# Patient Record
Sex: Male | Born: 1950 | Race: Black or African American | Hispanic: No | Marital: Single | Smoking: Current every day smoker
Health system: Southern US, Community
[De-identification: ages and names within clinical notes are randomized; demographics above are authoritative.]

## PROBLEM LIST (undated history)

## (undated) DIAGNOSIS — K852 Alcohol induced acute pancreatitis without necrosis or infection: Secondary | ICD-10-CM

## (undated) DIAGNOSIS — D649 Anemia, unspecified: Secondary | ICD-10-CM

## (undated) DIAGNOSIS — C801 Malignant (primary) neoplasm, unspecified: Secondary | ICD-10-CM

## (undated) DIAGNOSIS — S42302A Unspecified fracture of shaft of humerus, left arm, initial encounter for closed fracture: Secondary | ICD-10-CM

## (undated) DIAGNOSIS — M1990 Unspecified osteoarthritis, unspecified site: Secondary | ICD-10-CM

## (undated) DIAGNOSIS — K8689 Other specified diseases of pancreas: Secondary | ICD-10-CM

## (undated) DIAGNOSIS — M722 Plantar fascial fibromatosis: Secondary | ICD-10-CM

## (undated) DIAGNOSIS — I10 Essential (primary) hypertension: Secondary | ICD-10-CM

## (undated) HISTORY — DX: Alcohol induced acute pancreatitis without necrosis or infection: K85.20

## (undated) HISTORY — PX: PROSTATE BIOPSY: SHX241

## (undated) HISTORY — DX: Other specified diseases of pancreas: K86.89

## (undated) HISTORY — DX: Anemia, unspecified: D64.9

## (undated) HISTORY — DX: Plantar fascial fibromatosis: M72.2

---

## 1999-12-06 ENCOUNTER — Emergency Department (HOSPITAL_COMMUNITY): Admission: EM | Admit: 1999-12-06 | Discharge: 1999-12-06 | Payer: Self-pay | Admitting: Emergency Medicine

## 1999-12-06 ENCOUNTER — Encounter: Payer: Self-pay | Admitting: Emergency Medicine

## 2002-02-01 ENCOUNTER — Emergency Department (HOSPITAL_COMMUNITY): Admission: EM | Admit: 2002-02-01 | Discharge: 2002-02-01 | Payer: Self-pay | Admitting: Emergency Medicine

## 2002-02-01 ENCOUNTER — Encounter: Payer: Self-pay | Admitting: Emergency Medicine

## 2005-05-20 ENCOUNTER — Emergency Department (HOSPITAL_COMMUNITY): Admission: EM | Admit: 2005-05-20 | Discharge: 2005-05-20 | Payer: Self-pay | Admitting: Emergency Medicine

## 2006-07-08 ENCOUNTER — Emergency Department (HOSPITAL_COMMUNITY): Admission: EM | Admit: 2006-07-08 | Discharge: 2006-07-09 | Payer: Self-pay | Admitting: Emergency Medicine

## 2008-03-24 ENCOUNTER — Emergency Department (HOSPITAL_COMMUNITY): Admission: EM | Admit: 2008-03-24 | Discharge: 2008-03-24 | Payer: Self-pay | Admitting: Emergency Medicine

## 2009-10-10 ENCOUNTER — Encounter: Admission: RE | Admit: 2009-10-10 | Discharge: 2009-10-10 | Payer: Self-pay | Admitting: Family Medicine

## 2013-03-11 ENCOUNTER — Encounter (HOSPITAL_COMMUNITY): Payer: Self-pay | Admitting: Emergency Medicine

## 2013-03-11 ENCOUNTER — Emergency Department (HOSPITAL_COMMUNITY): Payer: Self-pay

## 2013-03-11 ENCOUNTER — Emergency Department (HOSPITAL_COMMUNITY)
Admission: EM | Admit: 2013-03-11 | Discharge: 2013-03-11 | Disposition: A | Discharge status: Home / Self Care | Payer: Self-pay | Attending: Emergency Medicine | Admitting: Emergency Medicine

## 2013-03-11 DIAGNOSIS — Y9301 Activity, walking, marching and hiking: Secondary | ICD-10-CM | POA: Insufficient documentation

## 2013-03-11 DIAGNOSIS — S52502A Unspecified fracture of the lower end of left radius, initial encounter for closed fracture: Secondary | ICD-10-CM

## 2013-03-11 DIAGNOSIS — Y929 Unspecified place or not applicable: Secondary | ICD-10-CM | POA: Insufficient documentation

## 2013-03-11 DIAGNOSIS — S52599A Other fractures of lower end of unspecified radius, initial encounter for closed fracture: Secondary | ICD-10-CM | POA: Insufficient documentation

## 2013-03-11 DIAGNOSIS — M129 Arthropathy, unspecified: Secondary | ICD-10-CM | POA: Insufficient documentation

## 2013-03-11 DIAGNOSIS — R296 Repeated falls: Secondary | ICD-10-CM | POA: Insufficient documentation

## 2013-03-11 DIAGNOSIS — F17200 Nicotine dependence, unspecified, uncomplicated: Secondary | ICD-10-CM | POA: Insufficient documentation

## 2013-03-11 DIAGNOSIS — R21 Rash and other nonspecific skin eruption: Secondary | ICD-10-CM | POA: Insufficient documentation

## 2013-03-11 HISTORY — DX: Unspecified osteoarthritis, unspecified site: M19.90

## 2013-03-11 MED ORDER — BUPIVACAINE HCL (PF) 0.5 % IJ SOLN
INTRAMUSCULAR | Status: AC
  Filled 2013-03-11: qty 30

## 2013-03-11 MED ORDER — OXYCODONE-ACETAMINOPHEN 5-325 MG PO TABS: 1.0000 | ORAL_TABLET | Freq: Four times a day (QID) | ORAL | Status: DC | PRN

## 2013-03-11 MED ORDER — MORPHINE SULFATE 4 MG/ML IJ SOLN
4.0000 mg | Freq: Once | INTRAMUSCULAR | Status: AC
  Administered 2013-03-11: 4 mg via INTRAVENOUS
  Filled 2013-03-11: qty 1

## 2013-03-11 MED ORDER — BUPIVACAINE HCL (PF) 0.5 % IJ SOLN
30.0000 mL | Freq: Once | INTRAMUSCULAR | Status: AC
  Administered 2013-03-11: 30 mL

## 2013-03-11 NOTE — ED Notes (Signed)
WUJ:[id number]<JY> Expected date:<BR> Expected time:<BR> Means of arrival:<BR> Comments:<BR> Hold for Rada

## 2013-03-11 NOTE — ED Notes (Signed)
Pt c/o l wrist injury.  Reports falling and landing on hands.  Obvious wrist deformity and swelling noted to L wrist.  Pt is able to move fingers.

## 2013-03-11 NOTE — ED Notes (Signed)
Patient is alert and oriented x3.  He was given DC instructions and follow up visit instructions.  Patient gave verbal understanding.  He was DC ambulatory under his own power to home.  V/S stable.  He was not showing any signs of distress on DC 

## 2013-03-11 NOTE — ED Provider Notes (Signed)
History    CSN: [id number] Arrival date & time 03/11/13  1948  First MD Initiated Contact with Patient 03/11/13 1955     Chief Complaint  Patient presents with  . Wrist Injury   (Consider location/radiation/quality/duration/timing/severity/associated sxs/prior Treatment) Patient is a 62 y.o. male presenting with wrist injury. The history is provided by the patient and the EMS personnel.  Wrist Injury Associated symptoms: no back pain and no fatigue    patient states that he was eating chicken and walking backwards and fell and landed on his arm. He has a deformity. He states he did not hit his head. He has had a few beers today. No other injury. No chest or neck pain. He is not on any anticoagulation. No numbness or weakness. Past Medical History  Diagnosis Date  . Arthritis    History reviewed. No pertinent past surgical history. No family history on file. History  Substance Use Topics  . Smoking status: Current Every Day Smoker  . Smokeless tobacco: Not on file  . Alcohol Use: Yes    Review of Systems  Constitutional: Negative for appetite change and fatigue.  Cardiovascular: Negative for chest pain.  Gastrointestinal: Negative for abdominal pain.  Musculoskeletal: Negative for back pain and joint swelling.  Skin: Positive for rash. Negative for wound.  Neurological: Negative for weakness and numbness.    Allergies  Review of patient's allergies indicates no known allergies.  Home Medications   Current Outpatient Rx  Name  Route  Sig  Dispense  Refill  . oxyCODONE-acetaminophen (PERCOCET/ROXICET) 5-325 MG per tablet   Oral   Take 1-2 tablets by mouth every 6 (six) hours as needed for pain.   12 tablet   0    BP 135/76  Pulse 63  Resp 18  SpO2 97% Physical Exam  Constitutional: He is oriented to person, place, and time. He appears well-developed and well-nourished.  HENT:  Head: Normocephalic.  Eyes: Pupils are equal, round, and reactive to light.   Cardiovascular: Normal rate and regular rhythm.   Pulmonary/Chest: Effort normal and breath sounds normal.  Abdominal: There is no tenderness.  Musculoskeletal: He exhibits tenderness.  Dorsal angulation of distal left radius. Sensation intact in hand over radial median and ulnar distribution. Good capillary refill. Skin is intact no tenderness over elbow. No tenderness over shoulder.  Neurological: He is alert and oriented to person, place, and time.  Skin: Skin is warm.    ED Course  ORTHOPEDIC INJURY TREATMENT Date/Time: 03/11/2013 11:09 PM Performed by: Benjiman Core R. Authorized by: Billee Cashing Consent: Verbal consent obtained. written consent not obtained. Risks and benefits: risks, benefits and alternatives were discussed Consent given by: patient Patient understanding: patient states understanding of the procedure being performed Patient consent: the patient's understanding of the procedure matches consent given Procedure consent: procedure consent matches procedure scheduled Relevant documents: relevant documents present and verified Test results: test results available and properly labeled Site marked: the operative site was marked Imaging studies: imaging studies available Required items: required blood products, implants, devices, and special equipment available Patient identity confirmed: verbally with patient and arm band Time out: Immediately prior to procedure a "time out" was called to verify the correct patient, procedure, equipment, support staff and site/side marked as required. Injury location: wrist Location details: left wrist Injury type: fracture Pre-procedure neurovascular assessment: neurovascularly intact Pre-procedure distal perfusion: normal Pre-procedure neurological function: normal Pre-procedure range of motion: reduced Local anesthesia used: yes Anesthesia: hematoma block Local anesthetic: bupivacaine 0.5%  without  epinephrine Anesthetic total: 3 ml Patient sedated: no Manipulation performed: yes Reduction successful: yes X-ray confirmed reduction: yes Immobilization: splint Splint type: sugar tong Post-procedure neurovascular assessment: post-procedure neurovascularly intact Post-procedure distal perfusion: normal Post-procedure neurological function: normal Post-procedure range of motion: improved Patient tolerance: Patient tolerated the procedure well with no immediate complications.   (including critical care time) Labs Reviewed - No data to display Dg Wrist Complete Left  03/11/2013   *RADIOLOGY REPORT*  Clinical Data: And left wrist.  LEFT WRIST - COMPLETE 3+ VIEW  Comparison: Radiography from earlier the same day.  Findings: Interval application of a splint.  Fracture impaction is much improved, as is  radial inclination.  There is mild dorsal tilting, much improved.  No new injury appreciated.  IMPRESSION: Interarticular distal radius fracture alignment is markedly improved after reduction and splinting.   Original Report Authenticated By: Tiburcio Pea   Dg Wrist Complete Left  03/11/2013   *RADIOLOGY REPORT*  Clinical Data: Left wrist.  Deformity.  LEFT WRIST - COMPLETE 3+ VIEW  Comparison: None.  Findings: Comminuted intra-articular distal radius fracture with dorsal angulation.  No other visible fractures.  Probable mild osseous demineralization.  IMPRESSION: Comminuted intra-articular distal radius fracture with dorsal angulation.   Original Report Authenticated By: Hulan Saas, M.D.   1. Distal radius fracture, left, closed, initial encounter     MDM  Patient with left intra-articular distal radius fracture. It is moderately displaced.  It was reduced and splinted in the ED. Will followup either with Dr. Merlyn Lot, who was on call, or Dr. Melvyn Novas who dealt with the previous wrist fracture for him.    Juliet Rude. Rubin Payor, MD 03/11/13 2311

## 2013-04-13 ENCOUNTER — Emergency Department (HOSPITAL_COMMUNITY): Payer: Self-pay

## 2013-04-13 ENCOUNTER — Encounter (HOSPITAL_COMMUNITY): Payer: Self-pay | Admitting: Nurse Practitioner

## 2013-04-13 ENCOUNTER — Emergency Department (HOSPITAL_COMMUNITY)
Admission: EM | Admit: 2013-04-13 | Discharge: 2013-04-13 | Disposition: A | Discharge status: Home / Self Care | Payer: Self-pay | Attending: Emergency Medicine | Admitting: Emergency Medicine

## 2013-04-13 DIAGNOSIS — F17200 Nicotine dependence, unspecified, uncomplicated: Secondary | ICD-10-CM | POA: Insufficient documentation

## 2013-04-13 DIAGNOSIS — S5290XD Unspecified fracture of unspecified forearm, subsequent encounter for closed fracture with routine healing: Secondary | ICD-10-CM | POA: Insufficient documentation

## 2013-04-13 DIAGNOSIS — S52502D Unspecified fracture of the lower end of left radius, subsequent encounter for closed fracture with routine healing: Secondary | ICD-10-CM

## 2013-04-13 DIAGNOSIS — Z8739 Personal history of other diseases of the musculoskeletal system and connective tissue: Secondary | ICD-10-CM | POA: Insufficient documentation

## 2013-04-13 HISTORY — DX: Unspecified fracture of shaft of humerus, left arm, initial encounter for closed fracture: S42.302A

## 2013-04-13 NOTE — ED Notes (Signed)
I gave the patient a cup of coffee and 2 sugars.

## 2013-04-13 NOTE — ED Provider Notes (Signed)
CSN: [id number]     Arrival date & time 04/13/13  1051 History     First MD Initiated Contact with Patient 04/13/13 1153     Chief Complaint  Patient presents with  . Cast Problem   (Consider location/radiation/quality/duration/timing/severity/associated sxs/prior Treatment) HPI Comments: Patient returns today for follow up for his left distal radius fracture 7/17.  States he was supposed to follow up with Dr Melvyn Novas but could not because he was asked for $250 up front to be seen.  States he has no pain, no weakness or numbness, and wants to see if it is healed.   The history is provided by the patient.    Past Medical History  Diagnosis Date  . Arthritis    History reviewed. No pertinent past surgical history. History reviewed. No pertinent family history. History  Substance Use Topics  . Smoking status: Current Every Day Smoker    Types: Cigarettes  . Smokeless tobacco: Not on file  . Alcohol Use: Yes    Review of Systems  Skin: Negative for color change.  Neurological: Negative for weakness and numbness.    Allergies  Review of patient's allergies indicates no known allergies.  Home Medications   Current Outpatient Rx  Name  Route  Sig  Dispense  Refill  . oxyCODONE-acetaminophen (PERCOCET/ROXICET) 5-325 MG per tablet   Oral   Take 1-2 tablets by mouth every 6 (six) hours as needed for pain.   12 tablet   0    BP 143/85  Pulse 76  Temp(Src) 98.3 F (36.8 C) (Oral)  Resp 15  Ht 6\' 3"  (1.905 m)  Wt 170 lb (77.111 kg)  BMI 21.25 kg/m2  SpO2 99% Physical Exam  Nursing note and vitals reviewed. Constitutional: He appears well-developed and well-nourished. No distress.  HENT:  Head: Normocephalic and atraumatic.  Neck: Neck supple.  Pulmonary/Chest: Effort normal.  Musculoskeletal:  Left fingers with full AROM without swelling.  Sensation intact.  Capillary refill < 2 seconds.   Neurological: He is alert.  Skin: He is not diaphoretic.  Skin of left  forearm and wrist under splint is intact.  No erythema, edema, break in skin, warmth.      ED Course   Procedures (including critical care time)  Labs Reviewed - No data to display Dg Wrist Complete Left  04/13/2013   *RADIOLOGY REPORT*  Clinical Data: Follow-up fracture.  Cast problem.  LEFT WRIST - COMPLETE 3+ VIEW  Comparison: 03/11/2013.  Findings: Cast material overlies an impacted distal radius fracture.  Fracture fragments are in a similar anatomic alignment with some blurring of the fracture margins and bridging callus formation.  Degenerative changes are seen in the first carpometacarpal and scaphoid trapezium trapezoid joints.  IMPRESSION: Impacted distal radius fracture without change in alignment, and some interval evidence of healing.   Original Report Authenticated By: Leanna Battles, M.D.   1. Distal radius fracture, left, closed, with routine healing, subsequent encounter     MDM  Pt with fracture seen in ED last month, has not followed up with hand because he cannot afford it.  No complaints.  Per xray, bones are healing.  Splint removed and replaced.  Pt first attempted to follow up with Dr Melvyn Novas because he had operated on his right wrist previously.  Dr Merlyn Lot was actually on call the night patient was seen.  D/C home with hand follow up Merlyn Lot).  Discussed all results with patient.  Pt given return precautions.  Pt verbalizes understanding and agrees  with plan.      Trixie Dredge, PA-C 04/13/13 1456

## 2013-04-13 NOTE — Progress Notes (Signed)
Orthopedic Tech Progress Note Patient Details:  Logan Chambers 1951-06-26 [id number]  Ortho Devices Type of Ortho Device: Sugartong splint;Ace wrap Ortho Device/Splint Interventions: Application   [name], [name] 04/13/2013, 1:47 PM

## 2013-04-13 NOTE — ED Notes (Signed)
Pt had cast placed for LFA fracture last month, has been unable to f/u with ortho because he has no money to get appt. Pt wants cast checked today to see if its ready to remove. Denies any pain.

## 2013-04-13 NOTE — ED Notes (Signed)
PT said he was heading to cafeteria; encouraged to stay until name called. Pt sitting in waiting room now.

## 2013-04-13 NOTE — ED Provider Notes (Signed)
Medical screening examination/treatment/procedure(s) were performed by non-physician practitioner and as supervising physician I was immediately available for consultation/collaboration.  Flint Melter, MD 04/13/13 [phone]

## 2013-05-06 ENCOUNTER — Ambulatory Visit: Payer: Self-pay | Attending: Orthopedic Surgery | Admitting: Occupational Therapy

## 2013-05-06 DIAGNOSIS — M2560 Stiffness of unspecified joint, not elsewhere classified: Secondary | ICD-10-CM | POA: Insufficient documentation

## 2013-05-06 DIAGNOSIS — M25549 Pain in joints of unspecified hand: Secondary | ICD-10-CM | POA: Insufficient documentation

## 2013-05-06 DIAGNOSIS — M6281 Muscle weakness (generalized): Secondary | ICD-10-CM | POA: Insufficient documentation

## 2013-05-06 DIAGNOSIS — IMO0001 Reserved for inherently not codable concepts without codable children: Secondary | ICD-10-CM | POA: Insufficient documentation

## 2013-05-13 ENCOUNTER — Ambulatory Visit: Payer: Self-pay | Admitting: Occupational Therapy

## 2013-05-20 ENCOUNTER — Ambulatory Visit: Payer: Self-pay | Admitting: Occupational Therapy

## 2013-05-26 ENCOUNTER — Ambulatory Visit: Payer: Self-pay | Attending: Orthopedic Surgery | Admitting: Occupational Therapy

## 2013-05-26 DIAGNOSIS — IMO0001 Reserved for inherently not codable concepts without codable children: Secondary | ICD-10-CM | POA: Insufficient documentation

## 2013-05-26 DIAGNOSIS — M6281 Muscle weakness (generalized): Secondary | ICD-10-CM | POA: Insufficient documentation

## 2013-05-26 DIAGNOSIS — M25549 Pain in joints of unspecified hand: Secondary | ICD-10-CM | POA: Insufficient documentation

## 2013-05-26 DIAGNOSIS — M2560 Stiffness of unspecified joint, not elsewhere classified: Secondary | ICD-10-CM | POA: Insufficient documentation

## 2013-06-03 ENCOUNTER — Ambulatory Visit: Payer: Self-pay | Admitting: Occupational Therapy

## 2013-06-10 ENCOUNTER — Ambulatory Visit: Payer: Self-pay | Admitting: Occupational Therapy

## 2013-06-17 ENCOUNTER — Ambulatory Visit: Payer: Self-pay | Admitting: Occupational Therapy

## 2013-06-22 ENCOUNTER — Ambulatory Visit: Payer: Self-pay | Admitting: Occupational Therapy

## 2013-06-28 ENCOUNTER — Ambulatory Visit: Payer: Self-pay | Attending: Orthopedic Surgery | Admitting: Occupational Therapy

## 2013-06-28 DIAGNOSIS — M2560 Stiffness of unspecified joint, not elsewhere classified: Secondary | ICD-10-CM | POA: Insufficient documentation

## 2013-06-28 DIAGNOSIS — IMO0001 Reserved for inherently not codable concepts without codable children: Secondary | ICD-10-CM | POA: Insufficient documentation

## 2013-06-28 DIAGNOSIS — M25549 Pain in joints of unspecified hand: Secondary | ICD-10-CM | POA: Insufficient documentation

## 2013-06-28 DIAGNOSIS — M6281 Muscle weakness (generalized): Secondary | ICD-10-CM | POA: Insufficient documentation

## 2016-10-02 DIAGNOSIS — F17200 Nicotine dependence, unspecified, uncomplicated: Secondary | ICD-10-CM | POA: Diagnosis not present

## 2016-10-02 DIAGNOSIS — Z681 Body mass index (BMI) 19 or less, adult: Secondary | ICD-10-CM | POA: Diagnosis not present

## 2016-10-04 ENCOUNTER — Other Ambulatory Visit: Payer: Self-pay | Admitting: Internal Medicine

## 2016-10-04 DIAGNOSIS — F17200 Nicotine dependence, unspecified, uncomplicated: Secondary | ICD-10-CM

## 2016-10-08 ENCOUNTER — Ambulatory Visit
Admission: RE | Admit: 2016-10-08 | Discharge: 2016-10-08 | Disposition: A | Discharge status: Home / Self Care | Payer: Medicare HMO | Source: Ambulatory Visit | Attending: Internal Medicine | Admitting: Internal Medicine

## 2016-10-08 DIAGNOSIS — F17200 Nicotine dependence, unspecified, uncomplicated: Secondary | ICD-10-CM

## 2016-10-08 DIAGNOSIS — F17210 Nicotine dependence, cigarettes, uncomplicated: Secondary | ICD-10-CM | POA: Diagnosis not present

## 2016-10-22 DIAGNOSIS — Z125 Encounter for screening for malignant neoplasm of prostate: Secondary | ICD-10-CM | POA: Diagnosis not present

## 2016-10-22 DIAGNOSIS — Z0000 Encounter for general adult medical examination without abnormal findings: Secondary | ICD-10-CM | POA: Diagnosis not present

## 2016-10-29 DIAGNOSIS — Z0000 Encounter for general adult medical examination without abnormal findings: Secondary | ICD-10-CM | POA: Diagnosis not present

## 2016-10-29 DIAGNOSIS — R972 Elevated prostate specific antigen [PSA]: Secondary | ICD-10-CM | POA: Diagnosis not present

## 2016-10-29 DIAGNOSIS — Z681 Body mass index (BMI) 19 or less, adult: Secondary | ICD-10-CM | POA: Diagnosis not present

## 2016-10-29 DIAGNOSIS — F17200 Nicotine dependence, unspecified, uncomplicated: Secondary | ICD-10-CM | POA: Diagnosis not present

## 2016-10-29 DIAGNOSIS — R9431 Abnormal electrocardiogram [ECG] [EKG]: Secondary | ICD-10-CM | POA: Diagnosis not present

## 2016-10-29 DIAGNOSIS — I10 Essential (primary) hypertension: Secondary | ICD-10-CM | POA: Diagnosis not present

## 2016-11-01 DIAGNOSIS — K5730 Diverticulosis of large intestine without perforation or abscess without bleeding: Secondary | ICD-10-CM | POA: Diagnosis not present

## 2016-11-01 DIAGNOSIS — D126 Benign neoplasm of colon, unspecified: Secondary | ICD-10-CM | POA: Diagnosis not present

## 2016-11-01 DIAGNOSIS — Z1211 Encounter for screening for malignant neoplasm of colon: Secondary | ICD-10-CM | POA: Diagnosis not present

## 2016-11-05 DIAGNOSIS — Z1211 Encounter for screening for malignant neoplasm of colon: Secondary | ICD-10-CM | POA: Diagnosis not present

## 2016-11-05 DIAGNOSIS — Z720 Tobacco use: Secondary | ICD-10-CM | POA: Diagnosis not present

## 2016-11-05 DIAGNOSIS — D126 Benign neoplasm of colon, unspecified: Secondary | ICD-10-CM | POA: Diagnosis not present

## 2016-11-05 DIAGNOSIS — Z136 Encounter for screening for cardiovascular disorders: Secondary | ICD-10-CM | POA: Diagnosis not present

## 2017-03-24 ENCOUNTER — Encounter (HOSPITAL_BASED_OUTPATIENT_CLINIC_OR_DEPARTMENT_OTHER): Payer: Self-pay | Admitting: Emergency Medicine

## 2017-03-24 ENCOUNTER — Emergency Department (HOSPITAL_BASED_OUTPATIENT_CLINIC_OR_DEPARTMENT_OTHER)
Admission: EM | Admit: 2017-03-24 | Discharge: 2017-03-24 | Disposition: A | Discharge status: Home / Self Care | Payer: Medicare Other | Attending: Emergency Medicine | Admitting: Emergency Medicine

## 2017-03-24 ENCOUNTER — Emergency Department (HOSPITAL_BASED_OUTPATIENT_CLINIC_OR_DEPARTMENT_OTHER): Payer: Medicare Other

## 2017-03-24 DIAGNOSIS — Y929 Unspecified place or not applicable: Secondary | ICD-10-CM | POA: Insufficient documentation

## 2017-03-24 DIAGNOSIS — F17210 Nicotine dependence, cigarettes, uncomplicated: Secondary | ICD-10-CM | POA: Insufficient documentation

## 2017-03-24 DIAGNOSIS — S42292A Other displaced fracture of upper end of left humerus, initial encounter for closed fracture: Secondary | ICD-10-CM

## 2017-03-24 DIAGNOSIS — W0110XA Fall on same level from slipping, tripping and stumbling with subsequent striking against unspecified object, initial encounter: Secondary | ICD-10-CM | POA: Insufficient documentation

## 2017-03-24 DIAGNOSIS — Y9301 Activity, walking, marching and hiking: Secondary | ICD-10-CM | POA: Insufficient documentation

## 2017-03-24 DIAGNOSIS — S00531A Contusion of lip, initial encounter: Secondary | ICD-10-CM | POA: Diagnosis not present

## 2017-03-24 DIAGNOSIS — S42295A Other nondisplaced fracture of upper end of left humerus, initial encounter for closed fracture: Secondary | ICD-10-CM | POA: Insufficient documentation

## 2017-03-24 DIAGNOSIS — W19XXXA Unspecified fall, initial encounter: Secondary | ICD-10-CM

## 2017-03-24 DIAGNOSIS — Y999 Unspecified external cause status: Secondary | ICD-10-CM | POA: Diagnosis not present

## 2017-03-24 DIAGNOSIS — S4992XA Unspecified injury of left shoulder and upper arm, initial encounter: Secondary | ICD-10-CM | POA: Diagnosis present

## 2017-03-24 MED ORDER — ACETAMINOPHEN 500 MG PO TABS: 1000.0000 mg | ORAL_TABLET | Freq: Three times a day (TID) | ORAL | 0 refills | Status: AC

## 2017-03-24 MED ORDER — ACETAMINOPHEN 500 MG PO TABS
1000.0000 mg | ORAL_TABLET | Freq: Once | ORAL | Status: AC
  Administered 2017-03-24: 1000 mg via ORAL
  Filled 2017-03-24: qty 2

## 2017-03-24 MED ORDER — METHOCARBAMOL 500 MG PO TABS: 1000.0000 mg | ORAL_TABLET | Freq: Every evening | ORAL | 0 refills | Status: DC | PRN

## 2017-03-24 MED ORDER — HYDROCODONE-ACETAMINOPHEN 5-325 MG PO TABS
1.0000 | ORAL_TABLET | Freq: Three times a day (TID) | ORAL | 0 refills | Status: AC | PRN
Start: 2017-03-24 — End: 2017-03-29

## 2017-03-24 NOTE — ED Notes (Signed)
Patient transported to X-ray 

## 2017-03-24 NOTE — ED Triage Notes (Signed)
patient states that he fell about 30 minutes ago. Left shoulder pain

## 2017-03-24 NOTE — ED Provider Notes (Signed)
[facility] Provider Note   CSN: [id number] Arrival date & time: 03/24/17  0918     History   Chief Complaint Chief Complaint  Patient presents with  . Shoulder Pain    HPI Logan Chambers is a 66 y.o. male.  HPI  66 year old male with a history of arthritis presents to the emergency department with constant left shoulder pain following a mechanical fall approximately 30 minutes prior to arrival. He reports that he was stripping floors and slipped, falling forward onto his left elbow and shoulder. Reports hitting his lip on the way down but denies any head trauma or loss of consciousness. Currently denies any other physical complaints. Pain is exacerbated with palpation and range of motion of the left shoulder. Pain is alleviated with immobility. No other alleviating or given factors.  Patient denies any anticoagulation or current headache.  Past Medical History:  Diagnosis Date  . Arm fracture, left   . Arthritis     There are no active problems to display for this patient.   History reviewed. No pertinent surgical history.     Home Medications    Prior to Admission medications   Medication Sig Start Date End Date Taking? Authorizing Provider  acetaminophen (TYLENOL) 500 MG tablet Take 2 tablets (1,000 mg total) by mouth every 8 (eight) hours. Do not take more than 4000 mg of acetaminophen (Tylenol) in a 24-hour period. Please note that other medicines that you may be prescribed may have Tylenol as well. 03/24/17 03/29/17  Fatima Blank, MD  HYDROcodone-acetaminophen (NORCO/VICODIN) 5-325 MG tablet Take 1 tablet by mouth every 8 (eight) hours as needed for severe pain (That is not improved by your scheduled acetaminophen regimen). Please do not exceed 4000 mg of acetaminophen (Tylenol) a 24-hour period. Please note that he may be prescribed additional medicine that contains acetaminophen. 03/24/17 03/29/17  Fatima Blank, MD  methocarbamol  (ROBAXIN) 500 MG tablet Take 2 tablets (1,000 mg total) by mouth at bedtime as needed for muscle spasms. 03/24/17   Fatima Blank, MD    Family History No family history on file.  Social History Social History  Substance Use Topics  . Smoking status: Current Every Day Smoker    Types: Cigarettes  . Smokeless tobacco: Never Used  . Alcohol use Yes     Allergies   Patient has no known allergies.   Review of Systems Review of Systems All other systems are reviewed and are negative for acute change except as noted in the HPI   Physical Exam Updated Vital Signs BP (!) 143/75 (BP Location: Right Arm)   Pulse (!) 51   Temp (!) 97 F (36.1 C) (Oral)   Resp 18   Ht 6' (1.829 m)   Wt 74.8 kg (165 lb)   SpO2 100%   BMI 22.38 kg/m   Physical Exam  Constitutional: He is oriented to person, place, and time. He appears well-developed and well-nourished. No distress.  HENT:  Head: Normocephalic. Head is without raccoon's eyes, without Battle's sign and without laceration.  Right Ear: External ear normal.  Left Ear: External ear normal.  Mouth/Throat: Oropharynx is clear and moist. No trismus in the jaw. Abnormal dentition.    Eyes: Pupils are equal, round, and reactive to light. Conjunctivae and EOM are normal. Right eye exhibits no discharge. Left eye exhibits no discharge. No scleral icterus.  Neck: Normal range of motion. Neck supple.  Cardiovascular: Regular rhythm and normal heart sounds.  Exam reveals no  gallop and no friction rub.   No murmur heard. Pulses:      Radial pulses are 2+ on the right side, and 2+ on the left side.       Dorsalis pedis pulses are 2+ on the right side, and 2+ on the left side.  Pulmonary/Chest: Effort normal and breath sounds normal. No stridor. No respiratory distress.  Abdominal: Soft. He exhibits no distension. There is no tenderness.  Musculoskeletal:       Left shoulder: He exhibits decreased range of motion, tenderness, bony  tenderness, swelling and pain. He exhibits normal pulse and normal strength.       Left elbow: He exhibits no swelling and no effusion. No tenderness found.       Left wrist: He exhibits no tenderness.       Cervical back: He exhibits no tenderness and no bony tenderness.       Thoracic back: He exhibits no bony tenderness.       Lumbar back: He exhibits no bony tenderness.       Left forearm: He exhibits no tenderness.  Clavicle stable. Chest stable to AP/Lat compression. Pelvis stable to Lat compression. No obvious extremity deformity. No chest or abdominal wall contusion.  Neurological: He is alert and oriented to person, place, and time. GCS eye subscore is 4. GCS verbal subscore is 5. GCS motor subscore is 6.  Moving all extremities   Skin: Skin is warm. He is not diaphoretic.     ED Treatments / Results  Labs (all labs ordered are listed, but only abnormal results are displayed) Labs Reviewed - No data to display  EKG  EKG Interpretation None       Radiology Dg Shoulder Left  Result Date: 03/24/2017 CLINICAL DATA:  Left shoulder pain and deformity secondary to a fall this morning. EXAM: LEFT SHOULDER - 2+ VIEW COMPARISON:  None. FINDINGS: There is a comminuted slightly impacted fracture of the subcapital region of the proximal left humerus. The fracture also involves the greater tuberosity. No dislocation. Slight arthritic changes of the AC joint. IMPRESSION: Comminuted impacted fracture of the proximal left humerus. Electronically Signed   By: Lorriane Shire M.D.   On: 03/24/2017 09:47    Procedures Procedures (including critical care time)  Medications Ordered in ED Medications  acetaminophen (TYLENOL) tablet 1,000 mg (1,000 mg Oral Given 03/24/17 1014)     Initial Impression / Assessment and Plan / ED Course  I have reviewed the triage vital signs and the nursing notes.  Pertinent labs & imaging results that were available during my care of the patient were  reviewed by me and considered in my medical decision making (see chart for details).     Comminuted left humeral head fracture. No other injuries noted on exam. Minor closed head injury. Normal neuro exam. no anticoagulation. no indication for imaging at this time.   Doubt intracranial bleed or skull fracture.   Will monitor in the ED for any changes.  After 2 hrs of monitoring, there were no changes that would warrant imaging or admission for further observation. The patient is appropriate for discharge home.  Pt given information on head trauma including strict instructions to return for nausea/vomiting, confusion, altered level of consciousness or new weakness. Pt and family understand and are agreeable to the plan.    Final Clinical Impressions(s) / ED Diagnoses   Final diagnoses:  Humeral head fracture, left, closed, initial encounter  Fall, initial encounter  Contusion of lip, initial  encounter    Disposition: Discharge  Condition: Good  I have discussed the results, Dx and Tx plan with the patient who expressed understanding and agree(s) with the plan. Discharge instructions discussed at great length. The patient was given strict return precautions who verbalized understanding of the instructions. No further questions at time of discharge.    New Prescriptions   ACETAMINOPHEN (TYLENOL) 500 MG TABLET    Take 2 tablets (1,000 mg total) by mouth every 8 (eight) hours. Do not take more than 4000 mg of acetaminophen (Tylenol) in a 24-hour period. Please note that other medicines that you may be prescribed may have Tylenol as well.   HYDROCODONE-ACETAMINOPHEN (NORCO/VICODIN) 5-325 MG TABLET    Take 1 tablet by mouth every 8 (eight) hours as needed for severe pain (That is not improved by your scheduled acetaminophen regimen). Please do not exceed 4000 mg of acetaminophen (Tylenol) a 24-hour period. Please note that he may be prescribed additional medicine that contains  acetaminophen.   METHOCARBAMOL (ROBAXIN) 500 MG TABLET    Take 2 tablets (1,000 mg total) by mouth at bedtime as needed for muscle spasms.    Follow Up: Velna Hatchet, [place] [address] [phone]  Schedule an appointment as soon as possible for a visit  As needed  Renette Butters, MD [address] [address] [phone]  Schedule an appointment as soon as possible for a visit  For close follow up to assess for left arm fracture      Zeenat Jeanbaptiste, Grayce Sessions, MD 03/24/17 1020

## 2017-05-08 ENCOUNTER — Ambulatory Visit: Payer: Medicare Other | Attending: Orthopedic Surgery | Admitting: Physical Therapy

## 2017-05-08 ENCOUNTER — Encounter: Payer: Self-pay | Admitting: Physical Therapy

## 2017-05-08 DIAGNOSIS — M25612 Stiffness of left shoulder, not elsewhere classified: Secondary | ICD-10-CM | POA: Diagnosis present

## 2017-05-08 DIAGNOSIS — G8929 Other chronic pain: Secondary | ICD-10-CM | POA: Insufficient documentation

## 2017-05-08 DIAGNOSIS — M6281 Muscle weakness (generalized): Secondary | ICD-10-CM | POA: Diagnosis present

## 2017-05-08 DIAGNOSIS — M25512 Pain in left shoulder: Secondary | ICD-10-CM | POA: Diagnosis not present

## 2017-05-08 NOTE — Therapy (Signed)
[place], [place], [address] Phone: [phone]   Fax:  [phone]  Physical Therapy Evaluation  Patient Details  Name: Logan Chambers MRN: [id number] Date of Birth: 09/09/1950 Referring Provider: Dr Edmonia Lynch   Encounter Date: 05/08/2017      PT End of Session - 05/08/17 1544    Visit Number 1   Number of Visits 16   Date for PT Re-Evaluation 07/03/17   Authorization Type Medicare   PT Start Time 0845   PT Stop Time 0928   PT Time Calculation (min) 43 min   Activity Tolerance Patient tolerated treatment well   Behavior During Therapy [facility] for tasks assessed/performed      Past Medical History:  Diagnosis Date  . Arm fracture, left   . Arthritis     History reviewed. No pertinent surgical history.  There were no vitals filed for this visit.       Subjective Assessment - 05/08/17 0852    Subjective Patient fell on 03/24/2017 and fractured his left shoulder. Since that point he reports it has been improving. He has been out of his sling for three week. It is stiff but improving. He will continue to work but he will do a lighter job when he returns to work.    Limitations Lifting   Diagnostic tests X-ray: 05/02/2017: well healing shoulder    Patient Stated Goals Get back to using his shoulder    Currently in Pain? Yes   Pain Score 2   no pain today    Pain Location Shoulder   Pain Orientation Left   Pain Descriptors / Indicators Aching   Pain Type Chronic pain   Pain Radiating Towards none    Pain Onset More than a month ago   Pain Frequency Constant   Aggravating Factors  use of the shoulder    Pain Relieving Factors rest and ice    Effect of Pain on Daily Activities difficulty using his left arm             [facility] PT Assessment - 05/08/17 0001      Assessment   Medical Diagnosis Left shoulder fracture    Referring Provider Dr Edmonia Lynch    Onset Date/Surgical Date  03/24/17   Hand Dominance Right   Next MD Visit 4 weeks    Prior Therapy None      Precautions   Precautions None     Restrictions   Weight Bearing Restrictions No     Balance Screen   Has the patient fallen in the past 6 months Yes   How many times? 1  1st and only fall    Has the patient had a decrease in activity level because of a fear of falling?  No   Is the patient reluctant to leave their home because of a fear of falling?  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    Leisure Nothing      Cognition   Overall Cognitive Status Within Functional Limits for tasks assessed   Attention Focused   Focused Attention Appears intact   Memory Appears intact   Awareness Appears intact   Problem Solving Appears intact     Observation/Other Assessments   Focus on Therapeutic Outcomes (FOTO)  45% limitation  Sensation   Light Touch Appears Intact     Coordination   Gross Motor Movements are Fluid and Coordinated Yes   Fine Motor Movements are Fluid and Coordinated Yes     Posture/Postural Control   Posture Comments forward head      ROM / Strength   AROM / PROM / Strength AROM;PROM;Strength     AROM   Overall AROM Comments full active motion of the right shoulder    AROM Assessment Site Shoulder   Right/Left Shoulder Left   Right Shoulder Flexion 16 Degrees   Right Shoulder ABduction 90 Degrees   Right Shoulder Internal Rotation --  to L4    Right Shoulder External Rotation --  to left shoulder; can get to head if he moves his head      PROM   PROM Assessment Site Shoulder   Right/Left Shoulder Right;Left   Left Shoulder Flexion 136 Degrees   Left Shoulder ABduction 120 Degrees     Strength   Strength Assessment Site Shoulder   Right/Left Shoulder Left     Flexibility   Soft Tissue Assessment /Muscle Length yes             Objective measurements completed on examination: See above findings.          [place] Adult PT Treatment/Exercise - 05/08/17 0001      Shoulder Exercises: Supine   Other Supine Exercises wand flexion x10      Shoulder Exercises: Sidelying   Other Sidelying Exercises sidelying ER left 2x10      Shoulder Exercises: Standing   External Rotation Limitations red x10    Internal Rotation Limitations red x10    Extension Limitations red x10    Row Limitations x10 red      Manual Therapy   Manual therapy comments PROM into flexion IR and ER                PT Education - 05/08/17 1544    Education provided Yes   Education Details symptom mangament; ROM porgression; HEP    Person(s) Educated Patient   Methods Explanation;Demonstration;Tactile cues;Verbal cues   Comprehension Verbalized understanding;Returned demonstration;Verbal cues required;Tactile cues required          PT Short Term Goals - 05/08/17 1554      PT SHORT TERM GOAL #1   Title Patient will increase passive shoulder range of motion to full without pain    Time 4   Period Weeks   Status New     PT SHORT TERM GOAL #2   Title Patient will demsotrate 4+/5 left shoulder strength    Time 4   Period Weeks   Status New     PT SHORT TERM GOAL #3   Title Patient will be independent with inital HEP    Time 4   Period Weeks   Status New           PT Long Term Goals - 05/08/17 1600      PT LONG TERM GOAL #1   Title Patient will reach behind his head without pain in order to perfrom ADL's    Time 8   Period Weeks   Status New     PT LONG TERM GOAL #2   Title Patient will put 2 lb weight into cabinet without pain in order to perfrom IADL's    Time 8   Period Weeks   Status New     PT LONG TERM GOAL #3  Title Patient will demsotrate 5/5 left shoulder grosss strength in order to perform ADL's    Time 8   Period Weeks   Status New                Plan - 05/08/17  1545    Clinical Impression Statement Patient is a 66 year old male S/P Left shoulder subcapital humeral fx. He was treated conservatively. At this time he is fdoing very well. his pain is well controlled. He does have motion deficits and strength deficits but he is motivated to improve strength and motion. Patient will be given HEP for strength and range progression. He will cone back 1x a week for 3-4 weeks to update HEP and work on manual therapy for range. He was seen for a low complexity evaluation.    Clinical Presentation Stable   Clinical Decision Making Low   Rehab Potential Good   PT Frequency 2x / week   PT Duration 8 weeks   PT Treatment/Interventions ADLs/Self Care Home Management;Cryotherapy;Electrical Stimulation;Iontophoresis 4mg /ml Dexamethasone;Stair training;Gait training;Moist Heat;Therapeutic activities;Therapeutic exercise;Patient/family education;Passive range of motion;Dry needling;Splinting;Taping   PT Next Visit Plan passive range of motion and grade I and II joint mobilizations as required; add supine flexion; supin d2 flexio; cabinet lifts with or without weights; Add prone row;    PT Home Exercise Plan shoulder extension; scap retraction; standing ER/ IR; shoulder wnad flexion    Consulted and Agree with Plan of Care Patient      Patient will benefit from skilled therapeutic intervention in order to improve the following deficits and impairments:  Decreased activity tolerance, Pain, Decreased strength, Decreased range of motion, Hypermobility, Impaired UE functional use  Visit Diagnosis: Chronic left shoulder pain - Plan: PT plan of care cert/re-cert  Stiffness of left shoulder, not elsewhere classified - Plan: PT plan of care cert/re-cert  Muscle weakness (generalized) - Plan: PT plan of care cert/re-cert      G-Codes - 63/14/97 1605    Functional Assessment Tool Used (Outpatient Only) FOTO, clinical decision making   Functional Limitation Carrying, moving and  handling objects   Carrying, Moving and Handling Objects Current Status (W2637) At least 20 percent but less than 40 percent impaired, limited or restricted   Carrying, Moving and Handling Objects Goal Status (C5885) At least 1 percent but less than 20 percent impaired, limited or restricted       Problem List There are no active problems to display for this patient.   Carney Living PT DPT  05/08/2017, 4:10 PM  [facility] [address] [place], [place], [address] Phone: [phone]   Fax:  [phone]  Name: Sahmir Weatherbee MRN: [id number] Date of Birth: 06/08/1951

## 2017-05-14 ENCOUNTER — Ambulatory Visit: Payer: Medicare Other | Admitting: Physical Therapy

## 2017-05-14 DIAGNOSIS — M25512 Pain in left shoulder: Principal | ICD-10-CM

## 2017-05-14 DIAGNOSIS — G8929 Other chronic pain: Secondary | ICD-10-CM

## 2017-05-14 DIAGNOSIS — M25612 Stiffness of left shoulder, not elsewhere classified: Secondary | ICD-10-CM

## 2017-05-14 DIAGNOSIS — M6281 Muscle weakness (generalized): Secondary | ICD-10-CM

## 2017-05-15 NOTE — Therapy (Signed)
[place], [place], [address] Phone: [phone]   Fax:  [phone]  Physical Therapy Treatment  Patient Details  Name: Logan Chambers MRN: [id number] Date of Birth: Sep 20, 1950 Referring Provider: Dr Edmonia Lynch   Encounter Date: 05/14/2017      PT End of Session - 05/14/17 0804    Visit Number 2   Number of Visits 16   Date for PT Re-Evaluation 07/03/17   Authorization Type Medicare   PT Start Time 0800   PT Stop Time 0842   PT Time Calculation (min) 42 min   Activity Tolerance Patient tolerated treatment well   Behavior During Therapy [facility] for tasks assessed/performed      Past Medical History:  Diagnosis Date  . Arm fracture, left   . Arthritis     No past surgical history on file.  There were no vitals filed for this visit.      Subjective Assessment - 05/14/17 0803    Subjective Pt reported no pain today. He had a minor ache in her shoilder last weekedn when it was raining. He has been doing his exercises without much pain.    Limitations Lifting   Diagnostic tests X-ray: 05/02/2017: well healing shoulder    Patient Stated Goals Get back to using his shoulder    Currently in Pain? No/denies                         [facility] Adult PT Treatment/Exercise - 05/15/17 0001      Shoulder Exercises: Supine   Other Supine Exercises wand flexion x10    Other Supine Exercises supine flexion 2x10 1lb supine d2 flexion 2x10;      Shoulder Exercises: Standing   External Rotation Limitations yellow 2 x10    Internal Rotation Limitations red 2x10    Extension Limitations red 2x10    Row Limitations 2x10 red    Other Standing Exercises standing wand flexion 2x10      Manual Therapy   Manual therapy comments PROM into flexion IR and ER                PT Education - 05/14/17 0802    Education provided Yes   Education Details reviewed/ updated HEP   Person(s) Educated  Patient   Methods Explanation;Demonstration;Tactile cues;Verbal cues   Comprehension Verbalized understanding;Returned demonstration;Verbal cues required          PT Short Term Goals - 05/15/17 0720      PT SHORT TERM GOAL #1   Title Patient will increase passive shoulder range of motion to full without pain    Time 4   Period Weeks   Status On-going     PT SHORT TERM GOAL #2   Title Patient will demsotrate 4+/5 left shoulder strength    Time 4   Period Weeks   Status On-going     PT SHORT TERM GOAL #3   Title Patient will be independent with inital HEP    Time 4   Period Weeks   Status On-going           PT Long Term Goals - 05/08/17 1600      PT LONG TERM GOAL #1   Title Patient will reach behind his head without pain in order to perfrom ADL's    Time 8   Period Weeks   Status New     PT LONG TERM GOAL #2   Title  Patient will put 2 lb weight into cabinet without pain in order to perfrom IADL's    Time 8   Period Weeks   Status New     PT LONG TERM GOAL #3   Title Patient will demsotrate 5/5 left shoulder grosss strength in order to perform ADL's    Time 8   Period Weeks   Status New               Plan - 05/15/17 8264    Clinical Impression Statement Patient is doing very well. He hasd some fatigue and minor posterior soreness in his shoulder with exercises but otherwise he tolerated treatment well. Therapy added supine sttrengthening exercises in. He was not given new exercises from home 2nd to requiring mod to max cuing for technique.    Clinical Presentation Stable   Clinical Decision Making Low   Rehab Potential Good   PT Frequency 2x / week   PT Duration 8 weeks   PT Treatment/Interventions ADLs/Self Care Home Management;Cryotherapy;Electrical Stimulation;Iontophoresis 4mg /ml Dexamethasone;Stair training;Gait training;Moist Heat;Therapeutic activities;Therapeutic exercise;Patient/family education;Passive range of motion;Dry  needling;Splinting;Taping   PT Next Visit Plan passive range of motion and grade I and II joint mobilizations as required; add supine flexion; supin d2 flexio; cabinet lifts with or without weights; Add prone row;    PT Home Exercise Plan shoulder extension; scap retraction; standing ER/ IR; shoulder wnad flexion    Consulted and Agree with Plan of Care Patient      Patient will benefit from skilled therapeutic intervention in order to improve the following deficits and impairments:  Decreased activity tolerance, Pain, Decreased strength, Decreased range of motion, Hypermobility, Impaired UE functional use  Visit Diagnosis: Chronic left shoulder pain  Stiffness of left shoulder, not elsewhere classified  Muscle weakness (generalized)     Problem List There are no active problems to display for this patient.   Carney Living PT DPT  05/15/2017, 7:22 AM  [facility] [address] [place], [place], [address] Phone: [phone]   Fax:  [phone]  Name: Charlene Cowdrey MRN: [id number] Date of Birth: [birth year]

## 2017-05-21 ENCOUNTER — Ambulatory Visit: Payer: Medicare Other | Admitting: Physical Therapy

## 2017-05-21 ENCOUNTER — Encounter: Payer: Self-pay | Admitting: Physical Therapy

## 2017-05-21 DIAGNOSIS — M25512 Pain in left shoulder: Secondary | ICD-10-CM | POA: Diagnosis not present

## 2017-05-21 DIAGNOSIS — G8929 Other chronic pain: Secondary | ICD-10-CM

## 2017-05-21 DIAGNOSIS — M6281 Muscle weakness (generalized): Secondary | ICD-10-CM

## 2017-05-21 DIAGNOSIS — M25612 Stiffness of left shoulder, not elsewhere classified: Secondary | ICD-10-CM

## 2017-05-21 NOTE — Therapy (Signed)
[place], [place], [address] Phone: [phone]   Fax:  [phone]  Physical Therapy Treatment  Patient Details  Name: Logan Chambers MRN: [id number] Date of Birth: 18-Jun-1951 Referring Provider: Dr Edmonia Lynch   Encounter Date: 05/21/2017      PT End of Session - 05/21/17 1045    Visit Number 3   Number of Visits 16   Date for PT Re-Evaluation 07/03/17   PT Start Time 0847   PT Stop Time 0930   PT Time Calculation (min) 43 min   Activity Tolerance Patient tolerated treatment well   Behavior During Therapy [facility] for tasks assessed/performed      Past Medical History:  Diagnosis Date  . Arm fracture, left   . Arthritis     History reviewed. No pertinent surgical history.  There were no vitals filed for this visit.      Subjective Assessment - 05/21/17 0854    Subjective No pain right now.  or yesterday.  Able to sleep on the shoulder.  He has been using a soup can to exercise.     Currently in Pain? No/denies   Pain Location Shoulder   Pain Orientation Left   Pain Type Chronic pain   Pain Radiating Towards a little in his left neck   Pain Frequency Intermittent   Aggravating Factors  cold,  rainey weather   Pain Relieving Factors keeping warm   Effect of Pain on Daily Activities eases into activity.            [facility] PT Assessment - 05/21/17 0001      AROM   Right/Left Shoulder Left   Left Shoulder Flexion 116 Degrees   Left Shoulder ABduction 88 Degrees                     [facility] Adult PT Treatment/Exercise - 05/21/17 0001      Self-Care   Self-Care Posture   Posture practiced with lumbar roll and pillow,  informed how posture is related to shoulder     Shoulder Exercises: Supine   Protraction 10 reps  2 sewts 1.2 LBS serratus punch   External Rotation 10 reps  2 sets   Theraband Level (Shoulder External Rotation) Level 2 (Red)  cues initially   Other  Supine Exercises wand flexion x10     Also with fist on forehead, moving elbow add and abd 10 X with manual inferior glides increased abd/ scaption   also chest press 10 X with cues.  140 AA flexion   Other Supine Exercises triceps 2,3 LBS 10 x each,  PTA supporting elbow     Shoulder Exercises: Pulleys   Flexion 3 minutes   ABduction 3 minutes  scaption     Modalities   Modalities --  moist heat concurrent with pulleys,  6 minutes     Manual Therapy   Manual Therapy --  soft tissue work snterior lateral neck, clavicle,  pecs    Manual therapy comments PROM into flexion IR and ER  sustained joint capsule inferior , posterior glides. ROM imp                PT Education - 05/21/17 1045    Education provided Yes   Education Details posture   Person(s) Educated Patient   Methods Explanation;Demonstration   Comprehension Verbalized understanding;Returned demonstration          PT Short Term Goals - 05/21/17 1055  PT SHORT TERM GOAL #1   Title Patient will increase passive shoulder range of motion to full without pain    Baseline AA140 flexion,  abduction (with some scaption 110,  ER WNL.  mild end range pain   Time 4   Period Weeks   Status On-going     PT SHORT TERM GOAL #2   Time 4   Period Weeks   Status Unable to assess     PT SHORT TERM GOAL #3   Title Patient will be independent with inital HEP    Baseline cues needed   Time 4   Period Weeks   Status On-going           PT Long Term Goals - 05/08/17 1600      PT LONG TERM GOAL #1   Title Patient will reach behind his head without pain in order to perfrom ADL's    Time 8   Period Weeks   Status New     PT LONG TERM GOAL #2   Title Patient will put 2 lb weight into cabinet without pain in order to perfrom IADL's    Time 8   Period Weeks   Status New     PT LONG TERM GOAL #3   Title Patient will demsotrate 5/5 left shoulder grosss strength in order to perform ADL's    Time 8   Period  Weeks   Status New               Plan - 05/21/17 1051    Clinical Impression Statement 116 flexion, 88 abduction AROM left shoulder prior to exercise.  It improved in ROM post session  AA 140.  Patient is compliant with HEP.  Posture greatly improved with cues , support.  No new exercises added today due to ran out of time.     PT Next Visit Plan passive range of motion and grade I and II joint mobilizations as required; add supine flexion; supin d2 flexio; cabinet lifts with or without weights; Add prone row;    PT Home Exercise Plan shoulder extension; scap retraction; standing ER/ IR; shoulder wnad flexion    Consulted and Agree with Plan of Care Patient      Patient will benefit from skilled therapeutic intervention in order to improve the following deficits and impairments:     Visit Diagnosis: Chronic left shoulder pain  Stiffness of left shoulder, not elsewhere classified  Muscle weakness (generalized)     Problem List There are no active problems to display for this patient.   HARRIS,KAREN PTA 05/21/2017, 10:58 AM  [facility] [address] [place], [place], [address] Phone: [phone]   Fax:  [phone]  Name: Logan Chambers MRN: [id number] Date of Birth: 1951/02/20

## 2017-05-27 ENCOUNTER — Ambulatory Visit: Payer: Medicare Other | Admitting: Physical Therapy

## 2017-06-03 ENCOUNTER — Ambulatory Visit: Payer: Medicare Other | Attending: Internal Medicine | Admitting: Physical Therapy

## 2017-06-03 DIAGNOSIS — G8929 Other chronic pain: Secondary | ICD-10-CM | POA: Insufficient documentation

## 2017-06-03 DIAGNOSIS — M25612 Stiffness of left shoulder, not elsewhere classified: Secondary | ICD-10-CM | POA: Insufficient documentation

## 2017-06-03 DIAGNOSIS — M25512 Pain in left shoulder: Secondary | ICD-10-CM | POA: Insufficient documentation

## 2017-06-03 DIAGNOSIS — M6281 Muscle weakness (generalized): Secondary | ICD-10-CM | POA: Insufficient documentation

## 2017-06-03 NOTE — Therapy (Signed)
[place], [place], [address] Phone: [phone]   Fax:  [phone]  Physical Therapy Treatment/ Discharge   Patient Details  Name: Logan Chambers MRN: [id number] Date of Birth: 1951-01-13 Referring Provider: Dr Edmonia Lynch   Encounter Date: 06/03/2017      PT End of Session - 06/03/17 0848    Visit Number 4   Number of Visits 16   Date for PT Re-Evaluation 07/03/17   Authorization Type Medicare   PT Start Time 0800   PT Stop Time 0842   PT Time Calculation (min) 42 min   Activity Tolerance Patient tolerated treatment well   Behavior During Therapy [facility] for tasks assessed/performed      Past Medical History:  Diagnosis Date  . Arm fracture, left   . Arthritis     No past surgical history on file.  There were no vitals filed for this visit.      Subjective Assessment - 06/03/17 0846    Subjective Patient reports no pain at this time. He has been using his arm without difficulty. He is doing his exercises at home. He has been released by his doctor.    Limitations Lifting   Diagnostic tests X-ray: 05/02/2017: well healing shoulder    Patient Stated Goals Get back to using his shoulder    Currently in Pain? No/denies                         [facility] Adult PT Treatment/Exercise - 06/03/17 0001      Shoulder Exercises: Supine   Protraction Limitations 2x10 2lb      Shoulder Exercises: Sidelying   External Rotation Limitations 2x10 3lb      Shoulder Exercises: Standing   External Rotation Limitations red 2x10    Internal Rotation Limitations red 2x10    Extension Limitations green  2x10    Row Limitations 2x10 green    Other Standing Exercises UE ranger 3x10      Manual Therapy   Manual therapy comments PROM into flexion IR and ER  sustained joint capsule inferior , posterior glides. ROM imp                PT Education - 06/03/17 0847    Education provided  Yes   Education Details reviewed final HEP    Person(s) Educated Patient   Methods Explanation;Demonstration   Comprehension Verbalized understanding;Returned demonstration          PT Short Term Goals - 06/03/17 1536      PT SHORT TERM GOAL #1   Title Patient will increase passive shoulder range of motion to full without pain    Baseline full passive motion    Time 4   Period Weeks   Status Achieved     PT SHORT TERM GOAL #2   Title Patient will demsotrate 4+/5 left shoulder strength    Baseline 5/5 gross    Time 4   Period Weeks   Status Achieved     PT SHORT TERM GOAL #3   Title Patient will be independent with inital HEP    Baseline no cuing required    Time 4   Period Weeks   Status Achieved           PT Long Term Goals - 06/03/17 1536      PT LONG TERM GOAL #1   Title Patient will reach behind his head without pain in order  to perfrom ADL's    Baseline no pain    Time 8   Period Weeks   Status Achieved     PT LONG TERM GOAL #2   Title Patient will put 2 lb weight into cabinet without pain in order to perfrom IADL's    Time 8   Period Weeks   Status Achieved     PT LONG TERM GOAL #3   Title Patient will demsotrate 5/5 left shoulder grosss strength in order to perform ADL's    Time 8   Period Weeks   Status Achieved               Plan - 07/02/2017 0848    Clinical Impression Statement Patient has progressed very well with therapy. He has reached all goals. He has no need for further skilled therapy D/C to HEP.    Clinical Presentation Stable   Clinical Decision Making Low   Rehab Potential Good   PT Frequency 2x / week   PT Duration 8 weeks   PT Treatment/Interventions ADLs/Self Care Home Management;Cryotherapy;Electrical Stimulation;Iontophoresis 60m/ml Dexamethasone;Stair training;Gait training;Moist Heat;Therapeutic activities;Therapeutic exercise;Patient/family education;Passive range of motion;Dry needling;Splinting;Taping   PT Next  Visit Plan D/C tpo       Patient will benefit from skilled therapeutic intervention in order to improve the following deficits and impairments:  Decreased activity tolerance, Pain, Decreased strength, Decreased range of motion, Hypermobility, Impaired UE functional use  Visit Diagnosis: Chronic left shoulder pain  Stiffness of left shoulder, not elsewhere classified  Muscle weakness (generalized)       G-Codes - 1Nov 07, 20180853    Functional Assessment Tool Used (Outpatient Only) FOTO, clinical decision making   Functional Limitation Carrying, moving and handling objects   Carrying, Moving and Handling Objects Goal Status (([id number] At least 1 percent but less than 20 percent impaired, limited or restricted   Carrying, Moving and Handling Objects Discharge Status [phone] At least 1 percent but less than 20 percent impaired, limited or restricted     PHYSICAL THERAPY DISCHARGE SUMMARY  Visits from Start of Care: 4  Current functional level related to goals / functional outcomes: No pain with functional activity    Remaining deficits: None    Education / Equipment: HEP  Plan: Patient agrees to discharge.  Patient goals were not met. Patient is being discharged due to meeting the stated rehab goals.  ?????      Problem List There are no active problems to display for this patient.   DCarney Living12018/11/07 3:37 PM  [facility] [place] [address]Phone: [phone]  Fax:  [phone] Name: Logan BonfieldMRN: [id number] of Birth: [birth year]

## 2017-11-04 ENCOUNTER — Other Ambulatory Visit: Payer: Self-pay | Admitting: Internal Medicine

## 2017-11-04 DIAGNOSIS — Z720 Tobacco use: Secondary | ICD-10-CM

## 2017-11-19 ENCOUNTER — Ambulatory Visit: Payer: Medicare HMO

## 2017-11-19 ENCOUNTER — Ambulatory Visit
Admission: RE | Admit: 2017-11-19 | Discharge: 2017-11-19 | Disposition: A | Discharge status: Home / Self Care | Payer: Medicare Other | Source: Ambulatory Visit | Attending: Internal Medicine | Admitting: Internal Medicine

## 2017-11-19 DIAGNOSIS — Z720 Tobacco use: Secondary | ICD-10-CM

## 2018-02-09 ENCOUNTER — Other Ambulatory Visit: Payer: Self-pay

## 2018-02-09 ENCOUNTER — Emergency Department (HOSPITAL_COMMUNITY)
Admission: EM | Admit: 2018-02-09 | Discharge: 2018-02-09 | Disposition: A | Discharge status: Home / Self Care | Payer: Medicare Other | Attending: Emergency Medicine | Admitting: Emergency Medicine

## 2018-02-09 ENCOUNTER — Encounter (HOSPITAL_COMMUNITY): Payer: Self-pay

## 2018-02-09 DIAGNOSIS — I10 Essential (primary) hypertension: Secondary | ICD-10-CM | POA: Diagnosis not present

## 2018-02-09 DIAGNOSIS — R42 Dizziness and giddiness: Secondary | ICD-10-CM | POA: Insufficient documentation

## 2018-02-09 DIAGNOSIS — F17210 Nicotine dependence, cigarettes, uncomplicated: Secondary | ICD-10-CM | POA: Insufficient documentation

## 2018-02-09 DIAGNOSIS — Z79899 Other long term (current) drug therapy: Secondary | ICD-10-CM | POA: Insufficient documentation

## 2018-02-09 HISTORY — DX: Essential (primary) hypertension: I10

## 2018-02-09 LAB — CBC WITH DIFFERENTIAL/PLATELET
ABS IMMATURE GRANULOCYTES: 0 10*3/uL (ref 0.0–0.1)
BASOS ABS: 0 10*3/uL (ref 0.0–0.1)
BASOS PCT: 1 %
EOS PCT: 8 %
Eosinophils Absolute: 0.2 10*3/uL (ref 0.0–0.7)
HCT: 35.8 % — ABNORMAL LOW (ref 39.0–52.0)
HEMOGLOBIN: 12.5 g/dL — AB (ref 13.0–17.0)
Immature Granulocytes: 0 %
Lymphocytes Relative: 48 %
Lymphs Abs: 1.5 10*3/uL (ref 0.7–4.0)
MCH: 32.1 pg (ref 26.0–34.0)
MCHC: 34.9 g/dL (ref 30.0–36.0)
MCV: 92 fL (ref 78.0–100.0)
MONO ABS: 0.3 10*3/uL (ref 0.1–1.0)
MONOS PCT: 11 %
NEUTROS ABS: 1 10*3/uL — AB (ref 1.7–7.7)
Neutrophils Relative %: 32 %
Platelets: 207 10*3/uL (ref 150–400)
RBC: 3.89 MIL/uL — ABNORMAL LOW (ref 4.22–5.81)
RDW: 13.6 % (ref 11.5–15.5)
WBC: 3.2 10*3/uL — ABNORMAL LOW (ref 4.0–10.5)

## 2018-02-09 LAB — COMPREHENSIVE METABOLIC PANEL
ALK PHOS: 58 U/L (ref 38–126)
ALT: 12 U/L — AB (ref 17–63)
ANION GAP: 12 (ref 5–15)
AST: 26 U/L (ref 15–41)
Albumin: 3.9 g/dL (ref 3.5–5.0)
BUN: <5 mg/dL — ABNORMAL LOW (ref 6–20)
CALCIUM: 8.9 mg/dL (ref 8.9–10.3)
CO2: 23 mmol/L (ref 22–32)
CREATININE: 0.98 mg/dL (ref 0.61–1.24)
Chloride: 92 mmol/L — ABNORMAL LOW (ref 101–111)
Glucose, Bld: 110 mg/dL — ABNORMAL HIGH (ref 65–99)
Potassium: 3.8 mmol/L (ref 3.5–5.1)
Sodium: 127 mmol/L — ABNORMAL LOW (ref 135–145)
Total Bilirubin: 0.4 mg/dL (ref 0.3–1.2)
Total Protein: 7.2 g/dL (ref 6.5–8.1)

## 2018-02-09 LAB — TROPONIN I

## 2018-02-09 MED ORDER — SODIUM CHLORIDE 0.9 % IV BOLUS
1000.0000 mL | Freq: Once | INTRAVENOUS | Status: AC
  Administered 2018-02-09: 1000 mL via INTRAVENOUS

## 2018-02-09 NOTE — ED Provider Notes (Signed)
Patient placed in Quick Look pathway, seen and evaluated   Chief Complaint: dizziness  HPI:   Pt began feeling dizzy like he is going to pass out   ROS: weakness  Physical Exam:   Gen: No distress  Neuro: Awake and Alert  Skin: Warm    Focused Exam: Lungs clear, Heart rrr   Initiation of care has begun. The patient has been counseled on the process, plan, and necessity for staying for the completion/evaluation, and the remainder of the medical screening examination   Sidney Ace 02/09/18 1703    Valarie Merino, MD 02/09/18 2351

## 2018-02-09 NOTE — ED Notes (Signed)
Pt given Ginger Ale, per Tawanna Solo - RN.

## 2018-02-09 NOTE — ED Triage Notes (Signed)
Pt states "I drank one 40 oz watched TV and slept, that is all I have done today".

## 2018-02-09 NOTE — Discharge Instructions (Addendum)
Please follow up with your doctor this week for repeat lab check. You were found to have low sodium and low hemoglobin.

## 2018-02-09 NOTE — ED Provider Notes (Signed)
[facility] Provider Note   CSN: [id number] Arrival date & time: 02/09/18  1624   History   Chief Complaint Chief Complaint  Patient presents with  . Dizziness    HPI Logan Chambers is a 67 y.o. male.  Sudden onset lightheadedness when standing up [place] bus[place]. Was sitting in the sun prior to this. Symptoms now resolved.  The history is provided by the patient.  Dizziness  Quality:  Lightheadedness Severity:  Mild Onset quality:  Sudden Progression:  Resolved Chronicity:  New Context comment:  Standing up Relieved by: self resolved. Associated symptoms: no chest pain, no headaches, no nausea, no palpitations, no shortness of breath, no syncope, no vision changes, no vomiting and no weakness   Associated symptoms comment:  Diaphoresis Risk factors: no hx of stroke, no multiple medications and no new medications     Past Medical History:  Diagnosis Date  . Arm fracture, left   . Arthritis   . Hypertension     There are no active problems to display for this patient.   History reviewed. No pertinent surgical history.     Home Medications    Prior to Admission medications   Medication Sig Start Date End Date Taking? Authorizing Provider  lisinopril (PRINIVIL,ZESTRIL) 20 MG tablet Take 20 mg by mouth daily. 01/19/18  Yes [provider]  methocarbamol (ROBAXIN) 500 MG tablet Take 2 tablets (1,000 mg total) by mouth at bedtime as needed for muscle spasms. Patient not taking: Reported on 02/09/2018 03/24/17   Fatima Blank, MD    Family History No family history on file.  Social History Social History   Tobacco Use  . Smoking status: Current Every Day Smoker    Types: Cigarettes  . Smokeless tobacco: Never Used  Substance Use Topics  . Alcohol use: Yes    Comment: 2 40 oz per day   . Drug use: No     Allergies   Patient has no known allergies.   Review of Systems Review of Systems    Constitutional: Positive for diaphoresis. Negative for chills and fever.  HENT: Negative for ear pain and sore throat.   Eyes: Negative for pain and visual disturbance.  Respiratory: Negative for cough and shortness of breath.   Cardiovascular: Negative for chest pain, palpitations and syncope.  Gastrointestinal: Negative for abdominal pain, nausea and vomiting.  Genitourinary: Negative for dysuria and hematuria.  Musculoskeletal: Negative for arthralgias and back pain.  Skin: Negative for color change and rash.  Neurological: Positive for dizziness and light-headedness. Negative for seizures, syncope, weakness and headaches.  All other systems reviewed and are negative.    Physical Exam Updated Vital Signs BP 133/75 (BP Location: Right Arm)   Pulse 64   Temp 98 F (36.7 C) (Oral)   Resp 16   Ht 6\' 2"  (1.88 m)   Wt 77.1 kg (170 lb)   SpO2 100%   BMI 21.83 kg/m   Physical Exam  Constitutional: He is oriented to person, place, and time. He appears well-developed.  HENT:  Head: Normocephalic and atraumatic.  Mouth/Throat: Oropharynx is clear and moist.  Eyes: Conjunctivae and EOM are normal.  Neck: Normal range of motion. Neck supple.  Cardiovascular: Normal rate, regular rhythm and intact distal pulses.  No murmur heard. Pulmonary/Chest: Effort normal and breath sounds normal. No stridor. No respiratory distress.  Abdominal: Soft. He exhibits no distension. There is no tenderness. There is no guarding.  Musculoskeletal: Normal range of motion. He  exhibits no edema.  Neurological: He is alert and oriented to person, place, and time. No cranial nerve deficit or sensory deficit.  Skin: Skin is warm and dry.  Psychiatric: He has a normal mood and affect.  Nursing note and vitals reviewed.    ED Treatments / Results  Labs (all labs ordered are listed, but only abnormal results are displayed) Labs Reviewed  CBC WITH DIFFERENTIAL/PLATELET - Abnormal; Notable for the  following components:      Result Value   WBC 3.2 (*)    RBC 3.89 (*)    Hemoglobin 12.5 (*)    HCT 35.8 (*)    Neutro Abs 1.0 (*)    All other components within normal limits  COMPREHENSIVE METABOLIC PANEL - Abnormal; Notable for the following components:   Sodium 127 (*)    Chloride 92 (*)    Glucose, Bld 110 (*)    BUN <5 (*)    ALT 12 (*)    All other components within normal limits  TROPONIN I    EKG EKG Interpretation  Date/Time:  Monday February 09 2018 16:56:24 EDT Ventricular Rate:  72 PR Interval:  202 QRS Duration: 94 QT Interval:  390 QTC Calculation: 427 R Axis:   64 Text Interpretation:  Normal sinus rhythm Minimal voltage criteria for LVH, may be normal variant No previous tracing Confirmed by Lajean Saver [phone]) on 02/09/2018 5:54:56 PM   Radiology No results found.  Procedures Procedures (including critical care time)  Medications Ordered in ED Medications  sodium chloride 0.9 % bolus 1,000 mL (0 mLs Intravenous Stopped 02/09/18 1750)     Initial Impression / Assessment and Plan / ED Course  I have reviewed the triage vital signs and the nursing notes.  Pertinent labs & imaging results that were available during my care of the patient were reviewed by me and considered in my medical decision making (see chart for details).     Logan Chambers is a 67 y.o. male with PMHx of HTN who presents after sudden onset lightheadedness after standing, now resolved. Reviewed and confirmed nursing documentation for past medical history, family history, social history. VS afebrile, initial BP in triage 88/52, though on repeat check with no intervention, found to be 103/63. Normal cardiopulmonary and neurologic exam. Ddx includes orthostatic hypotension, dehydration. Denies cardiopulmonary complaints. Denies infectious complaints.   EKG with no acute abnormalities. CBC with leukopenia with WBC 3.2, normocytic anemia with hgb 12.5, CMP with hyponatremia 127,  hypochloridemia 92, otherwise wnl. Trop neg. NS bolus given.   Patient monitored in department with no recurrence of symptoms, remained normotensive, tolerating PO intake and ambulation with no difficulties. No prior labs for comparison, but favor chronic hyponatremia.   Old records reviewed. Labs reviewed by me and used in the medical decision making. EKG reviewed by me and used in the medical decision making. D/c home in stable condition, return precautions discussed. Patient agreeable with plan for d/c home. He will f/u with PCP regarding lab abnormalities, will get labs rechecked this week.   Final Clinical Impressions(s) / ED Diagnoses   Final diagnoses:  Lightheadedness    ED Discharge Orders    None       Norm Salt, MD 02/09/18 [facility]    Lajean Saver, MD 02/12/18 [phone]

## 2018-02-09 NOTE — ED Triage Notes (Signed)
Pt states that he was sitting waiting on the bus and broke out into a cold sweat. Denies CP, denies SOB.

## 2018-10-09 ENCOUNTER — Ambulatory Visit (INDEPENDENT_AMBULATORY_CARE_PROVIDER_SITE_OTHER): Payer: Medicare Other | Admitting: Podiatry

## 2018-10-09 ENCOUNTER — Ambulatory Visit (INDEPENDENT_AMBULATORY_CARE_PROVIDER_SITE_OTHER): Payer: Medicare Other

## 2018-10-09 DIAGNOSIS — M722 Plantar fascial fibromatosis: Secondary | ICD-10-CM

## 2018-10-09 MED ORDER — MELOXICAM 15 MG PO TABS: 15.0000 mg | ORAL_TABLET | Freq: Every day | ORAL | 0 refills | Status: DC

## 2018-10-09 NOTE — Patient Instructions (Signed)
Plantar Fasciitis (Heel Spur Syndrome) with Rehab The plantar fascia is a fibrous, ligament-like, soft-tissue structure that spans the bottom of the foot. Plantar fasciitis is a condition that causes pain in the foot due to inflammation of the tissue. SYMPTOMS   Pain and tenderness on the underneath side of the foot.  Pain that worsens with standing or walking. CAUSES  Plantar fasciitis is caused by irritation and injury to the plantar fascia on the underneath side of the foot. Common mechanisms of injury include:  Direct trauma to bottom of the foot.  Damage to a small nerve that runs under the foot where the main fascia attaches to the heel bone.  Stress placed on the plantar fascia due to bone spurs. RISK INCREASES WITH:   Activities that place stress on the plantar fascia (running, jumping, pivoting, or cutting).  Poor strength and flexibility.  Improperly fitted shoes.  Tight calf muscles.  Flat feet.  Failure to warm-up properly before activity.  Obesity. PREVENTION  Warm up and stretch properly before activity.  Allow for adequate recovery between workouts.  Maintain physical fitness:  Strength, flexibility, and endurance.  Cardiovascular fitness.  Maintain a health body weight.  Avoid stress on the plantar fascia.  Wear properly fitted shoes, including arch supports for individuals who have flat feet.  PROGNOSIS  If treated properly, then the symptoms of plantar fasciitis usually resolve without surgery. However, occasionally surgery is necessary.  RELATED COMPLICATIONS   Recurrent symptoms that may result in a chronic condition.  Problems of the lower back that are caused by compensating for the injury, such as limping.  Pain or weakness of the foot during push-off following surgery.  Chronic inflammation, scarring, and partial or complete fascia tear, occurring more often from repeated injections.  TREATMENT  Treatment initially involves the  use of ice and medication to help reduce pain and inflammation. The use of strengthening and stretching exercises may help reduce pain with activity, especially stretches of the Achilles tendon. These exercises may be performed at home or with a therapist. Your caregiver may recommend that you use heel cups of arch supports to help reduce stress on the plantar fascia. Occasionally, corticosteroid injections are given to reduce inflammation. If symptoms persist for greater than 6 months despite non-surgical (conservative), then surgery may be recommended.   MEDICATION   If pain medication is necessary, then nonsteroidal anti-inflammatory medications, such as aspirin and ibuprofen, or other minor pain relievers, such as acetaminophen, are often recommended.  Do not take pain medication within 7 days before surgery.  Prescription pain relievers may be given if deemed necessary by your caregiver. Use only as directed and only as much as you need.  Corticosteroid injections may be given by your caregiver. These injections should be reserved for the most serious cases, because they may only be given a certain number of times.  HEAT AND COLD  Cold treatment (icing) relieves pain and reduces inflammation. Cold treatment should be applied for 10 to 15 minutes every 2 to 3 hours for inflammation and pain and immediately after any activity that aggravates your symptoms. Use ice packs or massage the area with a piece of ice (ice massage).  Heat treatment may be used prior to performing the stretching and strengthening activities prescribed by your caregiver, physical therapist, or athletic trainer. Use a heat pack or soak the injury in warm water.  SEEK IMMEDIATE MEDICAL CARE IF:  Treatment seems to offer no benefit, or the condition worsens.  Any medications   produce adverse side effects.  EXERCISES- RANGE OF MOTION (ROM) AND STRETCHING EXERCISES - Plantar Fasciitis (Heel Spur Syndrome) These exercises  may help you when beginning to rehabilitate your injury. Your symptoms may resolve with or without further involvement from your physician, physical therapist or athletic trainer. While completing these exercises, remember:   Restoring tissue flexibility helps normal motion to return to the joints. This allows healthier, less painful movement and activity.  An effective stretch should be held for at least 30 seconds.  A stretch should never be painful. You should only feel a gentle lengthening or release in the stretched tissue.  RANGE OF MOTION - Toe Extension, Flexion  Sit with your right / left leg crossed over your opposite knee.  Grasp your toes and gently pull them back toward the top of your foot. You should feel a stretch on the bottom of your toes and/or foot.  Hold this stretch for 10 seconds.  Now, gently pull your toes toward the bottom of your foot. You should feel a stretch on the top of your toes and or foot.  Hold this stretch for 10 seconds. Repeat  times. Complete this stretch 3 times per day.   RANGE OF MOTION - Ankle Dorsiflexion, Active Assisted  Remove shoes and sit on a chair that is preferably not on a carpeted surface.  Place right / left foot under knee. Extend your opposite leg for support.  Keeping your heel down, slide your right / left foot back toward the chair until you feel a stretch at your ankle or calf. If you do not feel a stretch, slide your bottom forward to the edge of the chair, while still keeping your heel down.  Hold this stretch for 10 seconds. Repeat 3 times. Complete this stretch 2 times per day.   STRETCH  Gastroc, Standing  Place hands on wall.  Extend right / left leg, keeping the front knee somewhat bent.  Slightly point your toes inward on your back foot.  Keeping your right / left heel on the floor and your knee straight, shift your weight toward the wall, not allowing your back to arch.  You should feel a gentle stretch  in the right / left calf. Hold this position for 10 seconds. Repeat 3 times. Complete this stretch 2 times per day.  STRETCH  Soleus, Standing  Place hands on wall.  Extend right / left leg, keeping the other knee somewhat bent.  Slightly point your toes inward on your back foot.  Keep your right / left heel on the floor, bend your back knee, and slightly shift your weight over the back leg so that you feel a gentle stretch deep in your back calf.  Hold this position for 10 seconds. Repeat 3 times. Complete this stretch 2 times per day.  STRETCH  Gastrocsoleus, Standing  Note: This exercise can place a lot of stress on your foot and ankle. Please complete this exercise only if specifically instructed by your caregiver.   Place the ball of your right / left foot on a step, keeping your other foot firmly on the same step.  Hold on to the wall or a rail for balance.  Slowly lift your other foot, allowing your body weight to press your heel down over the edge of the step.  You should feel a stretch in your right / left calf.  Hold this position for 10 seconds.  Repeat this exercise with a slight bend in your right /   left knee. Repeat 3 times. Complete this stretch 2 times per day.   STRENGTHENING EXERCISES - Plantar Fasciitis (Heel Spur Syndrome)  These exercises may help you when beginning to rehabilitate your injury. They may resolve your symptoms with or without further involvement from your physician, physical therapist or athletic trainer. While completing these exercises, remember:   Muscles can gain both the endurance and the strength needed for everyday activities through controlled exercises.  Complete these exercises as instructed by your physician, physical therapist or athletic trainer. Progress the resistance and repetitions only as guided.  STRENGTH - Towel Curls  Sit in a chair positioned on a non-carpeted surface.  Place your foot on a towel, keeping your heel  on the floor.  Pull the towel toward your heel by only curling your toes. Keep your heel on the floor. Repeat 3 times. Complete this exercise 2 times per day.  STRENGTH - Ankle Inversion  Secure one end of a rubber exercise band/tubing to a fixed object (table, pole). Loop the other end around your foot just before your toes.  Place your fists between your knees. This will focus your strengthening at your ankle.  Slowly, pull your big toe up and in, making sure the band/tubing is positioned to resist the entire motion.  Hold this position for 10 seconds.  Have your muscles resist the band/tubing as it slowly pulls your foot back to the starting position. Repeat 3 times. Complete this exercises 2 times per day.  Document Released: 08/12/2005 Document Revised: 11/04/2011 Document Reviewed: 11/24/2008 [facility] Patient Information 2014 [place], [place].   ICE INSTRUCTIONS  Apply ice or cold pack to the affected area at least 3 times a day for 10-15 minutes each time.  You should also use ice after prolonged activity or vigorous exercise.  Do not apply ice longer than 20 minutes at one time.  Always keep a cloth between your skin and the ice pack to prevent burns.  Being consistent and following these instructions will help control your symptoms.  We suggest you purchase a gel ice pack because they are reusable and do bit leak.  Some of them are designed to wrap around the area.  Use the method that works best for you.  Here are some other suggestions for icing.   Use a frozen bag of peas or corn-inexpensive and molds well to your body, usually stays frozen for 10 to 20 minutes.  Wet a towel with cold water and squeeze out the excess until it's damp.  Place in a bag in the freezer for 20 minutes. Then remove and use.

## 2018-10-14 NOTE — Progress Notes (Signed)
Subjective:   Patient ID: Logan Chambers, male   DOB: 68 y.o.   MRN: [id number]   HPI 68 year old male presents the office today for concerns of left heel pain worse than the right.  He states he gets pain to the palms of his heel which is been ongoing for last 1 month is been intermittent in duration.  He denies any recent injury or trauma to his feet denies any swelling or redness. He has not had any recent treatment. No other concerns.    Review of Systems  All other systems reviewed and are negative.  Past Medical History:  Diagnosis Date  . Arm fracture, left   . Arthritis   . Hypertension     No past surgical history on file.   Current Outpatient Medications:  .  amLODipine (NORVASC) 5 MG tablet, TAKE ONE TABLET BY MOUTH DIALY, Disp: , Rfl:  .  ipratropium (ATROVENT) 0.06 % nasal spray, 2 SPRAYS INTO EACH NOSTRIL TWICE DAILY AS NEEDED FOR DRAINAGE, Disp: , Rfl:  .  lisinopril (PRINIVIL,ZESTRIL) 20 MG tablet, Take 20 mg by mouth daily., Disp: , Rfl: 3 .  meloxicam (MOBIC) 15 MG tablet, Take 1 tablet (15 mg total) by mouth daily., Disp: 14 tablet, Rfl: 0 .  methocarbamol (ROBAXIN) 500 MG tablet, Take 2 tablets (1,000 mg total) by mouth at bedtime as needed for muscle spasms. (Patient not taking: Reported on 02/09/2018), Disp: 20 tablet, Rfl: 0  No Known Allergies       Objective:  Physical Exam  General: AAO x3, NAD  Dermatological: Skin is warm, dry and supple bilateral. Nails x 10 are well manicured; remaining integument appears unremarkable at this time. There are no open sores, no preulcerative lesions, no rash or signs of infection present.  Vascular: Dorsalis Pedis artery and Posterior Tibial artery pedal pulses are 2/4 bilateral with immedate capillary fill time. There is no pain with calf compression, swelling, warmth, erythema.   Neruologic: Grossly intact via light touch bilateral.  Protective threshold with Semmes Wienstein monofilament intact to all pedal sites  bilateral.  Negative Tinel sign.  Musculoskeletal: Tenderness to palpation along the plantar medial tubercle of the calcaneus at the insertion of plantar fascia on the left >> right foot. There is no pain along the course of the plantar fascia within the arch of the foot. Plantar fascia appears to be intact. There is no pain with lateral compression of the calcaneus or pain with vibratory sensation. There is no pain along the course or insertion of the achilles tendon. No other areas of tenderness to bilateral lower extremities.Muscular strength 5/5 in all groups tested bilateral.  Gait: Unassisted, Nonantalgic.       Assessment:   Bilateral heel pain, plantar fasciitis left side worse than right    Plan:  -Treatment options discussed including all alternatives, risks, and complications -Etiology of symptoms were discussed -X-rays were obtained and reviewed with the patient.  Events of acute fracture or stress fracture. -Prescribed mobic. Discussed side effects of the medication and directed to stop if any are to occur and call the office.  -Plantar fascial braces dispensed x2. -Discussed resting, icing daily.  Discussed shoe modifications and orthotics.  Return in about 4 weeks (around 11/06/2018).  Trula Slade DPM

## 2018-11-03 ENCOUNTER — Emergency Department (HOSPITAL_COMMUNITY)
Admission: EM | Admit: 2018-11-03 | Discharge: 2018-11-03 | Disposition: A | Discharge status: Home / Self Care | Payer: Medicare Other | Attending: Emergency Medicine | Admitting: Emergency Medicine

## 2018-11-03 ENCOUNTER — Encounter (HOSPITAL_COMMUNITY): Payer: Self-pay | Admitting: Emergency Medicine

## 2018-11-03 ENCOUNTER — Other Ambulatory Visit: Payer: Self-pay

## 2018-11-03 DIAGNOSIS — I10 Essential (primary) hypertension: Secondary | ICD-10-CM | POA: Insufficient documentation

## 2018-11-03 DIAGNOSIS — Z79899 Other long term (current) drug therapy: Secondary | ICD-10-CM | POA: Insufficient documentation

## 2018-11-03 DIAGNOSIS — K852 Alcohol induced acute pancreatitis without necrosis or infection: Secondary | ICD-10-CM | POA: Diagnosis not present

## 2018-11-03 DIAGNOSIS — R1013 Epigastric pain: Secondary | ICD-10-CM | POA: Diagnosis present

## 2018-11-03 DIAGNOSIS — F17210 Nicotine dependence, cigarettes, uncomplicated: Secondary | ICD-10-CM | POA: Insufficient documentation

## 2018-11-03 LAB — CBC
HCT: 40.9 % (ref 39.0–52.0)
Hemoglobin: 14.2 g/dL (ref 13.0–17.0)
MCH: 32.2 pg (ref 26.0–34.0)
MCHC: 34.7 g/dL (ref 30.0–36.0)
MCV: 92.7 fL (ref 80.0–100.0)
Platelets: 209 10*3/uL (ref 150–400)
RBC: 4.41 MIL/uL (ref 4.22–5.81)
RDW: 12.5 % (ref 11.5–15.5)
WBC: 7.1 10*3/uL (ref 4.0–10.5)
nRBC: 0 % (ref 0.0–0.2)

## 2018-11-03 LAB — COMPREHENSIVE METABOLIC PANEL
ALT: 13 U/L (ref 0–44)
AST: 32 U/L (ref 15–41)
Albumin: 4.1 g/dL (ref 3.5–5.0)
Alkaline Phosphatase: 72 U/L (ref 38–126)
Anion gap: 13 (ref 5–15)
BUN: 6 mg/dL — ABNORMAL LOW (ref 8–23)
CO2: 21 mmol/L — ABNORMAL LOW (ref 22–32)
Calcium: 9.7 mg/dL (ref 8.9–10.3)
Chloride: 94 mmol/L — ABNORMAL LOW (ref 98–111)
Creatinine, Ser: 0.81 mg/dL (ref 0.61–1.24)
GFR calc Af Amer: >60 mL/min (ref >60)
GFR calc non Af Amer: >60 mL/min (ref >60)
Glucose, Bld: 113 mg/dL — ABNORMAL HIGH (ref 70–99)
Potassium: 4.2 mmol/L (ref 3.5–5.1)
Sodium: 128 mmol/L — ABNORMAL LOW (ref 135–145)
Total Bilirubin: 1.7 mg/dL — ABNORMAL HIGH (ref 0.3–1.2)
Total Protein: 8.1 g/dL (ref 6.5–8.1)

## 2018-11-03 LAB — LIPASE, BLOOD: Lipase: 719 U/L — ABNORMAL HIGH (ref 11–51)

## 2018-11-03 MED ORDER — LIDOCAINE VISCOUS HCL 2 % MT SOLN
15.0000 mL | Freq: Once | OROMUCOSAL | Status: AC
  Administered 2018-11-03: 15 mL via ORAL
  Filled 2018-11-03: qty 15

## 2018-11-03 MED ORDER — ONDANSETRON HCL 4 MG PO TABS: 4.0000 mg | ORAL_TABLET | Freq: Four times a day (QID) | ORAL | 0 refills | Status: DC

## 2018-11-03 MED ORDER — OXYCODONE-ACETAMINOPHEN 5-325 MG PO TABS
1.0000 | ORAL_TABLET | Freq: Once | ORAL | Status: AC
  Administered 2018-11-03: 1 via ORAL
  Filled 2018-11-03: qty 1

## 2018-11-03 MED ORDER — ALUM & MAG HYDROXIDE-SIMETH 200-200-20 MG/5ML PO SUSP
30.0000 mL | Freq: Once | ORAL | Status: AC
  Administered 2018-11-03: 30 mL via ORAL
  Filled 2018-11-03: qty 30

## 2018-11-03 MED ORDER — SODIUM CHLORIDE 0.9% FLUSH: 3.0000 mL | Freq: Once | INTRAVENOUS | Status: DC

## 2018-11-03 MED ORDER — OXYCODONE HCL 5 MG PO TABS: 5.0000 mg | ORAL_TABLET | ORAL | 0 refills | Status: DC | PRN

## 2018-11-03 MED ORDER — FAMOTIDINE 20 MG PO TABS
20.0000 mg | ORAL_TABLET | Freq: Once | ORAL | Status: AC
  Administered 2018-11-03: 20 mg via ORAL
  Filled 2018-11-03: qty 1

## 2018-11-03 NOTE — ED Notes (Signed)
Pt verbalized understanding of discharge instructions and denies any further questions at this time.   

## 2018-11-03 NOTE — Discharge Instructions (Signed)
Stop drinking. It's making your pancreas cry.

## 2018-11-03 NOTE — ED Triage Notes (Signed)
Pt reports "my stomach is burning". He denies chest pain or shortness or breath, no diarrhea or hx of GERD.

## 2018-11-03 NOTE — ED Notes (Signed)
Pt aware he needs to provide urine. Urinal given.

## 2018-11-06 ENCOUNTER — Ambulatory Visit (INDEPENDENT_AMBULATORY_CARE_PROVIDER_SITE_OTHER): Payer: Medicare Other | Admitting: Podiatry

## 2018-11-06 ENCOUNTER — Other Ambulatory Visit: Payer: Self-pay

## 2018-11-06 DIAGNOSIS — M722 Plantar fascial fibromatosis: Secondary | ICD-10-CM

## 2018-11-06 NOTE — Patient Instructions (Signed)

## 2018-11-13 NOTE — ED Provider Notes (Signed)
[facility] EMERGENCY DEPARTMENT Provider Note   CSN: [id number] Arrival date & time: 11/03/18  1143    History   Chief Complaint Chief Complaint  Patient presents with  . Abdominal Pain    HPI Logan Chambers is a 68 y.o. male.  HPI   68 year old male with abdominal pain.  Describes the pain is burning in his epigastric region.  Intermittent. Worse after eating. Some vomiting.  Has no respiratory complaints.  No fevers or chills.  No diarrhea.  Has not tried taking anything for symptoms. Regular alcohol use.   Past Medical History:  Diagnosis Date  . Arm fracture, left   . Arthritis   . Hypertension     Patient Active Problem List   Diagnosis Date Noted  . Plantar fasciitis 10/09/2018    History reviewed. No pertinent surgical history.      Home Medications    Prior to Admission medications   Medication Sig Start Date End Date Taking? Authorizing Provider  amLODipine (NORVASC) 5 MG tablet Take 5 mg by mouth daily.  05/18/18  Yes [provider]  ipratropium (ATROVENT) 0.06 % nasal spray Place 2 sprays into both nostrils 2 (two) times daily.  06/23/18  Yes [provider]  lisinopril (PRINIVIL,ZESTRIL) 20 MG tablet Take 20 mg by mouth daily. 01/19/18  Yes [provider]  meloxicam (MOBIC) 15 MG tablet Take 1 tablet (15 mg total) by mouth daily. 10/09/18   Trula Slade, DPM  methocarbamol (ROBAXIN) 500 MG tablet Take 2 tablets (1,000 mg total) by mouth at bedtime as needed for muscle spasms. 03/24/17   Fatima Blank, MD  ondansetron (ZOFRAN) 4 MG tablet Take 1 tablet (4 mg total) by mouth every 6 (six) hours. 11/03/18   Virgel Manifold, MD  oxyCODONE (ROXICODONE) 5 MG immediate release tablet Take 1 tablet (5 mg total) by mouth every 4 (four) hours as needed for severe pain. 11/03/18   Virgel Manifold, MD    Family History No family history on file.  Social History Social History   Tobacco Use  . Smoking  status: Current Every Day Smoker    Types: Cigarettes  . Smokeless tobacco: Never Used  Substance Use Topics  . Alcohol use: Yes    Comment: 2 40 oz per day   . Drug use: No     Allergies   Patient has no known allergies.   Review of Systems Review of Systems  All systems reviewed and negative, other than as noted in HPI.  Physical Exam Updated Vital Signs BP (!) 176/89   Pulse 88   Temp (!) 97.5 F (36.4 C) (Oral)   Resp 17   SpO2 100%   Physical Exam Vitals signs and nursing note reviewed.  Constitutional:      General: He is not in acute distress.    Appearance: He is well-developed.  HENT:     Head: Normocephalic and atraumatic.  Eyes:     General:        Right eye: No discharge.        Left eye: No discharge.     Conjunctiva/sclera: Conjunctivae normal.  Neck:     Musculoskeletal: Neck supple.  Cardiovascular:     Rate and Rhythm: Normal rate and regular rhythm.     Heart sounds: Normal heart sounds. No murmur. No friction rub. No gallop.   Pulmonary:     Effort: Pulmonary effort is normal. No respiratory distress.     Breath sounds: Normal  breath sounds.  Abdominal:     General: There is no distension.     Palpations: Abdomen is soft.     Tenderness: There is abdominal tenderness in the epigastric area. There is no guarding or rebound.  Musculoskeletal:        General: No tenderness.  Skin:    General: Skin is warm and dry.  Neurological:     Mental Status: He is alert.  Psychiatric:        Behavior: Behavior normal.        Thought Content: Thought content normal.      ED Treatments / Results  Labs (all labs ordered are listed, but only abnormal results are displayed) Labs Reviewed  LIPASE, BLOOD - Abnormal; Notable for the following components:      Result Value   Lipase 719 (*)    All other components within normal limits  COMPREHENSIVE METABOLIC PANEL - Abnormal; Notable for the following components:   Sodium 128 (*)    Chloride 94  (*)    CO2 21 (*)    Glucose, Bld 113 (*)    BUN 6 (*)    Total Bilirubin 1.7 (*)    All other components within normal limits  CBC    EKG None  Radiology No results found.  Procedures Procedures (including critical care time)  Medications Ordered in ED Medications  alum & mag hydroxide-simeth (MAALOX/MYLANTA) 200-200-20 MG/5ML suspension 30 mL (30 mLs Oral Given 11/03/18 1337)    And  lidocaine (XYLOCAINE) 2 % viscous mouth solution 15 mL (15 mLs Oral Given 11/03/18 1338)  famotidine (PEPCID) tablet 20 mg (20 mg Oral Given 11/03/18 1336)  oxyCODONE-acetaminophen (PERCOCET/ROXICET) 5-325 MG per tablet 1 tablet (1 tablet Oral Given 11/03/18 1336)     Initial Impression / Assessment and Plan / ED Course  I have reviewed the triage vital signs and the nursing notes.  Pertinent labs & imaging results that were available during my care of the patient were reviewed by me and considered in my medical decision making (see chart for details).    68yM with etoh induced pancreatitis. Pain/nausea controlled. Diet discussed and also given written instructions. Abstain from etoh. Prn pain and nausea medication. Return precautions discussed.   Final Clinical Impressions(s) / ED Diagnoses   Final diagnoses:  Alcohol-induced acute pancreatitis, unspecified complication status    ED Discharge Orders         Ordered    oxyCODONE (ROXICODONE) 5 MG immediate release tablet  Every 4 hours PRN     11/03/18 1503    ondansetron (ZOFRAN) 4 MG tablet  Every 6 hours     11/03/18 1503           Virgel Manifold, MD 11/13/18 1718

## 2018-11-15 NOTE — Progress Notes (Signed)
Subjective: 68 year old male presents the office for follow-up evaluation left heel pain, plantar fasciitis.  He states he is doing "much better".  Still has some occasional discomfort but overall is improving.  He states the brace is been very helpful as well as the medication.  Has been doing stretching exercises intermittently.  No recent injury or falls or any changes otherwise. Denies any systemic complaints such as fevers, chills, nausea, vomiting. No acute changes since last appointment, and no other complaints at this time.   Objective: AAO x3, NAD DP/PT pulses palpable bilaterally, CRT less than 3 seconds This time there is no tenderness palpation the course or insertion of plantar fashion.  The plantar fascia appears to be intact.  Achilles tendon intact.  There is no other areas of tenderness like identified today. No open lesions or pre-ulcerative lesions.  No pain with calf compression, swelling, warmth, erythema  Assessment: Resolving left heel pain, plantar fasciitis  Plan: -All treatment options discussed with the patient including all alternatives, risks, complications.  -Discussed measures to help continue to help with improvement as well as reoccurrence.  Continue stretching, icing.  Anti-inflammatories as needed.  Supportive shoes and also barefoot.  Discussed orthotics. -Patient encouraged to call the office with any questions, concerns, change in symptoms.   Trula Slade DPM

## 2019-02-09 ENCOUNTER — Encounter: Payer: Self-pay | Admitting: Podiatry

## 2019-02-09 ENCOUNTER — Ambulatory Visit (INDEPENDENT_AMBULATORY_CARE_PROVIDER_SITE_OTHER): Payer: Medicare Other | Admitting: Podiatry

## 2019-02-09 ENCOUNTER — Other Ambulatory Visit: Payer: Self-pay

## 2019-02-09 VITALS — Temp 96.9°F

## 2019-02-09 DIAGNOSIS — M79671 Pain in right foot: Secondary | ICD-10-CM | POA: Diagnosis not present

## 2019-02-09 DIAGNOSIS — M722 Plantar fascial fibromatosis: Secondary | ICD-10-CM | POA: Diagnosis not present

## 2019-02-09 MED ORDER — MELOXICAM 15 MG PO TABS: 15.0000 mg | ORAL_TABLET | Freq: Every day | ORAL | 0 refills | Status: DC

## 2019-02-09 NOTE — Patient Instructions (Signed)

## 2019-02-15 NOTE — Progress Notes (Signed)
Subjective: 68 year old male presents the office today for concerns of recurrent left heel pain.  He states he still gets pain in the morning or [employer] Day for some time and stands back up and gets better with activity.  He denies any recent injury or trauma.  No numbness or tingling.  The pain does not wake him up at night. Denies any systemic complaints such as fevers, chills, nausea, vomiting. No acute changes since last appointment, and no other complaints at this time.   Objective: AAO x3, NAD DP/PT pulses palpable bilaterally, CRT less than 3 seconds Tenderness palpation on plantar medial tubercle of the calcaneus at the insertion of plantar fascia.  Plantar fascial appears to be intact.  No pain with lateral compression of calcaneus.  No pain in the course or insertion of the Achilles tendon.  No open lesions or pre-ulcerative lesions.  No pain with calf compression, swelling, warmth, erythema  Assessment: Left heel pain, plantar fasciitis reoccurrence  Plan: -All treatment options discussed with the patient including all alternatives, risks, complications.  -He declined steroid injection.  I dispensed heel cups as well as a night splint. Prescribed mobic. Discussed side effects of the medication and directed to stop if any are to occur and call the office.  -Patient encouraged to call the office with any questions, concerns, change in symptoms.   Trula Slade DPM

## 2019-03-09 ENCOUNTER — Ambulatory Visit (INDEPENDENT_AMBULATORY_CARE_PROVIDER_SITE_OTHER): Payer: Medicare Other | Admitting: Podiatry

## 2019-03-09 ENCOUNTER — Other Ambulatory Visit: Payer: Self-pay

## 2019-03-09 ENCOUNTER — Encounter: Payer: Self-pay | Admitting: Podiatry

## 2019-03-09 VITALS — Temp 97.8°F

## 2019-03-09 DIAGNOSIS — M722 Plantar fascial fibromatosis: Secondary | ICD-10-CM | POA: Diagnosis not present

## 2019-03-09 DIAGNOSIS — B351 Tinea unguium: Secondary | ICD-10-CM | POA: Diagnosis not present

## 2019-03-09 MED ORDER — MELOXICAM 15 MG PO TABS: 15.0000 mg | ORAL_TABLET | Freq: Every day | ORAL | 0 refills | Status: AC

## 2019-03-09 MED ORDER — CICLOPIROX 8 % EX SOLN: Freq: Every day | CUTANEOUS | 2 refills | Status: DC

## 2019-03-09 NOTE — Patient Instructions (Signed)

## 2019-03-17 NOTE — Progress Notes (Signed)
Subjective: 68 year old male presents the office today for follow-up evaluation of left heel pain and plantar fasciitis.  Overall he states he is doing much better.  He states that the heel cup as well as in his foot is been helpful as well as capsulitis right previously.  His pain is more intermittent.  The pain does not wake him up at night.  His main concern today is his toenails.  He states the nails become thickened and started to become a darkened discoloration.  He has no pain in the nails and denies any redness or drainage or any swelling. Denies any systemic complaints such as fevers, chills, nausea, vomiting. No acute changes since last appointment, and no other complaints at this time.   Objective: AAO x3, NAD DP/PT pulses palpable bilaterally, CRT less than 3 seconds There is mild tenderness palpation of the plantar medial tubercle of the calcaneus at the insertion of plantar fascia in the left side.  Plantar fascial peers to be intact.  No pain with lateral compression of calcaneus.  No pain with Achilles tendon.  Negative Tinel sign.  No other areas of pinpoint tenderness.   Nails are to be hypertrophic, dystrophic with yellow-brown discoloration.  There is some darkened discoloration is over the fifth digit toenails.  No extension of any hyperpigmentation into the surrounding skin.  No open lesions or pre-ulcerative lesions.  No pain with calf compression, swelling, warmth, erythema  Assessment: Plantar fasciitis, onychomycosis  Plan: -All treatment options discussed with the patient including all alternatives, risks, complications.  -Regards to plantar fasciitis he is making improvement.  Continue with stretching, icing daily.  Continue the night splint, heel cup.  Refill meloxicam to take as needed.  Discussed steroid injection. -Prescribed penlac for onychomycosis.  Discussed side effects and duration of use. -Patient encouraged to call the office with any questions, concerns,  change in symptoms.   Trula Slade DPM

## 2019-04-20 ENCOUNTER — Ambulatory Visit (INDEPENDENT_AMBULATORY_CARE_PROVIDER_SITE_OTHER): Payer: Medicare Other | Admitting: Sports Medicine

## 2019-04-20 ENCOUNTER — Encounter: Payer: Self-pay | Admitting: Sports Medicine

## 2019-04-20 ENCOUNTER — Other Ambulatory Visit: Payer: Self-pay

## 2019-04-20 VITALS — Temp 97.6°F

## 2019-04-20 DIAGNOSIS — B351 Tinea unguium: Secondary | ICD-10-CM | POA: Diagnosis not present

## 2019-04-20 DIAGNOSIS — M722 Plantar fascial fibromatosis: Secondary | ICD-10-CM

## 2019-04-20 DIAGNOSIS — M79671 Pain in right foot: Secondary | ICD-10-CM | POA: Diagnosis not present

## 2019-04-20 NOTE — Progress Notes (Signed)
Subjective: Logan Chambers is a 68 y.o. male patient presents to office with complaint of pain off and on R>L heel at plantar fascia. Reports that meds and stretching has helped. Patient also reports that he has been using night splint as well without any issues. Patient using  Penlac with some improvement in nails. Denies any other pedal complaints.   Patient Active Problem List   Diagnosis Date Noted  . Plantar fasciitis 10/09/2018    Current Outpatient Medications on File Prior to Visit  Medication Sig Dispense Refill  . amLODipine (NORVASC) 5 MG tablet Take 5 mg by mouth daily.     . ciclopirox (PENLAC) 8 % solution Apply topically at bedtime. Apply over nail and surrounding skin. Apply daily over previous coat. After seven (7) days, may remove with alcohol and continue cycle. 6.6 mL 2  . ipratropium (ATROVENT) 0.06 % nasal spray Place 2 sprays into both nostrils 2 (two) times daily.     Marland Kitchen lisinopril (PRINIVIL,ZESTRIL) 20 MG tablet Take 20 mg by mouth daily.  3  . meloxicam (MOBIC) 15 MG tablet Take 1 tablet (15 mg total) by mouth daily. 14 tablet 0  . methocarbamol (ROBAXIN) 500 MG tablet Take 2 tablets (1,000 mg total) by mouth at bedtime as needed for muscle spasms. 20 tablet 0  . ondansetron (ZOFRAN) 4 MG tablet Take 1 tablet (4 mg total) by mouth every 6 (six) hours. 12 tablet 0  . oxyCODONE (ROXICODONE) 5 MG immediate release tablet Take 1 tablet (5 mg total) by mouth every 4 (four) hours as needed for severe pain. 12 tablet 0   No current facility-administered medications on file prior to visit.     No Known Allergies  Objective: Physical Exam General: The patient is alert and oriented x3 in no acute distress.  Dermatology: Skin is warm, dry and supple bilateral lower extremities. Nails 1-10 are mycotic. There is no erythema, edema, no eccymosis, no open lesions present. Integument is otherwise unremarkable.  Vascular: Dorsalis Pedis pulse and Posterior Tibial pulse are 2/4  bilateral. Capillary fill time is immediate to all digits.  Neurological: Grossly intact to light touch with an achilles reflex of +2/5 and a  negative Tinel's sign bilateral.  Musculoskeletal: No reproducible tenderness to palpation at the medial calcaneal tubercale and through the insertion of the plantar fascia on the left or right foot on todays exam. No pain with compression of calcaneus bilateral. No pain with tuning fork to calcaneus bilateral. No pain with calf compression bilateral. There is decreased Ankle joint range of motion bilateral. All other joints range of motion within normal limits bilateral. Strength 5/5 in all groups bilateral. +Pes planus, bunion and hammertoes bil.  Gait: Unassisted, Antalgic avoid weight on left/right heel  Xray, Right/Left foot:  Normal osseous mineralization. Joint spaces preserved. No fracture/dislocation/boney destruction. Calcaneal spur present with mild thickening of plantar fascia. No other soft tissue abnormalities or radiopaque foreign bodies.   Assessment and Plan: Problem List Items Addressed This Visit      Musculoskeletal and Integument   Plantar fasciitis - Primary    Other Visit Diagnoses    Onychomycosis       Right foot pain          -Complete examination performed.  -Discussed with patient in detail the condition of plantar fasciitis, how this occurs and general treatment options. Explained both conservative and surgical treatments.  -Continue night splint -Continue with stretching and icing -Dispensed heel lifts and advised patient to get OTC  inserts -Continue with Mobic until completed -Continue with Penlac for nails -Patient to return to office in 4 weeks with Dr. Jacqualyn Posey for follow up or sooner if problems or questions arise.  Landis Martins, DPM

## 2019-04-20 NOTE — Patient Instructions (Signed)
Air plus insoles from [employer] from [employer] or [employer]

## 2019-05-17 ENCOUNTER — Ambulatory Visit (INDEPENDENT_AMBULATORY_CARE_PROVIDER_SITE_OTHER): Payer: Medicare Other | Admitting: Podiatry

## 2019-05-17 ENCOUNTER — Encounter: Payer: Self-pay | Admitting: Podiatry

## 2019-05-17 ENCOUNTER — Other Ambulatory Visit: Payer: Self-pay

## 2019-05-17 DIAGNOSIS — M722 Plantar fascial fibromatosis: Secondary | ICD-10-CM

## 2019-05-17 DIAGNOSIS — M79675 Pain in left toe(s): Secondary | ICD-10-CM | POA: Diagnosis not present

## 2019-05-17 DIAGNOSIS — B351 Tinea unguium: Secondary | ICD-10-CM

## 2019-05-17 DIAGNOSIS — M79674 Pain in right toe(s): Secondary | ICD-10-CM

## 2019-05-17 MED ORDER — CICLOPIROX 8 % EX SOLN: Freq: Every day | CUTANEOUS | 2 refills | Status: DC

## 2019-05-17 NOTE — Patient Instructions (Signed)
Ciclopirox nail solution What is this medicine? CICLOPIROX (sye kloe PEER ox) NAIL SOLUTION is an antifungal medicine. It used to treat fungal infections of the nails. This medicine may be used for other purposes; ask your health care provider or pharmacist if you have questions. COMMON BRAND NAME(S): [id number], Penlac What should I tell my health care provider before I take this medicine? They need to know if you have any of these conditions:  diabetes mellitus  history of seizures  HIV infection  immune system problems or organ transplant  large areas of burned or damaged skin  peripheral vascular disease or poor circulation  taking corticosteroid medication (including steroid inhalers, cream, or lotion)  an unusual or allergic reaction to ciclopirox, isopropyl alcohol, other medicines, foods, dyes, or preservatives  pregnant or trying to get pregnant  breast-feeding How should I use this medicine? This medicine is for external use only. Follow the directions that come with this medicine exactly. Wash and dry your hands before use. Avoid contact with the eyes, mouth or nose. If you do get this medicine in your eyes, rinse out with plenty of cool tap water. Contact your doctor or health care professional if eye irritation occurs. Use at regular intervals. Do not use your medicine more often than directed. Finish the full course prescribed by your doctor or health care professional even if you think you are better. Do not stop using except on your doctor's advice. Talk to your pediatrician regarding the use of this medicine in children. While this medicine may be prescribed for children as young as 12 years for selected conditions, precautions do apply. Overdosage: If you think you have taken too much of this medicine contact a poison control center or emergency room at once. NOTE: This medicine is only for you. Do not share this medicine with others. What if I miss a dose? If you  miss a dose, use it as soon as you can. If it is almost time for your next dose, use only that dose. Do not use double or extra doses. What may interact with this medicine? Interactions are not expected. Do not use any other skin products without telling your doctor or health care professional. This list may not describe all possible interactions. Give your health care provider a list of all the medicines, herbs, non-prescription drugs, or dietary supplements you use. Also tell them if you smoke, drink alcohol, or use illegal drugs. Some items may interact with your medicine. What should I watch for while using this medicine? Tell your doctor or health care professional if your symptoms get worse. Four to six months of treatment may be needed for the nail(s) to improve. Some people may not achieve a complete cure or clearing of the nails by this time. Tell your doctor or health care professional if you develop sores or blisters that do not heal properly. If your nail infection returns after stopping using this product, contact your doctor or health care professional. What side effects may I notice from receiving this medicine? Side effects that you should report to your doctor or health care professional as soon as possible:  allergic reactions like skin rash, itching or hives, swelling of the face, lips, or tongue  severe irritation, redness, burning, blistering, peeling, swelling, oozing Side effects that usually do not require medical attention (report to your doctor or health care professional if they continue or are bothersome):  mild reddening of the skin  nail discoloration  temporary burning or  mild stinging at the site of application This list may not describe all possible side effects. Call your doctor for medical advice about side effects. You may report side effects to FDA at 1-800-FDA-1088. Where should I keep my medicine? Keep out of the reach of children. Store at room  temperature between 15 and 30 degrees C (59 and 86 degrees F). Do not freeze. Protect from light by storing the bottle in the carton after every use. This medicine is flammable. Keep away from heat and flame. Throw away any unused medicine after the expiration date. NOTE: This sheet is a summary. It may not cover all possible information. If you have questions about this medicine, talk to your doctor, pharmacist, or health care provider.  2020 Elsevier/Gold Standard (2007-11-16 16:49:20) Plantar Fasciitis (Heel Spur Syndrome) with Rehab The plantar fascia is a fibrous, ligament-like, soft-tissue structure that spans the bottom of the foot. Plantar fasciitis is a condition that causes pain in the foot due to inflammation of the tissue. SYMPTOMS   Pain and tenderness on the underneath side of the foot.  Pain that worsens with standing or walking. CAUSES  Plantar fasciitis is caused by irritation and injury to the plantar fascia on the underneath side of the foot. Common mechanisms of injury include:  Direct trauma to bottom of the foot.  Damage to a small nerve that runs under the foot where the main fascia attaches to the heel bone.  Stress placed on the plantar fascia due to bone spurs. RISK INCREASES WITH:   Activities that place stress on the plantar fascia (running, jumping, pivoting, or cutting).  Poor strength and flexibility.  Improperly fitted shoes.  Tight calf muscles.  Flat feet.  Failure to warm-up properly before activity.  Obesity. PREVENTION  Warm up and stretch properly before activity.  Allow for adequate recovery between workouts.  Maintain physical fitness:  Strength, flexibility, and endurance.  Cardiovascular fitness.  Maintain a health body weight.  Avoid stress on the plantar fascia.  Wear properly fitted shoes, including arch supports for individuals who have flat feet.  PROGNOSIS  If treated properly, then the symptoms of plantar fasciitis  usually resolve without surgery. However, occasionally surgery is necessary.  RELATED COMPLICATIONS   Recurrent symptoms that may result in a chronic condition.  Problems of the lower back that are caused by compensating for the injury, such as limping.  Pain or weakness of the foot during push-off following surgery.  Chronic inflammation, scarring, and partial or complete fascia tear, occurring more often from repeated injections.  TREATMENT  Treatment initially involves the use of ice and medication to help reduce pain and inflammation. The use of strengthening and stretching exercises may help reduce pain with activity, especially stretches of the Achilles tendon. These exercises may be performed at home or with a therapist. Your caregiver may recommend that you use heel cups of arch supports to help reduce stress on the plantar fascia. Occasionally, corticosteroid injections are given to reduce inflammation. If symptoms persist for greater than 6 months despite non-surgical (conservative), then surgery may be recommended.   MEDICATION   If pain medication is necessary, then nonsteroidal anti-inflammatory medications, such as aspirin and ibuprofen, or other minor pain relievers, such as acetaminophen, are often recommended.  Do not take pain medication within 7 days before surgery.  Prescription pain relievers may be given if deemed necessary by your caregiver. Use only as directed and only as much as you need.  Corticosteroid injections may be given  by your caregiver. These injections should be reserved for the most serious cases, because they may only be given a certain number of times.  HEAT AND COLD  Cold treatment (icing) relieves pain and reduces inflammation. Cold treatment should be applied for 10 to 15 minutes every 2 to 3 hours for inflammation and pain and immediately after any activity that aggravates your symptoms. Use ice packs or massage the area with a piece of ice (ice  massage).  Heat treatment may be used prior to performing the stretching and strengthening activities prescribed by your caregiver, physical therapist, or athletic trainer. Use a heat pack or soak the injury in warm water.  SEEK IMMEDIATE MEDICAL CARE IF:  Treatment seems to offer no benefit, or the condition worsens.  Any medications produce adverse side effects.  EXERCISES- RANGE OF MOTION (ROM) AND STRETCHING EXERCISES - Plantar Fasciitis (Heel Spur Syndrome) These exercises may help you when beginning to rehabilitate your injury. Your symptoms may resolve with or without further involvement from your physician, physical therapist or athletic trainer. While completing these exercises, remember:   Restoring tissue flexibility helps normal motion to return to the joints. This allows healthier, less painful movement and activity.  An effective stretch should be held for at least 30 seconds.  A stretch should never be painful. You should only feel a gentle lengthening or release in the stretched tissue.  RANGE OF MOTION - Toe Extension, Flexion  Sit with your right / left leg crossed over your opposite knee.  Grasp your toes and gently pull them back toward the top of your foot. You should feel a stretch on the bottom of your toes and/or foot.  Hold this stretch for 10 seconds.  Now, gently pull your toes toward the bottom of your foot. You should feel a stretch on the top of your toes and or foot.  Hold this stretch for 10 seconds. Repeat  times. Complete this stretch 3 times per day.   RANGE OF MOTION - Ankle Dorsiflexion, Active Assisted  Remove shoes and sit on a chair that is preferably not on a carpeted surface.  Place right / left foot under knee. Extend your opposite leg for support.  Keeping your heel down, slide your right / left foot back toward the chair until you feel a stretch at your ankle or calf. If you do not feel a stretch, slide your bottom forward to the  edge of the chair, while still keeping your heel down.  Hold this stretch for 10 seconds. Repeat 3 times. Complete this stretch 2 times per day.   STRETCH  Gastroc, Standing  Place hands on wall.  Extend right / left leg, keeping the front knee somewhat bent.  Slightly point your toes inward on your back foot.  Keeping your right / left heel on the floor and your knee straight, shift your weight toward the wall, not allowing your back to arch.  You should feel a gentle stretch in the right / left calf. Hold this position for 10 seconds. Repeat 3 times. Complete this stretch 2 times per day.  STRETCH  Soleus, Standing  Place hands on wall.  Extend right / left leg, keeping the other knee somewhat bent.  Slightly point your toes inward on your back foot.  Keep your right / left heel on the floor, bend your back knee, and slightly shift your weight over the back leg so that you feel a gentle stretch deep in your back calf.  Hold this position for 10 seconds. Repeat 3 times. Complete this stretch 2 times per day.  STRETCH  Gastrocsoleus, Standing  Note: This exercise can place a lot of stress on your foot and ankle. Please complete this exercise only if specifically instructed by your caregiver.   Place the ball of your right / left foot on a step, keeping your other foot firmly on the same step.  Hold on to the wall or a rail for balance.  Slowly lift your other foot, allowing your body weight to press your heel down over the edge of the step.  You should feel a stretch in your right / left calf.  Hold this position for 10 seconds.  Repeat this exercise with a slight bend in your right / left knee. Repeat 3 times. Complete this stretch 2 times per day.   STRENGTHENING EXERCISES - Plantar Fasciitis (Heel Spur Syndrome)  These exercises may help you when beginning to rehabilitate your injury. They may resolve your symptoms with or without further involvement from your  physician, physical therapist or athletic trainer. While completing these exercises, remember:   Muscles can gain both the endurance and the strength needed for everyday activities through controlled exercises.  Complete these exercises as instructed by your physician, physical therapist or athletic trainer. Progress the resistance and repetitions only as guided.  STRENGTH - Towel Curls  Sit in a chair positioned on a non-carpeted surface.  Place your foot on a towel, keeping your heel on the floor.  Pull the towel toward your heel by only curling your toes. Keep your heel on the floor. Repeat 3 times. Complete this exercise 2 times per day.  STRENGTH - Ankle Inversion  Secure one end of a rubber exercise band/tubing to a fixed object (table, pole). Loop the other end around your foot just before your toes.  Place your fists between your knees. This will focus your strengthening at your ankle.  Slowly, pull your big toe up and in, making sure the band/tubing is positioned to resist the entire motion.  Hold this position for 10 seconds.  Have your muscles resist the band/tubing as it slowly pulls your foot back to the starting position. Repeat 3 times. Complete this exercises 2 times per day.  Document Released: 08/12/2005 Document Revised: 11/04/2011 Document Reviewed: 11/24/2008 [facility] Patient Information 2014 [place], [place].

## 2019-05-18 NOTE — Progress Notes (Signed)
Subjective: 68 year old male presents the office today for concerns and follow-up evaluation of bilateral foot pain plan fasciitis.  Overall states he is doing significantly better.  He is some occasional discomfort.  No swelling.  No radiating pain or weakness.  Overall the toenails are doing well and is asking for refill of the Penlac.  Nails become thickened elongated cannot trim them himself become irritated inside shoes.  Denies any systemic complaints such as fevers, chills, nausea, vomiting. No acute changes since last appointment, and no other complaints at this time.   Objective: AAO x3, NAD DP/PT pulses palpable bilaterally, CRT less than 3 seconds Overall nails are hypertrophic, dystrophic and discolored with yellow-brown discoloration x10.  No edema, erythema or any clinical signs of a bacterial infection.  Subjectively the toenails are causing irritation inside shoes, discomfort. There is no pain to the plantar medial tubercle of the calcaneus at insertion of plantar fascial today.  Plantar fascial peers to be intact.  No pain Achilles tendon. No open lesions or pre-ulcerative lesions.  No pain with calf compression, swelling, warmth, erythema  Assessment: Symptomatic onychomycosis, resolving plantar fasciitis  Plan: -All treatment options discussed with the patient including all alternatives, risks, complications.  -Debrided nails x10 without any complications or bleeding.  Continue Penlac which I refilled today. -Continue stretching, icing exercises daily we discussed power step inserts.  Discussed shoe modifications as well when he is walking. -Patient encouraged to call the office with any questions, concerns, change in symptoms.   Return if symptoms worsen or fail to improve.  Trula Slade DPM

## 2019-05-26 ENCOUNTER — Emergency Department (HOSPITAL_COMMUNITY)
Admission: EM | Admit: 2019-05-26 | Discharge: 2019-05-26 | Disposition: A | Discharge status: Home / Self Care | Payer: Medicare Other | Attending: Emergency Medicine | Admitting: Emergency Medicine

## 2019-05-26 ENCOUNTER — Other Ambulatory Visit: Payer: Self-pay

## 2019-05-26 ENCOUNTER — Encounter (HOSPITAL_COMMUNITY): Payer: Self-pay

## 2019-05-26 DIAGNOSIS — A599 Trichomoniasis, unspecified: Secondary | ICD-10-CM | POA: Diagnosis not present

## 2019-05-26 DIAGNOSIS — F17210 Nicotine dependence, cigarettes, uncomplicated: Secondary | ICD-10-CM | POA: Diagnosis not present

## 2019-05-26 DIAGNOSIS — Z79899 Other long term (current) drug therapy: Secondary | ICD-10-CM | POA: Diagnosis not present

## 2019-05-26 DIAGNOSIS — K852 Alcohol induced acute pancreatitis without necrosis or infection: Secondary | ICD-10-CM | POA: Insufficient documentation

## 2019-05-26 DIAGNOSIS — R112 Nausea with vomiting, unspecified: Secondary | ICD-10-CM | POA: Diagnosis present

## 2019-05-26 DIAGNOSIS — I10 Essential (primary) hypertension: Secondary | ICD-10-CM | POA: Diagnosis not present

## 2019-05-26 LAB — COMPREHENSIVE METABOLIC PANEL
ALT: 17 U/L (ref 0–44)
AST: 29 U/L (ref 15–41)
Albumin: 4.5 g/dL (ref 3.5–5.0)
Alkaline Phosphatase: 71 U/L (ref 38–126)
Anion gap: 13 (ref 5–15)
BUN: 11 mg/dL (ref 8–23)
CO2: 24 mmol/L (ref 22–32)
Calcium: 9.9 mg/dL (ref 8.9–10.3)
Chloride: 92 mmol/L — ABNORMAL LOW (ref 98–111)
Creatinine, Ser: 0.84 mg/dL (ref 0.61–1.24)
GFR calc Af Amer: >60 mL/min (ref >60)
GFR calc non Af Amer: >60 mL/min (ref >60)
Glucose, Bld: 101 mg/dL — ABNORMAL HIGH (ref 70–99)
Potassium: 3.9 mmol/L (ref 3.5–5.1)
Sodium: 129 mmol/L — ABNORMAL LOW (ref 135–145)
Total Bilirubin: 1.4 mg/dL — ABNORMAL HIGH (ref 0.3–1.2)
Total Protein: 8.5 g/dL — ABNORMAL HIGH (ref 6.5–8.1)

## 2019-05-26 LAB — URINALYSIS, ROUTINE W REFLEX MICROSCOPIC
Bilirubin Urine: NEGATIVE
Glucose, UA: 50 mg/dL — AB
Hgb urine dipstick: NEGATIVE
Ketones, ur: 5 mg/dL — AB
Nitrite: NEGATIVE
Protein, ur: 100 mg/dL — AB
Specific Gravity, Urine: 1.024 (ref 1.005–1.030)
pH: 5 (ref 5.0–8.0)

## 2019-05-26 LAB — CBC
HCT: 40.4 % (ref 39.0–52.0)
Hemoglobin: 14.8 g/dL (ref 13.0–17.0)
MCH: 34.7 pg — ABNORMAL HIGH (ref 26.0–34.0)
MCHC: 36.6 g/dL — ABNORMAL HIGH (ref 30.0–36.0)
MCV: 94.6 fL (ref 80.0–100.0)
Platelets: 215 10*3/uL (ref 150–400)
RBC: 4.27 MIL/uL (ref 4.22–5.81)
RDW: 12.7 % (ref 11.5–15.5)
WBC: 7.4 10*3/uL (ref 4.0–10.5)
nRBC: 0 % (ref 0.0–0.2)

## 2019-05-26 LAB — LIPASE, BLOOD: Lipase: 195 U/L — ABNORMAL HIGH (ref 11–51)

## 2019-05-26 MED ORDER — METRONIDAZOLE 500 MG PO TABS: 2000.0000 mg | ORAL_TABLET | Freq: Once | ORAL | 0 refills | Status: AC

## 2019-05-26 MED ORDER — AZITHROMYCIN 250 MG PO TABS
1000.0000 mg | ORAL_TABLET | Freq: Once | ORAL | Status: AC
  Administered 2019-05-26: 18:00:00 1000 mg via ORAL
  Filled 2019-05-26: qty 4

## 2019-05-26 MED ORDER — OXYCODONE HCL 5 MG PO CAPS: 5.0000 mg | ORAL_CAPSULE | Freq: Four times a day (QID) | ORAL | 0 refills | Status: DC | PRN

## 2019-05-26 MED ORDER — OXYCODONE HCL 5 MG PO TABS
5.0000 mg | ORAL_TABLET | Freq: Once | ORAL | Status: AC
  Administered 2019-05-26: 17:00:00 5 mg via ORAL
  Filled 2019-05-26: qty 1

## 2019-05-26 MED ORDER — CEFTRIAXONE SODIUM 250 MG IJ SOLR
250.0000 mg | Freq: Once | INTRAMUSCULAR | Status: AC
  Administered 2019-05-26: 250 mg via INTRAMUSCULAR
  Filled 2019-05-26: qty 250

## 2019-05-26 MED ORDER — ONDANSETRON 4 MG PO TBDP
4.0000 mg | ORAL_TABLET | Freq: Once | ORAL | Status: AC
  Administered 2019-05-26: 17:00:00 4 mg via ORAL
  Filled 2019-05-26: qty 1

## 2019-05-26 MED ORDER — ONDANSETRON 4 MG PO TBDP: 4.0000 mg | ORAL_TABLET | Freq: Three times a day (TID) | ORAL | 0 refills | Status: DC | PRN

## 2019-05-26 NOTE — Discharge Instructions (Signed)
Please read and follow all provided instructions.  Your diagnoses today include:  1. Alcohol-induced acute pancreatitis, unspecified complication status   2. Trichomonas infection     Tests performed today include:  Blood counts and electrolytes  Blood tests to check liver and kidney function  Blood tests to check pancreas function -elevated suggesting mild pancreatitis  Urine test to look for infection - shows signs of trichomonas infection  Vital signs. See below for your results today.   Medications prescribed:   Oxycodone - narcotic pain medication  DO NOT drive or perform any activities that require you to be awake and alert because this medicine can make you drowsy.    Zofran (ondansetron) - for nausea and vomiting   Metronidazole - antibiotic  You have been prescribed an antibiotic medicine: take the entire course of medicine even if you are feeling better. Stopping early can cause the antibiotic not to work. Do not drink alcohol when taking this medication. Combining with alcohol will cause severe vomiting and abdominal pain.   Take any prescribed medications only as directed.  Home care instructions:   Follow any educational materials contained in this packet.  Follow-up instructions: Please follow-up with your primary care provider in the next 3 days for further evaluation of your symptoms.    Return instructions:  SEEK IMMEDIATE MEDICAL ATTENTION IF:  The pain does not go away or becomes severe   A temperature above 101F develops   Repeated vomiting occurs (multiple episodes)   The pain becomes localized to portions of the abdomen. The right side could possibly be appendicitis. In an adult, the left lower portion of the abdomen could be colitis or diverticulitis.   Blood is being passed in stools or vomit (bright red or black tarry stools)   You develop chest pain, difficulty breathing, dizziness or fainting, or become confused, poorly responsive, or  inconsolable (young children)  If you have any other emergent concerns regarding your health  Additional Information: Abdominal (belly) pain can be caused by many things. Your caregiver performed an examination and possibly ordered blood/urine tests and imaging (CT scan, x-rays, ultrasound). Many cases can be observed and treated at home after initial evaluation in the emergency department. Even though you are being discharged home, abdominal pain can be unpredictable. Therefore, you need a repeated exam if your pain does not resolve, returns, or worsens. Most patients with abdominal pain don't have to be admitted to the hospital or have surgery, but serious problems like appendicitis and gallbladder attacks can start out as nonspecific pain. Many abdominal conditions cannot be diagnosed in one visit, so follow-up evaluations are very important.  Your vital signs today were: BP 140/81 (BP Location: Left Arm)    Pulse 85    Temp 98.8 F (37.1 C) (Oral)    Resp 16    Ht 6\' 2"  (1.88 m)    Wt 68 kg    SpO2 100%    BMI 19.26 kg/m  If your blood pressure (bp) was elevated above 135/85 this visit, please have this repeated by your doctor within one month. --------------

## 2019-05-26 NOTE — ED Triage Notes (Signed)
Pt arrives POV for eval of abd pain onset on Sunday. Pt reports he has a hx of "something with his pancreas". Endorses nausea/mild vomiting. Denies diarrhea/fever/chills.

## 2019-05-26 NOTE — ED Provider Notes (Signed)
[facility] EMERGENCY DEPARTMENT Provider Note   CSN: [id number] Arrival date & time: 05/26/19  1154     History   Chief Complaint Chief Complaint  Patient presents with  . Abdominal Pain    HPI Logan Chambers is a 68 y.o. male.     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  Patient reports that he has cut back his drinking since his last episode of pancreatitis.  Although he admits to drinking more than usual over the weekend.  2 days ago he developed pain with some nausea and occasional vomiting.  He has been able to keep down fluids.  No lower abdominal pain.  No fevers or chills.  No chest pain or shortness of breath.  No bowel changes or difficulty or pain with urination.  No treatments prior to arrival.     Past Medical History:  Diagnosis Date  . Arm fracture, left   . Arthritis   . Hypertension     Patient Active Problem List   Diagnosis Date Noted  . Plantar fasciitis 10/09/2018    History reviewed. No pertinent surgical history.      Home Medications    Prior to Admission medications   Medication Sig Start Date End Date Taking? Authorizing Provider  amLODipine (NORVASC) 5 MG tablet Take 5 mg by mouth daily.  05/18/18   [provider]  ciclopirox (PENLAC) 8 % solution Apply topically at bedtime. Apply over nail and surrounding skin. Apply daily over previous coat. After seven (7) days, may remove with alcohol and continue cycle. 05/17/19   Trula Slade, DPM  ipratropium (ATROVENT) 0.06 % nasal spray Place 2 sprays into both nostrils 2 (two) times daily.  06/23/18   [provider]  lisinopril (PRINIVIL,ZESTRIL) 20 MG tablet Take 20 mg by mouth daily. 01/19/18   [provider]  meloxicam (MOBIC) 15 MG tablet Take 1 tablet (15 mg total) by  mouth daily. 03/09/19 03/08/20  Trula Slade, DPM  methocarbamol (ROBAXIN) 500 MG tablet Take 2 tablets (1,000 mg total) by mouth at bedtime as needed for muscle spasms. 03/24/17   Fatima Blank, MD  ondansetron (ZOFRAN) 4 MG tablet Take 1 tablet (4 mg total) by mouth every 6 (six) hours. 11/03/18   Virgel Manifold, MD  oxyCODONE (ROXICODONE) 5 MG immediate release tablet Take 1 tablet (5 mg total) by mouth every 4 (four) hours as needed for severe pain. 11/03/18   Virgel Manifold, MD    Family History History reviewed. No pertinent family history.  Social History Social History   Tobacco Use  . Smoking status: Current Every Day Smoker    Types: Cigarettes  . Smokeless tobacco: Never Used  Substance Use Topics  . Alcohol use: Yes    Comment: 2 40 oz per day   . Drug use: No     Allergies   Patient has no known allergies.   Review of Systems Review of Systems  Constitutional: Negative for fever.  HENT: Negative for rhinorrhea and sore throat.   Eyes: Negative for redness.  Respiratory: Negative for cough and shortness of breath.   Cardiovascular: Negative for chest pain.  Gastrointestinal: Positive for abdominal pain, nausea and vomiting. Negative for diarrhea.  Genitourinary: Negative for dysuria.  Musculoskeletal: Negative for myalgias.  Skin: Negative for rash.  Neurological: Negative for headaches.  Physical Exam Updated Vital Signs BP 140/81 (BP Location: Left Arm)   Pulse 85   Temp 98.8 F (37.1 C) (Oral)   Resp 16   Ht 6\' 2"  (1.88 m)   Wt 68 kg   SpO2 100%   BMI 19.26 kg/m   Physical Exam Vitals signs and nursing note reviewed.  Constitutional:      Appearance: He is well-developed.  HENT:     Head: Normocephalic and atraumatic.  Eyes:     General:        Right eye: No discharge.        Left eye: No discharge.     Conjunctiva/sclera: Conjunctivae normal.  Neck:     Musculoskeletal: Normal range of motion and neck supple.   Cardiovascular:     Rate and Rhythm: Normal rate and regular rhythm.     Heart sounds: Normal heart sounds.  Pulmonary:     Effort: Pulmonary effort is normal.     Breath sounds: Normal breath sounds.  Abdominal:     Palpations: Abdomen is soft.     Tenderness: There is abdominal tenderness (mild) in the right upper quadrant, epigastric area and left upper quadrant. There is no guarding or rebound.  Skin:    General: Skin is warm and dry.  Neurological:     Mental Status: He is alert.      ED Treatments / Results  Labs (all labs ordered are listed, but only abnormal results are displayed) Labs Reviewed  LIPASE, BLOOD - Abnormal; Notable for the following components:      Result Value   Lipase 195 (*)    All other components within normal limits  COMPREHENSIVE METABOLIC PANEL - Abnormal; Notable for the following components:   Sodium 129 (*)    Chloride 92 (*)    Glucose, Bld 101 (*)    Total Protein 8.5 (*)    Total Bilirubin 1.4 (*)    All other components within normal limits  CBC - Abnormal; Notable for the following components:   MCH 34.7 (*)    MCHC 36.6 (*)    All other components within normal limits  URINALYSIS, ROUTINE W REFLEX MICROSCOPIC - Abnormal; Notable for the following components:   Color, Urine AMBER (*)    APPearance CLOUDY (*)    Glucose, UA 50 (*)    Ketones, ur 5 (*)    Protein, ur 100 (*)    Leukocytes,Ua LARGE (*)    Bacteria, UA RARE (*)    Trichomonas, UA PRESENT (*)    All other components within normal limits  GC/CHLAMYDIA PROBE AMP ([place]) NOT AT [facility]    EKG None  Radiology No results found.  Procedures Procedures (including critical care time)  Medications Ordered in ED Medications  cefTRIAXone (ROCEPHIN) injection 250 mg (has no administration in time range)  azithromycin (ZITHROMAX) tablet 1,000 mg (has no administration in time range)  oxyCODONE (Oxy IR/ROXICODONE) immediate release tablet 5 mg (5 mg Oral Given  05/26/19 1649)  ondansetron (ZOFRAN-ODT) disintegrating tablet 4 mg (4 mg Oral Given 05/26/19 1649)     Initial Impression / Assessment and Plan / ED Course  I have reviewed the triage vital signs and the nursing notes.  Pertinent labs & imaging results that were available during my care of the patient were reviewed by me and considered in my medical decision making (see chart for details).        Patient seen and examined.  Reviewed previous history and labs.  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  On exam he looks quite comfortable.  Minimal tenderness on exam.  No active vomiting.  Patient appears well-hydrated.  Lab work with normal white blood cell count, normal red blood cell count.  He has mildly elevated lipase today.    Vital signs reviewed and are as follows: BP 140/81 (BP Location: Left Arm)   Pulse 85   Temp 98.8 F (37.1 C) (Oral)   Resp 16   Ht 6\' 2"  (1.88 m)   Wt 68 kg   SpO2 100%   BMI 19.26 kg/m   6:05 PM patient discussed with and seen by Dr. Maryan Rued.  On reexam, patient has tolerated water and is requesting more to drink.  He is given some Sprite.  We discussed his UA results.  He currently denies any dysuria or penile discharge.  Will treat for trichomonas, gonorrhea and chlamydia.  Will give prescription for metronidazole to take after his abdominal pain improves.  We discussed that he cannot take this medication and drink alcohol as it will cause him to become very very sick and vomit.  He verbalizes understanding and states that he will avoid drinking for a few days at least.  We discussed clear liquids at home for the next 24 hours and then bland diet after this.  Abdomen remains soft without signs of peritonitis on reexam.  Patient counseled on use of narcotic pain medications. Counseled not to combine these medications with others containing tylenol. Urged not to drink alcohol, drive, or perform any other  activities that requires focus while taking these medications. The patient verbalizes understanding and agrees with the plan.  The patient was urged to return to the Emergency Department immediately with worsening of current symptoms, worsening abdominal pain, persistent vomiting, blood noted in stools, fever, or any other concerns. The patient verbalized understanding.    Final Clinical Impressions(s) / ED Diagnoses   Final diagnoses:  Alcohol-induced acute pancreatitis, unspecified complication status  Trichomonas infection   Patient with epigastric abdominal pain in setting of recent alcohol use, elevated lipase consistent with alcohol these pancreatitis.  Patient is in no distress.  He has had no vomiting in the emergency department.  He is tolerating oral fluids and pain is controlled on oral medications.  Comfortable with discharged home at this time.  No indications for CT imaging at this point.  Trichomonas infection incidentally noted on UA.  Patient treated empirically for GC/chlamydia.  He is given a prescription for metronidazole as above.   ED Discharge Orders         Ordered    oxycodone (OXY-IR) 5 MG capsule  Every 6 hours PRN     05/26/19 1810    ondansetron (ZOFRAN ODT) 4 MG disintegrating tablet  Every 8 hours PRN     05/26/19 1810    metroNIDAZOLE (FLAGYL) 500 MG tablet   Once     05/26/19 1810           Carlisle Cater, PA-C 05/26/19 1908    Blanchie Dessert, MD 05/28/19 [phone]

## 2019-07-02 ENCOUNTER — Emergency Department (HOSPITAL_COMMUNITY): Payer: Medicare Other

## 2019-07-02 ENCOUNTER — Emergency Department (HOSPITAL_COMMUNITY)
Admission: EM | Admit: 2019-07-02 | Discharge: 2019-07-02 | Disposition: A | Discharge status: Home / Self Care | Payer: Medicare Other | Attending: Emergency Medicine | Admitting: Emergency Medicine

## 2019-07-02 ENCOUNTER — Other Ambulatory Visit: Payer: Self-pay

## 2019-07-02 DIAGNOSIS — Y92410 Unspecified street and highway as the place of occurrence of the external cause: Secondary | ICD-10-CM | POA: Insufficient documentation

## 2019-07-02 DIAGNOSIS — R519 Headache, unspecified: Secondary | ICD-10-CM | POA: Insufficient documentation

## 2019-07-02 DIAGNOSIS — S42034A Nondisplaced fracture of lateral end of right clavicle, initial encounter for closed fracture: Secondary | ICD-10-CM

## 2019-07-02 DIAGNOSIS — Y999 Unspecified external cause status: Secondary | ICD-10-CM | POA: Insufficient documentation

## 2019-07-02 DIAGNOSIS — I10 Essential (primary) hypertension: Secondary | ICD-10-CM | POA: Diagnosis not present

## 2019-07-02 DIAGNOSIS — S8991XA Unspecified injury of right lower leg, initial encounter: Secondary | ICD-10-CM | POA: Diagnosis present

## 2019-07-02 DIAGNOSIS — Z79899 Other long term (current) drug therapy: Secondary | ICD-10-CM | POA: Diagnosis not present

## 2019-07-02 DIAGNOSIS — Y939 Activity, unspecified: Secondary | ICD-10-CM | POA: Insufficient documentation

## 2019-07-02 DIAGNOSIS — F17210 Nicotine dependence, cigarettes, uncomplicated: Secondary | ICD-10-CM | POA: Diagnosis not present

## 2019-07-02 DIAGNOSIS — S82831A Other fracture of upper and lower end of right fibula, initial encounter for closed fracture: Secondary | ICD-10-CM | POA: Diagnosis not present

## 2019-07-02 NOTE — ED Notes (Signed)
Dc paperwork given to pt, pt verbalizes understanding.
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
## 2019-07-02 NOTE — Progress Notes (Signed)
Orthopedic Tech Progress Note Patient Details:  Logan Chambers 24-Jun-1951 [id number]  Ortho Devices Type of Ortho Device: CAM walker Ortho Device/Splint Location: right Ortho Device/Splint Interventions: Application   Post Interventions Patient Tolerated: Well Instructions Provided: Care of device   [place] Pink 07/02/2019, 3:55 PM

## 2019-07-02 NOTE — Discharge Instructions (Addendum)
As discussed, you have been diagnosed with a fracture of the fibula, or small bone in your lower leg, and your clavicle. It is important that you schedule a follow-up appointment with our orthopedic colleagues next week.  Monitor your condition carefully, take ibuprofen and Tylenol for pain control, and do not hesitate to return here for concerning changes in your condition.
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
## 2019-07-02 NOTE — ED Notes (Signed)
Ortho called for cam walker

## 2019-07-09 ENCOUNTER — Other Ambulatory Visit: Payer: Self-pay

## 2019-07-09 ENCOUNTER — Ambulatory Visit (INDEPENDENT_AMBULATORY_CARE_PROVIDER_SITE_OTHER): Payer: Medicare Other | Admitting: Orthopaedic Surgery

## 2019-07-09 ENCOUNTER — Encounter: Payer: Self-pay | Admitting: Orthopaedic Surgery

## 2019-07-09 DIAGNOSIS — S82831A Other fracture of upper and lower end of right fibula, initial encounter for closed fracture: Secondary | ICD-10-CM

## 2019-07-09 DIAGNOSIS — S42001A Fracture of unspecified part of right clavicle, initial encounter for closed fracture: Secondary | ICD-10-CM | POA: Diagnosis not present

## 2019-07-09 MED ORDER — TRAMADOL HCL 50 MG PO TABS: 50.0000 mg | ORAL_TABLET | Freq: Four times a day (QID) | ORAL | 0 refills | Status: DC | PRN
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
## 2019-07-27 ENCOUNTER — Ambulatory Visit: Payer: Self-pay

## 2019-07-27 ENCOUNTER — Encounter: Payer: Self-pay | Admitting: Orthopaedic Surgery

## 2019-07-27 ENCOUNTER — Ambulatory Visit (INDEPENDENT_AMBULATORY_CARE_PROVIDER_SITE_OTHER): Payer: Medicare Other | Admitting: Orthopaedic Surgery

## 2019-07-27 ENCOUNTER — Other Ambulatory Visit: Payer: Self-pay

## 2019-07-27 DIAGNOSIS — S42001A Fracture of unspecified part of right clavicle, initial encounter for closed fracture: Secondary | ICD-10-CM | POA: Diagnosis not present

## 2019-07-27 DIAGNOSIS — S82831A Other fracture of upper and lower end of right fibula, initial encounter for closed fracture: Secondary | ICD-10-CM
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
## 2019-07-30 ENCOUNTER — Encounter: Payer: Self-pay | Admitting: Physical Therapy

## 2019-07-30 ENCOUNTER — Ambulatory Visit: Payer: Medicare Other | Attending: Physician Assistant | Admitting: Physical Therapy

## 2019-07-30 ENCOUNTER — Other Ambulatory Visit: Payer: Self-pay

## 2019-07-30 DIAGNOSIS — M79604 Pain in right leg: Secondary | ICD-10-CM | POA: Diagnosis present

## 2019-07-30 DIAGNOSIS — G8929 Other chronic pain: Secondary | ICD-10-CM | POA: Diagnosis present

## 2019-07-30 DIAGNOSIS — M25612 Stiffness of left shoulder, not elsewhere classified: Secondary | ICD-10-CM | POA: Diagnosis present

## 2019-07-30 DIAGNOSIS — M25511 Pain in right shoulder: Secondary | ICD-10-CM | POA: Diagnosis present

## 2019-07-30 DIAGNOSIS — M6281 Muscle weakness (generalized): Secondary | ICD-10-CM | POA: Insufficient documentation

## 2019-07-30 DIAGNOSIS — M25512 Pain in left shoulder: Secondary | ICD-10-CM | POA: Insufficient documentation
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
## 2019-08-06 ENCOUNTER — Encounter

## 2019-08-11 ENCOUNTER — Other Ambulatory Visit: Payer: Self-pay

## 2019-08-11 ENCOUNTER — Encounter: Payer: Self-pay | Admitting: Physical Therapy

## 2019-08-11 ENCOUNTER — Ambulatory Visit: Payer: Medicare Other | Admitting: Physical Therapy

## 2019-08-11 DIAGNOSIS — M25511 Pain in right shoulder: Secondary | ICD-10-CM

## 2019-08-11 DIAGNOSIS — M79604 Pain in right leg: Secondary | ICD-10-CM
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
## 2019-08-13 ENCOUNTER — Encounter: Payer: Self-pay | Admitting: Physical Therapy

## 2019-08-13 ENCOUNTER — Other Ambulatory Visit: Payer: Self-pay

## 2019-08-13 ENCOUNTER — Ambulatory Visit: Payer: Medicare Other | Admitting: Physical Therapy

## 2019-08-13 DIAGNOSIS — M79604 Pain in right leg: Secondary | ICD-10-CM

## 2019-08-13 DIAGNOSIS — M25511 Pain in right shoulder: Secondary | ICD-10-CM | POA: Diagnosis not present
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
## 2019-08-13 NOTE — Patient Instructions (Signed)
Access Code: [email or web address]  URL: [email or web address]  Date: 08/13/2019  Prepared by: Hilda Blades   Exercises Seated Hamstring Stretch - 3 reps - 30 seconds hold - 5x daily Long Sitting Quad Set with Towel Roll Under Heel - 10 reps - 3 seconds hold - 5x daily - 7x weekly

## 2019-08-16 ENCOUNTER — Encounter: Payer: Self-pay | Admitting: Physical Therapy

## 2019-08-16 ENCOUNTER — Ambulatory Visit: Payer: Medicare Other | Admitting: Physical Therapy

## 2019-08-16 ENCOUNTER — Other Ambulatory Visit: Payer: Self-pay

## 2019-08-16 DIAGNOSIS — M79604 Pain in right leg: Secondary | ICD-10-CM

## 2019-08-16 DIAGNOSIS — M25511 Pain in right shoulder: Secondary | ICD-10-CM | POA: Diagnosis not present

## 2019-08-16 DIAGNOSIS — G8929 Other chronic pain: Secondary | ICD-10-CM

## 2019-08-16 DIAGNOSIS — M6281 Muscle weakness (generalized): Secondary | ICD-10-CM

## 2019-08-16 DIAGNOSIS — M25612 Stiffness of left shoulder, not elsewhere classified: Secondary | ICD-10-CM

## 2019-08-16 NOTE — Therapy (Signed)
[place], [place], [address] Phone: [phone]   Fax:  [phone]  Physical Therapy Treatment  Patient Details  Name: Logan Chambers MRN: [id number] Date of Birth: 09-May-1951 Referring Provider (PT): Aundra Dubin, [place]   Encounter Date: 08/16/2019  PT End of Session - 08/16/19 1102    Visit Number  4    Number of Visits  13    Date for PT Re-Evaluation  09/10/19    Authorization Type  UHC MCR    PT Start Time  1101    PT Stop Time  1144    PT Time Calculation (min)  43 min    Activity Tolerance  Patient tolerated treatment well    Behavior During Therapy  [facility] for tasks assessed/performed       Past Medical History:  Diagnosis Date  . Arm fracture, left   . Arthritis   . Hypertension     History reviewed. No pertinent surgical history.  There were no vitals filed for this visit.  Subjective Assessment - 08/16/19 1104    Subjective  " the bone in my knee is giving me more issue especially in the morning. The shoulder is pretty much good now. I see the MD tomorrow for the x-ray"                       [facility] Adult PT Treatment/Exercise - 08/16/19 1107      Knee/Hip Exercises: Aerobic   Nustep  L4 x5 min UE/LE      Knee/Hip Exercises: Standing   Gait Training  reviewed proper gait pattern utilizing heel strike/ toe off      Knee/Hip Exercises: Seated   Long Arc Quad  2 sets;15 reps;Both   ith balls squeeze    Long Arc Quad Weight  3 lbs.      Knee/Hip Exercises: Supine   Quad Sets  2 sets;10 reps   with ball squeeze   Straight Leg Raises  Strengthening;2 sets;15 reps;Right      Shoulder Exercises: Standing   External Rotation  Strengthening;12 reps;Right   x 2 sets   Theraband Level (Shoulder External Rotation)  Level 2 (Red)    Internal Rotation  Strengthening;12 reps;Theraband;Right   x 2 sets   Theraband Level (Shoulder Internal Rotation)  Level 2 (Red)    Flexion  Strengthening;Both;10 reps   x 2 with no weight   Flexion Limitations  cues to avoid going above shoulder height    Row  Strengthening;Right;12 reps;Theraband    Theraband Level (Shoulder Row)  Level 3 (Green)      Manual Therapy   Manual Therapy  Soft tissue mobilization    Manual therapy comments  MTPR along the R upper trap/ levator scapuale x 2    Soft tissue mobilization  IASTM along the R upper trap and the medial retinaculum of the R knee                  PT Long Term Goals - 07/30/19 1016      PT LONG TERM GOAL #1   Title  Pt will demo full Rt GHJ AROM    Baseline  limited to shoulder height at eval    Time  6    Period  Weeks    Status  New    Target Date  09/10/19      PT LONG TERM GOAL #2   Title  pt will ambulate  without antalgic pattern    Baseline  antalgic on Rt.    Time  6    Period  Weeks    Status  New    Target Date  09/10/19      PT LONG TERM GOAL #3   Title  pt will be able to lift and carry daily objects without limitation by shoulder    Baseline  avoiding use right now    Time  6    Period  Weeks    Status  New    Target Date  09/10/19            Plan - 08/16/19 1147    Clinical Impression Statement  [name] reports no pain inthe shoulder and notes mild sorenss in the R knee located on the medial aspect along the medial retinaculum. pt demonstrates no limtation with RUE ROM with no pain, and expressed wanting to work on Bent. he did well with shoulder strengthening focusing on light resistance with good form. utilzed STW along the medial retinaculum and knee extension strengthening in at various angles. He noted no pain or issues throughout or following the session reporting no pain with walk/standing inthe knee.    PT Treatment/Interventions  ADLs/Self Care Home Management;Cryotherapy;Electrical Stimulation;Moist Heat;Functional mobility training;Therapeutic activities;Therapeutic exercise;Patient/family  education;Neuromuscular re-education;Manual techniques;Passive range of motion;Dry needling;Taping    PT Next Visit Plan  PROM & pendulums only for Rt UE per MD, gait training- heel strike & knee extension    PT Home Exercise Plan  pendulums, upright posture/scap retraction, seated hamstring stretch, supine quad set with heel prop, frequent walks around home with focus on gait form    Consulted and Agree with Plan of Care  Patient       Patient will benefit from skilled therapeutic intervention in order to improve the following deficits and impairments:  Abnormal gait, Decreased range of motion, Difficulty walking, Impaired UE functional use, Decreased activity tolerance, Pain, Improper body mechanics, Postural dysfunction  Visit Diagnosis: Acute pain of right shoulder  Pain in right leg  Chronic left shoulder pain  Stiffness of left shoulder, not elsewhere classified  Muscle weakness (generalized)     Problem List Patient Active Problem List   Diagnosis Date Noted  . Plantar fasciitis 10/09/2018   Starr Lake PT, DPT, LAT, ATC  08/16/19  11:52 AM      [facility] [address] [place], [place], [address] Phone: [phone]   Fax:  [phone]  Name: Logan Chambers MRN: [id number] Date of Birth: [birth year]

## 2019-08-17 ENCOUNTER — Encounter: Payer: Self-pay | Admitting: Orthopaedic Surgery

## 2019-08-17 ENCOUNTER — Ambulatory Visit: Payer: Self-pay

## 2019-08-17 ENCOUNTER — Ambulatory Visit (INDEPENDENT_AMBULATORY_CARE_PROVIDER_SITE_OTHER): Payer: Medicare Other | Admitting: Orthopaedic Surgery

## 2019-08-17 DIAGNOSIS — S42001A Fracture of unspecified part of right clavicle, initial encounter for closed fracture: Secondary | ICD-10-CM | POA: Diagnosis not present

## 2019-08-17 DIAGNOSIS — S82831A Other fracture of upper and lower end of right fibula, initial encounter for closed fracture: Secondary | ICD-10-CM | POA: Diagnosis not present

## 2019-08-17 NOTE — Progress Notes (Signed)
   Office Visit Note   Patient: Logan Chambers           Date of Birth: 1951-01-06           MRN: [id number] Visit Date: 08/17/2019              Requested by: Velna Hatchet, MD [address] [place],  [place] [address] PCP: Velna Hatchet, MD   Assessment & Plan: Visit Diagnoses:  1. Closed nondisplaced fracture of right clavicle, unspecified part of clavicle, initial encounter   2. Closed fracture of proximal end of right fibula, unspecified fracture morphology, initial encounter     Plan: Impression is 6 weeks status post right proximal fibula and distal clavicle fractures.  The patient will continue with physical therapy for his clavicle fracture to work on strengthening exercises.  No lifting more than 10 pounds for the next 4 weeks.  He will follow-up with [place] in 4 weeks time for repeat evaluation and 2 view x-rays of the right clavicle.  Follow-Up Instructions: Return in about 4 weeks (around 09/14/2019).   Orders:  Orders Placed This Encounter  Procedures  . XR Clavicle Right  . XR Tibia/Fibula Right   No orders of the defined types were placed in this encounter.     Procedures: No procedures performed   Clinical Data: No additional findings.   Subjective: Chief Complaint  Patient presents with  . Right Shoulder - Follow-up  . Right Leg - Follow-up    HPI patient is a pleasant 68 year old gentleman who presents our clinic today 6 weeks out right distal clavicle and right proximal fibula fractures.  He has been doing well.  He has been in physical therapy.  Only pain he has is to the medial aspect of the right knee.  No pain to the proximal fibula or clavicle.  Review of Systems as detailed in HPI.  All others reviewed and are negative.   Objective: Vital Signs: There were no vitals taken for this visit.  Physical Exam well-developed well-nourished gentleman in no acute distress.  Alert and oriented x3.  Ortho Exam examination of the right proximal fibula  reveals no tenderness.  Full range of motion of the knee.  Is neurovascular intact distally.  Right clavicle exhibits no bony tenderness.  Has full range of motion of the shoulder.  He is neurovascular intact distally.  Specialty Comments:  No specialty comments available.  Imaging: XR Clavicle Right  Result Date: 08/17/2019 Distal clavicle fracture shows moderate bony resorption  XR Tibia/Fibula Right  Result Date: 08/17/2019 Nearly healed proximal fibula fracture with callous formation    PMFS History: Patient Active Problem List   Diagnosis Date Noted  . Plantar fasciitis 10/09/2018   Past Medical History:  Diagnosis Date  . Arm fracture, left   . Arthritis   . Hypertension     History reviewed. No pertinent family history.  History reviewed. No pertinent surgical history. Social History   Occupational History  . Not on file  Tobacco Use  . Smoking status: Current Every Day Smoker    Types: Cigarettes  . Smokeless tobacco: Never Used  Substance and Sexual Activity  . Alcohol use: Yes    Comment: 2 40 oz per day   . Drug use: No  . Sexual activity: Not on file

## 2019-08-18 ENCOUNTER — Ambulatory Visit: Payer: Medicare Other | Admitting: Physical Therapy

## 2019-08-18 ENCOUNTER — Encounter: Payer: Self-pay | Admitting: Physical Therapy

## 2019-08-18 ENCOUNTER — Other Ambulatory Visit: Payer: Self-pay

## 2019-08-18 DIAGNOSIS — M79604 Pain in right leg: Secondary | ICD-10-CM

## 2019-08-18 DIAGNOSIS — M25511 Pain in right shoulder: Secondary | ICD-10-CM | POA: Diagnosis not present

## 2019-08-18 NOTE — Therapy (Addendum)
[place], [place], [address] Phone: [phone]   Fax:  [phone]  Physical Therapy Treatment  Patient Details  Name: Logan Chambers MRN: [id number] Date of Birth: 1951-05-14 Referring Provider (PT): Aundra Dubin, [place]   Encounter Date: 08/18/2019  PT End of Session - 08/18/19 0801    Visit Number  5    Number of Visits  13    Date for PT Re-Evaluation  09/10/19    Authorization Type  UHC MCR    PT Start Time  0800    PT Stop Time  0838    PT Time Calculation (min)  38 min    Activity Tolerance  Patient tolerated treatment well    Behavior During Therapy  [facility] for tasks assessed/performed       Past Medical History:  Diagnosis Date  . Arm fracture, left   . Arthritis   . Hypertension     History reviewed. No pertinent surgical history.  There were no vitals filed for this visit.  Subjective Assessment - 08/18/19 0804    Subjective  It wasnt bad at all after last visit. Medial knee pain today when I step on Rt leg    Currently in Pain?  Yes    Pain Score  7     Pain Location  Knee    Pain Orientation  Right    Pain Descriptors / Indicators  Sore    Aggravating Factors   weight bearing         [facility] PT Assessment - 08/18/19 0001      Palpation   Palpation comment  Rt medial joint line is not TTP but that is the area he complains of                   [facility] Adult PT Treatment/Exercise - 08/18/19 0001      Knee/Hip Exercises: Stretches   Passive Hamstring Stretch Limitations  seated in chair    Other Knee/Hip Stretches  lunge stretch at counter      Knee/Hip Exercises: Supine   Bridges  20 reps      Knee/Hip Exercises: Sidelying   Hip ABduction  20 reps;10 reps;Right    Clams  x30 Rt      Shoulder Exercises: Standing   External Rotation  Strengthening    Theraband Level (Shoulder External Rotation)  Level 2 (Red)    Internal Rotation  Strengthening    Theraband Level (Shoulder Internal Rotation)  Level 2 (Red)    Row  Strengthening    Theraband Level (Shoulder Row)  Level 3 (Green)      Manual Therapy   Soft tissue mobilization  Rt upper trap & levator                  PT Long Term Goals - 07/30/19 1016      PT LONG TERM GOAL #1   Title  Pt will demo full Rt GHJ AROM    Baseline  limited to shoulder height at eval    Time  6    Period  Weeks    Status  New    Target Date  09/10/19      PT LONG TERM GOAL #2   Title  pt will ambulate without antalgic pattern    Baseline  antalgic on Rt.    Time  6    Period  Weeks    Status  New    Target Date  09/10/19      PT LONG TERM GOAL #3   Title  pt will be able to lift and carry daily objects without limitation by shoulder    Baseline  avoiding use right now    Time  6    Period  Weeks    Status  New    Target Date  09/10/19            Plan - 08/18/19 [id number]    Clinical Impression Statement  Discussed with pt that his knee looks great on the xray and how weakness in the hip can cause medial knee pain, he verbalized understanding. Added band strengtheing to HEP as he said it felt really good last time. trigger points in Rt levator & upper trap reduced with STM and encouraged him to performupright posture to avoid return of tightness.    PT Treatment/Interventions  ADLs/Self Care Home Management;Cryotherapy;Electrical Stimulation;Moist Heat;Functional mobility training;Therapeutic activities;Therapeutic exercise;Patient/family education;Neuromuscular re-education;Manual techniques;Passive range of motion;Dry needling;Taping    PT Home Exercise Plan  pendulums, upright posture/scap retraction, seated hamstring stretch, supine quad set with heel prop, frequent walks around home with focus on gait form, GHJ: ER, IR, rows; SL hip abd, clams    Consulted and Agree with Plan of Care  Patient       Patient will benefit from skilled therapeutic intervention in order to  improve the following deficits and impairments:  Abnormal gait, Decreased range of motion, Difficulty walking, Impaired UE functional use, Decreased activity tolerance, Pain, Improper body mechanics, Postural dysfunction  Visit Diagnosis: Acute pain of right shoulder  Pain in right leg     Problem List Patient Active Problem List   Diagnosis Date Noted  . Plantar fasciitis 10/09/2018   Mariell Nester C. Khrista Braun PT, DPT 08/18/19 8:46 AM   [facility] [address] [place], [place], [address] Phone: [phone]   Fax:  [phone]  Name: Logan Chambers MRN: [id number] Date of Birth: [birth year]

## 2019-08-23 ENCOUNTER — Ambulatory Visit: Payer: Medicare Other | Admitting: Physical Therapy

## 2019-08-25 ENCOUNTER — Ambulatory Visit: Payer: Medicare Other | Admitting: Physical Therapy

## 2019-09-09 ENCOUNTER — Encounter: Payer: Self-pay | Admitting: Physical Therapy

## 2019-09-09 ENCOUNTER — Ambulatory Visit: Payer: Medicare Other | Attending: Physician Assistant | Admitting: Physical Therapy

## 2019-09-09 ENCOUNTER — Other Ambulatory Visit: Payer: Self-pay

## 2019-09-09 DIAGNOSIS — M25612 Stiffness of left shoulder, not elsewhere classified: Secondary | ICD-10-CM | POA: Insufficient documentation

## 2019-09-09 DIAGNOSIS — M6281 Muscle weakness (generalized): Secondary | ICD-10-CM | POA: Diagnosis present

## 2019-09-09 DIAGNOSIS — M79604 Pain in right leg: Secondary | ICD-10-CM | POA: Diagnosis present

## 2019-09-09 DIAGNOSIS — M25511 Pain in right shoulder: Secondary | ICD-10-CM | POA: Diagnosis present

## 2019-09-09 DIAGNOSIS — M25512 Pain in left shoulder: Secondary | ICD-10-CM | POA: Insufficient documentation

## 2019-09-09 DIAGNOSIS — G8929 Other chronic pain: Secondary | ICD-10-CM | POA: Insufficient documentation

## 2019-09-09 NOTE — Therapy (Signed)
[place], [place], [address] Phone: [phone]   Fax:  [phone]  Physical Therapy Treatment  Progress Note Reporting Period 07/30/2019 to 09/09/2019  See note below for Objective Data and Assessment of Progress/Goals.    Patient Details  Name: Logan Chambers MRN: [id number] Date of Birth: Jan 04, 1951 Referring Provider (PT): Aundra Dubin, [place]   Encounter Date: 09/09/2019  PT End of Session - 09/09/19 0840    Visit Number  6    Number of Visits  12    Date for PT Re-Evaluation  10/21/19    Authorization Type  UHC MCR    PT Start Time  0830    PT Stop Time  0912    PT Time Calculation (min)  42 min    Activity Tolerance  Patient tolerated treatment well    Behavior During Therapy  [facility] for tasks assessed/performed       Past Medical History:  Diagnosis Date  . Arm fracture, left   . Arthritis   . Hypertension     History reviewed. No pertinent surgical history.  There were no vitals filed for this visit.  Subjective Assessment - 09/09/19 0835    Subjective  Patient reports his shoulder is doing well. He hasn't been lifting anything heavy with it. His right knee continues to bother him especially when t is cold.    Patient Stated Goals  walk better, move shoulder more    Currently in Pain?  Yes    Pain Score  2     Pain Location  Knee    Pain Orientation  Right    Pain Descriptors / Indicators  Aching;Sore;Tightness    Pain Type  Acute pain    Pain Onset  More than a month ago    Pain Frequency  Intermittent         [facility] PT Assessment - 09/09/19 0001      Assessment   Medical Diagnosis  Rt clavicle fracture, Rt proximal fibula fracture    Referring Provider (PT)  Aundra Dubin, PA-C    Onset Date/Surgical Date  07/04/19    Hand Dominance  Right    Next MD Visit  09/14/2019      Prior Function   Level of Independence  Independent      AROM   AROM Assessment Site   Shoulder;Knee    Right/Left Shoulder  Right    Right Shoulder Flexion  155 Degrees   equal bilaterally   Right Shoulder ABduction  160 Degrees   equal bilaterally   Right Shoulder Internal Rotation  --   T7 - equal bilaterally   Right Shoulder External Rotation  --   T4 - equal bilaterally   Right/Left Knee  Right    Right Knee Extension  -1   lacking   Right Knee Flexion  135      Strength   Overall Strength Comments  Did not test right     Strength Assessment Site  Knee    Right/Left Knee  Right    Right Knee Flexion  4+/5    Right Knee Extension  4+/5      Palpation   Palpation comment  No TTP reported      Transfers   Transfers  Independent with all Transfers                   [facility] Adult PT Treatment/Exercise - 09/09/19 0001  Exercises   Exercises  Shoulder;Knee/Hip      Knee/Hip Exercises: Stretches   Passive Hamstring Stretch  3 reps;30 seconds    Passive Hamstring Stretch Limitations  seated in chair    Gastroc Stretch  3 reps;30 seconds    Gastroc Stretch Limitations  standing      Knee/Hip Exercises: Seated   Long Arc Quad  20 reps    Sit to [employer]  15 reps      Knee/Hip Exercises: Supine   Bridges  10 reps    Straight Leg Raises  15 reps      Knee/Hip Exercises: Sidelying   Hip ABduction  15 reps      Shoulder Exercises: Standing   External Rotation  Strengthening;15 reps   2 sets   Theraband Level (Shoulder External Rotation)  Level 2 (Red)    Internal Rotation  Strengthening;15 reps   2 sets   Theraband Level (Shoulder Internal Rotation)  Level 2 (Red)    ABduction  Strengthening;15 reps   2 sets   Shoulder ABduction Weight (lbs)  #3    ABduction Limitations  palm down    Extension  Strengthening;15 reps   2 sets   Theraband Level (Shoulder Extension)  Level 2 (Red)    Row  Strengthening;15 reps    Theraband Level (Shoulder Row)  Level 2 (Red)    Shoulder Elevation  Strengthening;15 reps   2 sets   Shoulder Elevation  Limitations  3# scaption with thumb up      Shoulder Exercises: ROM/Strengthening   UBE (Upper Arm Bike)  L1 x4 min (fwd/bwd)             PT Education - 09/09/19 0839    Education Details  HEP    Person(s) Educated  Patient    Methods  Explanation;Demonstration;Verbal cues;Handout;Tactile cues    Comprehension  Verbalized understanding;Returned demonstration;Verbal cues required;Tactile cues required;Need further instruction          PT Long Term Goals - 09/09/19 0921      PT LONG TERM GOAL #1   Title  Pt will demo full Rt GHJ AROM    Baseline  limited to shoulder height at eval    Time  6    Period  Weeks    Status  Achieved      PT LONG TERM GOAL #2   Title  pt will ambulate without antalgic pattern for community level distances    Baseline  slighty antalgic on Rt.    Time  6    Period  Weeks    Status  Revised    Target Date  10/21/19      PT LONG TERM GOAL #3   Title  pt will be able to lift and carry daily objects >10 lbs without limitation by shoulder    Baseline  avoiding use right now    Time  6    Period  Weeks    Status  Revised    Target Date  10/21/19      PT LONG TERM GOAL #4   Title  Patient will be I with final HEP to maintain progress made in PT    Time  6    Period  Weeks    Status  New    Target Date  10/21/19            Plan - 09/09/19 0910    Clinical Impression Statement  Patient is progressing well. He exhibits normalized UE range  of motion but continues to exhibit limited knee extension and reports occasional pain with weight bearing. He does continue to have a strength deficit of the right shoulder but is progressing with his resistance. He will be reduced to 1x/week as patient is doing well with HEP and continues to progress well. He would benefit from continued skilled PT to progress his strength and reduce pain so he can return to prior level of function.    PT Frequency  1x / week    PT Duration  6 weeks    PT  Treatment/Interventions  ADLs/Self Care Home Management;Cryotherapy;Electrical Stimulation;Moist Heat;Functional mobility training;Therapeutic activities;Therapeutic exercise;Patient/family education;Neuromuscular re-education;Manual techniques;Passive range of motion;Dry needling;Taping    PT Next Visit Plan  Assess and progress HEP PRN, continue right shoulder strengthening, knee extension motion and strengtheing of hips and right LE, gait training PRN    PT Home Exercise Plan  [email or web address]    Consulted and Agree with Plan of Care  Patient       Patient will benefit from skilled therapeutic intervention in order to improve the following deficits and impairments:  Abnormal gait, Decreased range of motion, Difficulty walking, Impaired UE functional use, Decreased activity tolerance, Pain, Improper body mechanics, Postural dysfunction  Visit Diagnosis: Acute pain of right shoulder  Pain in right leg  Muscle weakness (generalized)     Problem List Patient Active Problem List   Diagnosis Date Noted  . Plantar fasciitis 10/09/2018    Hilda Blades, PT, DPT, LAT, ATC 09/09/19  10:22 AM Phone: [phone] Fax: [facility] [address] [place], [place], [address] Phone: [phone]   Fax:  [phone]  Name: Logan Chambers MRN: [id number] Date of Birth: [birth year]

## 2019-09-09 NOTE — Patient Instructions (Signed)
Access Code: [email or web address]  URL: [email or web address]  Date: 09/09/2019  Prepared by: Hilda Blades   Exercises Seated Knee Extension AROM - 20 reps - 1x daily - 7x weekly Supine Active Straight Leg Raise - 30 reps - 2 sets - 1x daily - 7x weekly Sidelying Hip Abduction - 30 reps - 2 sets - 1x daily - 7x weekly Supine Bridge - 15 reps - 2 sets - 3s hold - 1x daily - 7x weekly Sit to Stand without Arm Support - 15 reps - 2 sets - 1x daily - 7x weekly Seated Hamstring Stretch - 2 reps - 20s hold - 1x daily - 7x weekly                  Gastroc Stretch on Wall - 2 reps - 20sec hold - 1x daily - 7x weekly Standing Bilateral Low Shoulder Row with Anchored Resistance - 20 reps - 2 sets - 1x daily - 7x weekly Shoulder Extension with Band - 20 reps - 2 sets - 1x daily - 7x weekly Shoulder External Rotation with Anchored Resistance - 10 reps - 3 sets - 1x daily - 7x weekly Standing Shoulder Internal Rotation with Anchored Resistance - 10 reps - 3 sets - 1x daily - 7x weekly Scaption with Dumbbells - 10 reps - 3 sets - 1x daily - 7x weekly Shoulder Abduction with Dumbbells - Palms Down - 10 reps - 3 sets - 1x daily - 7x weekly

## 2019-09-14 ENCOUNTER — Encounter: Payer: Self-pay | Admitting: Orthopaedic Surgery

## 2019-09-14 ENCOUNTER — Other Ambulatory Visit: Payer: Self-pay

## 2019-09-14 ENCOUNTER — Ambulatory Visit: Payer: Self-pay

## 2019-09-14 ENCOUNTER — Ambulatory Visit (INDEPENDENT_AMBULATORY_CARE_PROVIDER_SITE_OTHER): Payer: Medicare Other | Admitting: Orthopaedic Surgery

## 2019-09-14 VITALS — Ht 74.0 in | Wt 150.0 lb

## 2019-09-14 DIAGNOSIS — S42034K Nondisplaced fracture of lateral end of right clavicle, subsequent encounter for fracture with nonunion: Secondary | ICD-10-CM | POA: Diagnosis not present

## 2019-09-14 NOTE — Progress Notes (Signed)
   Office Visit Note   Patient: Logan Chambers           Date of Birth: [birth year]           MRN: [id number] Visit Date: 09/14/2019              Requested by: Velna Hatchet, MD [address] [place],  [place] [address] PCP: Velna Hatchet, MD   Assessment & Plan: Visit Diagnoses:  1. 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  Patient will advance with activity as tolerated.  He will follow up with [place] as needed.  Follow-Up Instructions: Return if symptoms worsen or fail to improve.   Orders:  Orders Placed This Encounter  Procedures  . XR Clavicle Right   No orders of the defined types were placed in this encounter.     Procedures: No procedures performed   Clinical Data: No additional findings.   Subjective: Chief Complaint  Patient presents with  . Right Shoulder - Follow-up    HPI patient is a pleasant 69 year old gentleman who comes in today 10 weeks out right distal clavicle fracture.  He has been doing well.  He has been in physical therapy where he has regained near full range of motion strength.  He denies any pain to the clavicle.  Overall doing well without complaints     Objective: Vital Signs: Ht 6\' 2"  (1.88 m)   Wt 150 lb (68 kg)   BMI 19.26 kg/m     Ortho Exam examination of the right clavicle reveals no tenderness.  He has full range of motion of the shoulder.  Full strength of the shoulder.  He is neurovascular intact distally.  Specialty Comments:  No specialty comments available.  Imaging: XR Clavicle Right  Result Date: 09/14/2019 X-rays demonstrate nondisplaced distal clavicle fracture with persistent fracture lucency.      PMFS History: Patient Active Problem List   Diagnosis Date Noted  . Plantar fasciitis 10/09/2018   Past Medical History:  Diagnosis Date  . Arm fracture, left   .  Arthritis   . Hypertension     History reviewed. No pertinent family history.  History reviewed. No pertinent surgical history. Social History   Occupational History  . Not on file  Tobacco Use  . Smoking status: Current Every Day Smoker    Types: Cigarettes  . Smokeless tobacco: Never Used  Substance and Sexual Activity  . Alcohol use: Yes    Comment: 2 40 oz per day   . Drug use: No  . Sexual activity: Not on file

## 2019-09-15 ENCOUNTER — Encounter: Payer: Self-pay | Admitting: Physical Therapy

## 2019-09-15 ENCOUNTER — Ambulatory Visit: Payer: Medicare Other | Admitting: Physical Therapy

## 2019-09-15 DIAGNOSIS — M25511 Pain in right shoulder: Secondary | ICD-10-CM

## 2019-09-15 DIAGNOSIS — M6281 Muscle weakness (generalized): Secondary | ICD-10-CM

## 2019-09-15 DIAGNOSIS — M79604 Pain in right leg: Secondary | ICD-10-CM

## 2019-09-15 DIAGNOSIS — M25512 Pain in left shoulder: Secondary | ICD-10-CM

## 2019-09-15 DIAGNOSIS — G8929 Other chronic pain: Secondary | ICD-10-CM

## 2019-09-15 DIAGNOSIS — M25612 Stiffness of left shoulder, not elsewhere classified: Secondary | ICD-10-CM

## 2019-09-15 NOTE — Therapy (Signed)
[place], [place], [address] Phone: [phone]   Fax:  [phone]  Physical Therapy Treatment/Discharge  Patient Details  Name: Logan Chambers MRN: [id number] Date of Birth: 09-Jun-1951 Referring Provider (PT): Aundra Dubin, [place]   Encounter Date: 09/15/2019  PT End of Session - 09/15/19 0846    Visit Number  7    Number of Visits  12    Date for PT Re-Evaluation  10/21/19    Authorization Type  [employer] MCR    PT Start Time  0840    PT Stop Time  0911    PT Time Calculation (min)  31 min    Activity Tolerance  Patient tolerated treatment well    Behavior During Therapy  [facility] for tasks assessed/performed       Past Medical History:  Diagnosis Date  . Arm fracture, left   . Arthritis   . Hypertension     History reviewed. No pertinent surgical history.  There were no vitals filed for this visit.  Subjective Assessment - 09/15/19 0844    Subjective  Shoulder is not bothering me and knee only hurts on really cold mornings. I am doing my exercises.    Patient Stated Goals  walk better, move shoulder more    Currently in Pain?  No/denies         [facility] PT Assessment - 09/15/19 0001      Assessment   Medical Diagnosis  Rt clavicle fracture, Rt proximal fibula fracture    Referring Provider (PT)  Aundra Dubin, PA-C    Onset Date/Surgical Date  07/04/19    Hand Dominance  Right      AROM   Overall AROM Comments  GHJ WFL and equal bilaterally    Right Knee Extension  0    Right Knee Flexion  135      Strength   Overall Strength Comments  GHJ strength gross 5/5    Right Knee Flexion  5/5    Right Knee Extension  5/5      Palpation   Palpation comment  denies TTP                   [facility] Adult PT Treatment/Exercise - 09/15/19 0001      Exercises   Exercises  Other Exercises    Other Exercises   reviewed HEP list             PT Education - 09/15/19 0914     Education Details  Importance of continued HEP, goals    Person(s) Educated  Patient    Methods  Explanation;Demonstration;Tactile cues;Verbal cues    Comprehension  Verbalized understanding;Returned demonstration          PT Long Term Goals - 09/15/19 0858      PT LONG TERM GOAL #1   Title  Pt will demo full Rt GHJ AROM    Status  Achieved      PT LONG TERM GOAL #2   Title  pt will ambulate without antalgic pattern for community level distances    Baseline  able since he knows to do exercises before getting out of bed    Status  Achieved      PT LONG TERM GOAL #3   Title  pt will be able to lift and carry daily objects >10 lbs without limitation by shoulder    Baseline  reports he is using his arm, feels stronger doing exercises  Status  Achieved      PT LONG TERM GOAL #4   Title  Patient will be I with final HEP to maintain progress made in PT    Status  Achieved            Plan - 09/15/19 0914    Clinical Impression Statement  [name] has done very well progressing with home HEP. At this time he reports he has met all of his goals and is functioning at a level that is appropriate for him. He says he will continue to do his exercises and was encouraged to contact us with any futher questions.    PT Treatment/Interventions  ADLs/Self Care Home Management;Cryotherapy;Electrical Stimulation;Moist Heat;Functional mobility training;Therapeutic activities;Therapeutic exercise;Patient/family education;Neuromuscular re-education;Manual techniques;Passive range of motion;Dry needling;Taping    PT Home Exercise Plan  [id number]    Consulted and Agree with Plan of Care  Patient       Patient will benefit from skilled therapeutic intervention in order to improve the following deficits and impairments:  Abnormal gait, Decreased range of motion, Difficulty walking, Impaired UE functional use, Decreased activity tolerance, Pain, Improper body mechanics, Postural  dysfunction  Visit Diagnosis: Acute pain of right shoulder  Pain in right leg  Muscle weakness (generalized)  Chronic left shoulder pain  Stiffness of left shoulder, not elsewhere classified     Problem List Patient Active Problem List   Diagnosis Date Noted  . Plantar fasciitis 10/09/2018    PHYSICAL THERAPY DISCHARGE SUMMARY  Visits from Start of Care: 7  Current functional level related to goals / functional outcomes: See above   Remaining deficits: See above   Education / Equipment: Anatomy of condition, POC, HEP, exercise form/rationale  Plan: Patient agrees to discharge.  Patient goals were met. Patient is being discharged due to meeting the stated rehab goals.  ?????     Azeneth Carbonell C. Vinessa Macconnell PT, DPT 09/15/19 9:17 AM   [facility] [address] [place], [place], [address] Phone: [phone]   Fax:  [phone]  Name: Logan Chambers MRN: [id number] Date of Birth: 07-21-1951

## 2019-09-17 ENCOUNTER — Encounter: Payer: Medicare Other | Admitting: Physical Therapy

## 2019-09-21 ENCOUNTER — Ambulatory Visit: Payer: Medicare Other | Admitting: Physical Therapy

## 2019-09-23 ENCOUNTER — Encounter: Payer: Medicare Other | Admitting: Physical Therapy
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
## 2020-06-12 DIAGNOSIS — Z860101 Personal history of adenomatous and serrated colon polyps: Secondary | ICD-10-CM

## 2020-06-12 DIAGNOSIS — Z86010 Personal history of colonic polyps: Secondary | ICD-10-CM

## 2020-06-12 HISTORY — DX: Personal history of adenomatous and serrated colon polyps: Z86.0101

## 2020-06-12 LAB — HM COLONOSCOPY

## 2020-12-06 ENCOUNTER — Other Ambulatory Visit: Payer: Self-pay | Admitting: Internal Medicine

## 2020-12-06 DIAGNOSIS — Z0000 Encounter for general adult medical examination without abnormal findings: Secondary | ICD-10-CM

## 2022-01-01 ENCOUNTER — Other Ambulatory Visit: Payer: Self-pay | Admitting: Internal Medicine

## 2022-01-01 DIAGNOSIS — Z0000 Encounter for general adult medical examination without abnormal findings: Secondary | ICD-10-CM

## 2022-02-16 ENCOUNTER — Emergency Department (HOSPITAL_COMMUNITY)
Admission: EM | Admit: 2022-02-16 | Discharge: 2022-02-16 | Disposition: A | Discharge status: Home / Self Care | Payer: Medicare Other | Attending: Emergency Medicine | Admitting: Emergency Medicine

## 2022-02-16 ENCOUNTER — Emergency Department (HOSPITAL_COMMUNITY): Payer: Medicare Other

## 2022-02-16 ENCOUNTER — Other Ambulatory Visit: Payer: Self-pay

## 2022-02-16 ENCOUNTER — Encounter (HOSPITAL_COMMUNITY): Payer: Self-pay

## 2022-02-16 DIAGNOSIS — S60042A Contusion of left ring finger without damage to nail, initial encounter: Secondary | ICD-10-CM | POA: Insufficient documentation

## 2022-02-16 DIAGNOSIS — S6992XA Unspecified injury of left wrist, hand and finger(s), initial encounter: Secondary | ICD-10-CM | POA: Diagnosis present

## 2022-02-16 DIAGNOSIS — W230XXA Caught, crushed, jammed, or pinched between moving objects, initial encounter: Secondary | ICD-10-CM | POA: Diagnosis not present

## 2022-02-16 NOTE — ED Notes (Signed)
Pt verbalizes understanding of discharge instructions. Opportunity for questions and answers were provided. Pt discharged from the ED.   ?

## 2022-02-27 ENCOUNTER — Emergency Department (HOSPITAL_COMMUNITY): Payer: Medicare Other

## 2022-02-27 ENCOUNTER — Inpatient Hospital Stay (HOSPITAL_COMMUNITY)
Admission: EM | Admit: 2022-02-27 | Discharge: 2022-03-07 | DRG: 439 | Disposition: A | Discharge status: Home / Self Care | Payer: Medicare Other | Attending: Internal Medicine | Admitting: Internal Medicine

## 2022-02-27 ENCOUNTER — Encounter (HOSPITAL_COMMUNITY): Payer: Self-pay | Admitting: Emergency Medicine

## 2022-02-27 DIAGNOSIS — K852 Alcohol induced acute pancreatitis without necrosis or infection: Secondary | ICD-10-CM | POA: Diagnosis not present

## 2022-02-27 DIAGNOSIS — S99921A Unspecified injury of right foot, initial encounter: Secondary | ICD-10-CM

## 2022-02-27 DIAGNOSIS — R188 Other ascites: Secondary | ICD-10-CM | POA: Diagnosis present

## 2022-02-27 DIAGNOSIS — Z789 Other specified health status: Secondary | ICD-10-CM | POA: Diagnosis present

## 2022-02-27 DIAGNOSIS — C800 Disseminated malignant neoplasm, unspecified: Secondary | ICD-10-CM

## 2022-02-27 DIAGNOSIS — R1084 Generalized abdominal pain: Secondary | ICD-10-CM

## 2022-02-27 DIAGNOSIS — F1090 Alcohol use, unspecified, uncomplicated: Secondary | ICD-10-CM | POA: Diagnosis present

## 2022-02-27 DIAGNOSIS — K5900 Constipation, unspecified: Secondary | ICD-10-CM

## 2022-02-27 DIAGNOSIS — F1010 Alcohol abuse, uncomplicated: Secondary | ICD-10-CM | POA: Diagnosis present

## 2022-02-27 DIAGNOSIS — C786 Secondary malignant neoplasm of retroperitoneum and peritoneum: Secondary | ICD-10-CM | POA: Diagnosis not present

## 2022-02-27 DIAGNOSIS — F17210 Nicotine dependence, cigarettes, uncomplicated: Secondary | ICD-10-CM | POA: Diagnosis present

## 2022-02-27 DIAGNOSIS — W230XXA Caught, crushed, jammed, or pinched between moving objects, initial encounter: Secondary | ICD-10-CM | POA: Diagnosis present

## 2022-02-27 DIAGNOSIS — K668 Other specified disorders of peritoneum: Secondary | ICD-10-CM

## 2022-02-27 DIAGNOSIS — S99929A Unspecified injury of unspecified foot, initial encounter: Secondary | ICD-10-CM | POA: Diagnosis present

## 2022-02-27 DIAGNOSIS — Z720 Tobacco use: Secondary | ICD-10-CM

## 2022-02-27 DIAGNOSIS — I96 Gangrene, not elsewhere classified: Secondary | ICD-10-CM | POA: Insufficient documentation

## 2022-02-27 DIAGNOSIS — S60427A Blister (nonthermal) of left little finger, initial encounter: Secondary | ICD-10-CM | POA: Diagnosis present

## 2022-02-27 DIAGNOSIS — D649 Anemia, unspecified: Secondary | ICD-10-CM

## 2022-02-27 DIAGNOSIS — E871 Hypo-osmolality and hyponatremia: Secondary | ICD-10-CM

## 2022-02-27 DIAGNOSIS — R109 Unspecified abdominal pain: Secondary | ICD-10-CM | POA: Diagnosis present

## 2022-02-27 DIAGNOSIS — I10 Essential (primary) hypertension: Secondary | ICD-10-CM | POA: Diagnosis present

## 2022-02-27 LAB — CBC
HCT: 36.1 % — ABNORMAL LOW (ref 39.0–52.0)
Hemoglobin: 12.6 g/dL — ABNORMAL LOW (ref 13.0–17.0)
MCH: 34.7 pg — ABNORMAL HIGH (ref 26.0–34.0)
MCHC: 34.9 g/dL (ref 30.0–36.0)
MCV: 99.4 fL (ref 80.0–100.0)
Platelets: 220 10*3/uL (ref 150–400)
RBC: 3.63 MIL/uL — ABNORMAL LOW (ref 4.22–5.81)
RDW: 11.9 % (ref 11.5–15.5)
WBC: 6 10*3/uL (ref 4.0–10.5)
nRBC: 0 % (ref 0.0–0.2)

## 2022-02-27 LAB — TROPONIN I (HIGH SENSITIVITY)
Troponin I (High Sensitivity): 10 ng/L (ref <18)
Troponin I (High Sensitivity): 10 ng/L (ref <18)

## 2022-02-27 LAB — COMPREHENSIVE METABOLIC PANEL
ALT: 14 U/L (ref 0–44)
AST: 26 U/L (ref 15–41)
Albumin: 2.6 g/dL — ABNORMAL LOW (ref 3.5–5.0)
Alkaline Phosphatase: 149 U/L — ABNORMAL HIGH (ref 38–126)
Anion gap: 14 (ref 5–15)
BUN: 6 mg/dL — ABNORMAL LOW (ref 8–23)
CO2: 22 mmol/L (ref 22–32)
Calcium: 8.3 mg/dL — ABNORMAL LOW (ref 8.9–10.3)
Chloride: 94 mmol/L — ABNORMAL LOW (ref 98–111)
Creatinine, Ser: 0.58 mg/dL — ABNORMAL LOW (ref 0.61–1.24)
GFR, Estimated: >60 mL/min (ref >60)
Glucose, Bld: 87 mg/dL (ref 70–99)
Potassium: 4.1 mmol/L (ref 3.5–5.1)
Sodium: 130 mmol/L — ABNORMAL LOW (ref 135–145)
Total Bilirubin: 0.9 mg/dL (ref 0.3–1.2)
Total Protein: 6.1 g/dL — ABNORMAL LOW (ref 6.5–8.1)

## 2022-02-27 LAB — LIPASE, BLOOD: Lipase: 451 U/L — ABNORMAL HIGH (ref 11–51)

## 2022-02-27 MED ORDER — FENTANYL CITRATE PF 50 MCG/ML IJ SOSY
50.0000 ug | PREFILLED_SYRINGE | Freq: Once | INTRAMUSCULAR | Status: AC
  Administered 2022-02-27: 50 ug via INTRAVENOUS
  Filled 2022-02-27: qty 1

## 2022-02-27 MED ORDER — MORPHINE SULFATE (PF) 2 MG/ML IV SOLN
2.0000 mg | INTRAVENOUS | Status: DC | PRN
  Administered 2022-02-27: 4 mg via INTRAVENOUS
  Administered 2022-03-01: 2 mg via INTRAVENOUS
  Filled 2022-02-27: qty 2
  Filled 2022-02-27: qty 1

## 2022-02-27 MED ORDER — IOHEXOL 300 MG/ML  SOLN
80.0000 mL | Freq: Once | INTRAMUSCULAR | Status: AC | PRN
  Administered 2022-02-27: 80 mL via INTRAVENOUS

## 2022-02-27 MED ORDER — LACTATED RINGERS IV SOLN
INTRAVENOUS | Status: DC
  Administered 2022-03-04: 900 mL via INTRAVENOUS

## 2022-02-27 MED ORDER — ONDANSETRON HCL 4 MG PO TABS: 4.0000 mg | ORAL_TABLET | Freq: Four times a day (QID) | ORAL | Status: DC | PRN

## 2022-02-27 MED ORDER — ONDANSETRON HCL 4 MG/2ML IJ SOLN
4.0000 mg | Freq: Four times a day (QID) | INTRAMUSCULAR | Status: DC | PRN
  Administered 2022-03-01: 4 mg via INTRAVENOUS
  Filled 2022-02-27: qty 2

## 2022-02-27 MED ORDER — ACETAMINOPHEN 325 MG PO TABS: 650.0000 mg | ORAL_TABLET | Freq: Four times a day (QID) | ORAL | Status: DC | PRN

## 2022-02-27 MED ORDER — ENOXAPARIN SODIUM 40 MG/0.4ML IJ SOSY
40.0000 mg | PREFILLED_SYRINGE | INTRAMUSCULAR | Status: DC
  Administered 2022-03-01 – 2022-03-07 (×6): 40 mg via SUBCUTANEOUS
  Filled 2022-02-27 (×9): qty 0.4

## 2022-02-27 MED ORDER — ACETAMINOPHEN 650 MG RE SUPP: 650.0000 mg | Freq: Four times a day (QID) | RECTAL | Status: DC | PRN

## 2022-02-27 NOTE — ED Triage Notes (Signed)
Patient complains of chest pain and shortness of breath that started two days ago. Patient report decreased appetite, intermittent diarrhea. Patient is alert, oriented, speaking in complete sentences, and is in no apparent distress at this time.

## 2022-02-27 NOTE — ED Provider Triage Note (Signed)
Emergency Medicine Provider Triage Evaluation Note  Logan Chambers , a 71 y.o. male  was evaluated in triage.  Pt complains of diffuse chest and abdominal pain that has been intermittent for the past 2 days.  Reports decreased appetite.  No fever or cough.  No leg swelling.  Has not been taking medicine help with the symptoms.  Review of Systems  Positive: Chest pain, abdominal pain Negative: Fever, cough, leg swelling  Physical Exam  BP (!) 150/95 (BP Location: Left Arm)   Pulse 95   Temp (!) 97.5 F (36.4 C) (Oral)   Resp 17   SpO2 100%  Gen:   Awake, no distress   Resp:  Normal effort  MSK:   Moves extremities without difficulty  Other:  Abdomen generally tender without rebound or guarding  Medical Decision Making  Medically screening exam initiated at 12:35 PM.  Appropriate orders placed.  Dwain Huhn was informed that the remainder of the evaluation will be completed by another provider, this initial triage assessment does not replace that evaluation, and the importance of remaining in the ED until their evaluation is complete.  Labs and EKG ordered   Delia Heady, PA-C 02/27/22 1236

## 2022-02-27 NOTE — Assessment & Plan Note (Addendum)
-   history of ~18 pack of beer daily now he reports 2 [place] daily - currently no s/s of withdrawal but at risk for this; denies tremors between drinks  -Has not developed any signs of withdrawal over the past approximate week

## 2022-02-27 NOTE — H&P (Signed)
History and Physical    Patient: Logan Chambers UVO:[id number] DOB: 09-08-1950 DOA: 02/27/2022 DOS: the patient was seen and examined on 02/27/2022 PCP: Velna Hatchet, MD  Patient coming from: Home  Chief Complaint:  Chief Complaint  Patient presents with   Chest Pain   HPI: Logan Chambers is a 70 y.o. male with medical history significant of HTN, arthritis.  Looks like prior regular EtOH use, and EtOH pancreatitis a couple of years ago.  Pt has since "cut back a-lot" on drinking.  Pt presents to ED with c/o CP and SOB, also has abd pain.  The chest pain is all over his chest primarily in the middle and it does not radiate elsewhere.  It mostly feels sharp, is worse when he coughs or when he pushes on the chest wall.  Also endorses abdominal pain "all over", this has been going on for some number of days as well.  The pain moves different quadrants, associated with some intermittent diarrhea but no vomiting or nausea.  Denies any blood in the stool.  Pt reports abd pain has been off and on for 2-3 years.  Patient states he also stubbed his right toe against a wall a few weeks ago and has been having pain since then.  He denies any fevers or chills at home.  Denies any history of diabetes.   Review of Systems: As mentioned in the history of present illness. All other systems reviewed and are negative. Past Medical History:  Diagnosis Date   Arm fracture, left    Arthritis    Hypertension    No past surgical history on file. Social History:  reports that he has been smoking cigarettes. He has never used smokeless tobacco. He reports current alcohol use. He reports that he does not use drugs.  No Known Allergies  No family history on file.  Prior to Admission medications   Medication Sig Start Date End Date Taking? Authorizing Provider  famotidine (PEPCID) 20 MG tablet Take 20 mg by mouth 2 (two) times daily as needed (For itching per patient and phamracy). 12/21/21  Yes  [provider]  ciclopirox (PENLAC) 8 % solution Apply topically at bedtime. Apply over nail and surrounding skin. Apply daily over previous coat. After seven (7) days, may remove with alcohol and continue cycle. Patient not taking: Reported on 02/27/2022 05/17/19   Trula Slade, DPM  methocarbamol (ROBAXIN) 500 MG tablet Take 2 tablets (1,000 mg total) by mouth at bedtime as needed for muscle spasms. Patient not taking: Reported on 02/27/2022 03/24/17   Fatima Blank, MD  ondansetron (ZOFRAN ODT) 4 MG disintegrating tablet Take 1 tablet (4 mg total) by mouth every 8 (eight) hours as needed for nausea or vomiting. Patient not taking: Reported on 02/27/2022 05/26/19   Carlisle Cater, PA-C  oxycodone (OXY-IR) 5 MG capsule Take 1 capsule (5 mg total) by mouth every 6 (six) hours as needed. Patient not taking: Reported on 02/27/2022 05/26/19   Carlisle Cater, PA-C  traMADol (ULTRAM) 50 MG tablet Take 1 tablet (50 mg total) by mouth every 6 (six) hours as needed. Patient not taking: Reported on 02/27/2022 07/09/19   Aundra Dubin, [place]    Physical Exam: Vitals:   02/27/22 1800 02/27/22 1920 02/27/22 1925 02/27/22 1930  BP: (!) 149/105 (!) 154/103  (!) 145/96  Pulse: 98 (!) 101  (!) 101  Resp: 16 16  (!) 26  Temp:   98.1 F (36.7 C)   TempSrc:   Oral  SpO2: 100% 100%  100%   Constitutional: NAD, calm, comfortable Eyes: PERRL, lids and conjunctivae normal ENMT: Mucous membranes are moist. Posterior pharynx clear of any exudate or lesions.Normal dentition.  Neck: normal, supple, no masses, no thyromegaly Respiratory: clear to auscultation bilaterally, no wheezing, no crackles. Normal respiratory effort. No accessory muscle use.  Cardiovascular: Regular rate and rhythm, no murmurs / rubs / gallops. No extremity edema. 2+ pedal pulses. No carotid bruits.  Abdomen: Diffuse TTP, no rebound nor guarding. Musculoskeletal: Tenderness R little toe with bruising. Skin:         Neurologic: CN 2-12 grossly intact. Sensation intact, DTR normal. Strength 5/5 in all 4.  Psychiatric: Normal judgment and insight. Alert and oriented x 3. Normal mood.   Data Reviewed:    CMP     Component Value Date/Time   NA 130 (L) 02/27/2022 1235   K 4.1 02/27/2022 1235   CL 94 (L) 02/27/2022 1235   CO2 22 02/27/2022 1235   GLUCOSE 87 02/27/2022 1235   BUN 6 (L) 02/27/2022 1235   CREATININE 0.58 (L) 02/27/2022 1235   CALCIUM 8.3 (L) 02/27/2022 1235   PROT 6.1 (L) 02/27/2022 1235   ALBUMIN 2.6 (L) 02/27/2022 1235   AST 26 02/27/2022 1235   ALT 14 02/27/2022 1235   ALKPHOS 149 (H) 02/27/2022 1235   BILITOT 0.9 02/27/2022 1235   GFRNONAA >60 02/27/2022 1235   GFRAA >60 05/26/2019 1220   CBC    Component Value Date/Time   WBC 6.0 02/27/2022 1235   RBC 3.63 (L) 02/27/2022 1235   HGB 12.6 (L) 02/27/2022 1235   HCT 36.1 (L) 02/27/2022 1235   PLT 220 02/27/2022 1235   MCV 99.4 02/27/2022 1235   MCH 34.7 (H) 02/27/2022 1235   MCHC 34.9 02/27/2022 1235   RDW 11.9 02/27/2022 1235   LYMPHSABS 1.5 02/09/2018 1723   MONOABS 0.3 02/09/2018 1723   EOSABS 0.2 02/09/2018 1723   BASOSABS 0.0 02/09/2018 1723   Trop neg x2  Lipase = 451  CT AP = IMPRESSION: 1. Moderate fluid throughout the abdomen and pelvis, which may be partially loculated. Areas of omental nodularity and suspected soft tissue mass in the right para colic gutter concerning for peritoneal carcinomatosis. Correlation with paracentesis and fluid analysis is recommended. 2. Ill-defined hypodense region within the uncinate process of the pancreas, favor pancreatic mass over focal pancreatitis. Please correlate with clinical presentation and laboratory analysis. 3. Trace right pleural effusion. 4.  Aortic Atherosclerosis (ICD10-I70.0).  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  DDx includes acute on  chronic pancreatitis given the elevated lipase.  Does look like he has history of acute EtOH pancreatitis a couple of years ago.  HPI also somewhat more suspicious for chronic pancreatitis with 2-3 year history of intermittent abd pain. IR paracentesis ordered for tomorrow for diagnotic sampling. If negative for malignant cells: Consider MRI pancreas Consider GI consult Consider biopsy of pancreatic mass like area. NPO for now IVF: LR at 75 Repeat CBC/CMP in AM Sending CA 19-9, CEA, AFP Morphine PRN pain if needed.  Toe injury No fracture on X ray.  Alcohol use History of regular EtOH use in past, has "cut way back" since then. 1) watch for S/Sx of withdrawal      Advance Care Planning:   Code Status: Full Code  Consults: None  Family Communication: No family in room  Severity of Illness: The appropriate patient status  for this patient is OBSERVATION. Observation status is judged to be reasonable and necessary in order to provide the required intensity of service to ensure the patient's safety. The patient's presenting symptoms, physical exam findings, and initial radiographic and laboratory data in the context of their medical condition is felt to place them at decreased risk for further clinical deterioration. Furthermore, it is anticipated that the patient will be medically stable for discharge from the hospital within 2 midnights of admission.   Author: Etta Quill., DO 02/27/2022 9:19 PM  For on call review [email or web address].

## 2022-02-27 NOTE — ED Notes (Signed)
Patient transported to CT 

## 2022-02-27 NOTE — Assessment & Plan Note (Addendum)
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 -He reports approximately 5 to 6 pound weight loss over the past 1 month with associated decreased appetite - lipase elevated but abdominal exam not consistently TTP and continues to tolerate diet -  AFP, CEA, CA 19-9 all negative/normal  - s/p paracentesis on 7/6; follow up cytology as well - the PMNs are 220, therefore will hold off on abx at this time

## 2022-02-27 NOTE — Assessment & Plan Note (Signed)
No fracture on X ray.

## 2022-02-27 NOTE — ED Provider Notes (Signed)
[facility] EMERGENCY DEPARTMENT Provider Note   CSN: [id number] Arrival date & time: 02/27/22  1213     History  Chief Complaint  Patient presents with   Chest Pain    Logan Chambers is a 71 y.o. male.   Chest Pain   Patient presents due to chest pain and shortness of breath.  Some going on for some time but worsened in the last few days.  The chest pain is all over his chest primarily in the middle and it does not radiate elsewhere.  It mostly feels sharp, is worse when he coughs or when he pushes on the chest wall.  Also endorses abdominal pain "all over", this has been going on for some number of days as well.  The pain moves different quadrants, associated with some intermittent diarrhea but no vomiting or nausea.  Denies any blood in the stool.  Patient states he also stubbed his right toe against a wall a few weeks ago and has been having pain since then.  He denies any fevers or chills at home.  Denies any history of diabetes.  Patient is a daily cigarette smoker.  Home Medications Prior to Admission medications   Medication Sig Start Date End Date Taking? Authorizing Provider  famotidine (PEPCID) 20 MG tablet Take 20 mg by mouth 2 (two) times daily as needed (For itching per patient and phamracy). 12/21/21  Yes [provider]  ciclopirox (PENLAC) 8 % solution Apply topically at bedtime. Apply over nail and surrounding skin. Apply daily over previous coat. After seven (7) days, may remove with alcohol and continue cycle. Patient not taking: Reported on 02/27/2022 05/17/19   Trula Slade, DPM  methocarbamol (ROBAXIN) 500 MG tablet Take 2 tablets (1,000 mg total) by mouth at bedtime as needed for muscle spasms. Patient not taking: Reported on 02/27/2022 03/24/17   Fatima Blank, MD  ondansetron (ZOFRAN ODT) 4 MG disintegrating tablet Take 1 tablet (4 mg total) by mouth every 8 (eight) hours as needed for nausea or vomiting. Patient not  taking: Reported on 02/27/2022 05/26/19   Carlisle Cater, PA-C  oxycodone (OXY-IR) 5 MG capsule Take 1 capsule (5 mg total) by mouth every 6 (six) hours as needed. Patient not taking: Reported on 02/27/2022 05/26/19   Carlisle Cater, PA-C  traMADol (ULTRAM) 50 MG tablet Take 1 tablet (50 mg total) by mouth every 6 (six) hours as needed. Patient not taking: Reported on 02/27/2022 07/09/19   Aundra Dubin, PA-C      Allergies    Patient has no known allergies.    Review of Systems   Review of Systems  Cardiovascular:  Positive for chest pain.    Physical Exam Updated Vital Signs BP (!) 145/96   Pulse (!) 101   Temp 98.1 F (36.7 C) (Oral)   Resp (!) 26   SpO2 100%  Physical Exam Vitals and nursing note reviewed. Exam conducted with a chaperone present.  Constitutional:      Appearance: Normal appearance.  HENT:     Head: Normocephalic and atraumatic.  Eyes:     General: No scleral icterus.       Right eye: No discharge.        Left eye: No discharge.     Extraocular Movements: Extraocular movements intact.     Pupils: Pupils are equal, round, and reactive to light.  Cardiovascular:     Rate and Rhythm: Normal rate and regular rhythm.     Pulses:  Normal pulses.     Heart sounds: Normal heart sounds. No murmur heard.    No friction rub. No gallop.  Pulmonary:     Effort: Pulmonary effort is normal. No respiratory distress.     Breath sounds: Normal breath sounds.  Abdominal:     General: Abdomen is flat. Bowel sounds are normal. There is no distension.     Palpations: Abdomen is soft.     Tenderness: There is abdominal tenderness.     Comments: Diffuse abdominal tenderness, no rigidity or guarding  Musculoskeletal:     Comments: Tenderness to right little toe  Skin:    General: Skin is warm and dry.     Capillary Refill: Capillary refill takes less than 2 seconds.     Coloration: Skin is not jaundiced.     Findings: Bruising present.  Neurological:     Mental Status: He  is alert. Mental status is at baseline.     Coordination: Coordination normal.          ED Results / Procedures / Treatments   Labs (all labs ordered are listed, but only abnormal results are displayed) Labs Reviewed  CBC - Abnormal; Notable for the following components:      Result Value   RBC 3.63 (*)    Hemoglobin 12.6 (*)    HCT 36.1 (*)    MCH 34.7 (*)    All other components within normal limits  COMPREHENSIVE METABOLIC PANEL - Abnormal; Notable for the following components:   Sodium 130 (*)    Chloride 94 (*)    BUN 6 (*)    Creatinine, Ser 0.58 (*)    Calcium 8.3 (*)    Total Protein 6.1 (*)    Albumin 2.6 (*)    Alkaline Phosphatase 149 (*)    All other components within normal limits  LIPASE, BLOOD - Abnormal; Notable for the following components:   Lipase 451 (*)    All other components within normal limits  CANCER ANTIGEN 19-9  CEA  AFP TUMOR MARKER  CBC  COMPREHENSIVE METABOLIC PANEL  TROPONIN I (HIGH SENSITIVITY)  TROPONIN I (HIGH SENSITIVITY)    EKG EKG Interpretation  Date/Time:  Wednesday February 27 2022 16:14:29 EDT Ventricular Rate:  94 PR Interval:  186 QRS Duration: 93 QT Interval:  339 QTC Calculation: 424 R Axis:   36 Text Interpretation: Sinus arrhythmia Consider anterior infarct No significant change since last tracing today Confirmed by Aletta Edouard [phone]) on 02/27/2022 4:16:45 PM  Radiology CT Abdomen Pelvis W Contrast  Result Date: 02/27/2022 CLINICAL DATA:  Chest pain, shortness of breath for 2 days, decreased appetite, intermittent diarrhea EXAM: CT ABDOMEN AND PELVIS WITH CONTRAST TECHNIQUE: Multidetector CT imaging of the abdomen and pelvis was performed using the standard protocol following bolus administration of intravenous contrast. RADIATION DOSE REDUCTION: This exam was performed according to the departmental dose-optimization program which includes automated exposure control, adjustment of the mA and/or kV according to  patient size and/or use of iterative reconstruction technique. CONTRAST:  35m OMNIPAQUE IOHEXOL 300 MG/ML  SOLN COMPARISON:  None Available. FINDINGS: Lower chest: Trace right pleural effusion. No acute airspace disease. Hepatobiliary: No focal liver abnormality is seen. No gallstones, gallbladder wall thickening, or biliary dilatation. Pancreas: Ill-defined hypodense masslike area within the uncinate process of the pancreas measures 2.1 x 2.4 cm reference image 36/3. While this could reflect focal pancreatitis, underlying pancreatic mass cannot be excluded. Please correlate with clinical presentation. Moderate atrophy of the pancreatic body and  tail. Spleen: Normal in size without focal abnormality. Adrenals/Urinary Tract: The kidneys enhance normally and symmetrically. The adrenals and bladder are unremarkable. Stomach/Bowel: No bowel obstruction or ileus. Decompressed appendix is seen within the right lower quadrant. No bowel wall thickening or inflammatory change. Vascular/Lymphatic: Diffuse atherosclerosis of the aorta and its branches. No pathologic adenopathy. Reproductive: Prostate is unremarkable. Other: There is moderate fluid throughout the abdomen and pelvis, which may be partially loculated. Dependently within the right pericolic gutter is a 5.3 x 3.4 by 7.3 cm hyperdense structure neck could reflect peritoneal mass. Subtle nodularity is seen within the omentum along the transverse mesocolon. Underlying peritoneal carcinomatosis cannot be excluded. Correlation with paracentesis and fluid analysis may be useful. No free intraperitoneal gas.  No abdominal wall hernia. Musculoskeletal: No acute or destructive bony lesions. Reconstructed images demonstrate no additional findings. IMPRESSION: 1. Moderate fluid throughout the abdomen and pelvis, which may be partially loculated. Areas of omental nodularity and suspected soft tissue mass in the right para colic gutter concerning for peritoneal carcinomatosis.  Correlation with paracentesis and fluid analysis is recommended. 2. Ill-defined hypodense region within the uncinate process of the pancreas, favor pancreatic mass over focal pancreatitis. Please correlate with clinical presentation and laboratory analysis. 3. Trace right pleural effusion. 4.  Aortic Atherosclerosis (ICD10-I70.0). Electronically Signed   By: Randa Ngo M.D.   On: 02/27/2022 20:25   DG Foot Complete Right  Result Date: 02/27/2022 CLINICAL DATA:  Pain, hip foot 2 weeks ago. EXAM: RIGHT FOOT COMPLETE - 3+ VIEW COMPARISON:  RIGHT foot evaluation from October 09, 2018. FINDINGS: Osteopenia. Soft tissues are unremarkable. No signs of fracture or of dislocation. Midfoot degenerative changes. IMPRESSION: Midfoot degenerative changes. No acute fracture or dislocation. Electronically Signed   By: Zetta Bills M.D.   On: 02/27/2022 17:04   DG Chest 2 View  Result Date: 02/27/2022 CLINICAL DATA:  Chest pain. EXAM: CHEST - 2 VIEW COMPARISON:  Chest x-ray October 10, 2009. FINDINGS: Streaky bibasilar opacities. No confluent consolidation. No visible pleural effusions or pneumothorax. Biapical pleuroparenchymal scarring. Cardiomediastinal silhouette is within normal limits. IMPRESSION: Streaky bibasilar opacities, which could represent atelectasis or atypical infection. Electronically Signed   By: Margaretha Sheffield M.D.   On: 02/27/2022 12:53    Procedures Procedures    Medications Ordered in ED Medications  enoxaparin (LOVENOX) injection 40 mg (has no administration in time range)  acetaminophen (TYLENOL) tablet 650 mg (has no administration in time range)    Or  acetaminophen (TYLENOL) suppository 650 mg (has no administration in time range)  ondansetron (ZOFRAN) tablet 4 mg (has no administration in time range)    Or  ondansetron (ZOFRAN) injection 4 mg (has no administration in time range)  lactated ringers infusion (has no administration in time range)  morphine (PF) 2 MG/ML  injection 2-4 mg (has no administration in time range)  fentaNYL (SUBLIMAZE) injection 50 mcg (50 mcg Intravenous Given 02/27/22 1753)  iohexol (OMNIPAQUE) 300 MG/ML solution 80 mL (80 mLs Intravenous Contrast Given 02/27/22 2013)    ED Course/ Medical Decision Making/ A&P Clinical Course as of 02/27/22 2103  Wed Feb 27, 2022  1627 ED EKG Patient is in sinus rhythm with slight PR prolongation, no significant change when compared to previous EKGs. [HS]  1629 DG Chest 2 View No significant cardiac enlargement but atypical opacities which could be consistent with underlying pneumonia [HS]  1629 CBC(!) No leukocytosis, mild anemia with a hemoglobin of 12.6 [HS]  1629 Comprehensive metabolic panel(!) Patient is slightly  hyponatremic but roughly baseline compared to chart review.  No gross electrolyte derangement or AKI, there is slight elevation of alk phos but AST and ALT all within normal limits.  Bicarb and potassium unremarkable [HS]  1657 Lipase, blood(!) Significantly elevated lipase which could be concerning for possible pancreatitis, will proceed with CT abdomen for better evaluation. [HS]  3790 He is here with a complaint of some abdominal pain and chest pain.  Its been going on a few weeks.  He had a history of abdominal pain for the last few years which he relates to his drinking but he is cut back some.  He also banged his right fifth toe a few weeks ago and its starting to become more painful.  No fevers or chills.  Mildly tender on exam for his abdomen.  Labs showing elevated lipase.  Will get imaging.  Possible admission [MB]  1827 Troponin I (High Sensitivity) Negative delta troponin [HS]  1922 DG Foot Complete Right No acute process, degenerative changes noted [HS]  2044 CT Abdomen Pelvis W Contrast CT scan is concerning for possible pancreatic mass and possible cancerous findings to the omentum.  I will consult hospitalist for admission and further diagnostic testing [HS]  2044 I  discussed the CT scan with the patient.  Concerned for peritoneal carcinomatosis. Discussed with patient this is likely cancer and could be metastatic in nature based on scan.  Patient verbalized understanding.  He is in agreement to stay for additional testing. [HS]    Clinical Course User Index [HS] Sherrill Raring, PA-C [MB] Hayden Rasmussen, MD                           Medical Decision Making Amount and/or Complexity of Data Reviewed Labs: ordered. Decision-making details documented in ED Course. Radiology: ordered. Decision-making details documented in ED Course. ECG/medicine tests:  Decision-making details documented in ED Course.  Risk Prescription drug management. Decision regarding hospitalization.   Patient presents with multiple complaints including toe pain, shortness of breath, abdominal pain, chest pain.  Differential is broad.  I ordered, viewed and interpreted laboratory work-up and imaging studies as documented above in the ED course.  Patient will need admission due to concerns of peritoneal carcinoma.  I spoke with the hospitalist Dr. Alcario Drought who agrees with admission.        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

## 2022-02-28 ENCOUNTER — Observation Stay (HOSPITAL_COMMUNITY): Payer: Medicare Other

## 2022-02-28 ENCOUNTER — Other Ambulatory Visit: Payer: Self-pay

## 2022-02-28 DIAGNOSIS — C786 Secondary malignant neoplasm of retroperitoneum and peritoneum: Secondary | ICD-10-CM | POA: Diagnosis present

## 2022-02-28 DIAGNOSIS — R1084 Generalized abdominal pain: Secondary | ICD-10-CM | POA: Diagnosis not present

## 2022-02-28 DIAGNOSIS — K852 Alcohol induced acute pancreatitis without necrosis or infection: Secondary | ICD-10-CM | POA: Diagnosis present

## 2022-02-28 DIAGNOSIS — I96 Gangrene, not elsewhere classified: Secondary | ICD-10-CM | POA: Diagnosis present

## 2022-02-28 DIAGNOSIS — E871 Hypo-osmolality and hyponatremia: Secondary | ICD-10-CM

## 2022-02-28 DIAGNOSIS — K8689 Other specified diseases of pancreas: Secondary | ICD-10-CM

## 2022-02-28 DIAGNOSIS — Z720 Tobacco use: Secondary | ICD-10-CM | POA: Diagnosis not present

## 2022-02-28 DIAGNOSIS — F1010 Alcohol abuse, uncomplicated: Secondary | ICD-10-CM | POA: Diagnosis present

## 2022-02-28 DIAGNOSIS — C800 Disseminated malignant neoplasm, unspecified: Secondary | ICD-10-CM

## 2022-02-28 DIAGNOSIS — S60427A Blister (nonthermal) of left little finger, initial encounter: Secondary | ICD-10-CM | POA: Diagnosis present

## 2022-02-28 DIAGNOSIS — I10 Essential (primary) hypertension: Secondary | ICD-10-CM | POA: Diagnosis present

## 2022-02-28 DIAGNOSIS — W230XXA Caught, crushed, jammed, or pinched between moving objects, initial encounter: Secondary | ICD-10-CM | POA: Diagnosis present

## 2022-02-28 DIAGNOSIS — K668 Other specified disorders of peritoneum: Secondary | ICD-10-CM | POA: Diagnosis not present

## 2022-02-28 DIAGNOSIS — K5900 Constipation, unspecified: Secondary | ICD-10-CM | POA: Diagnosis present

## 2022-02-28 DIAGNOSIS — R188 Other ascites: Secondary | ICD-10-CM | POA: Diagnosis present

## 2022-02-28 DIAGNOSIS — D649 Anemia, unspecified: Secondary | ICD-10-CM | POA: Diagnosis present

## 2022-02-28 DIAGNOSIS — F17210 Nicotine dependence, cigarettes, uncomplicated: Secondary | ICD-10-CM | POA: Diagnosis present

## 2022-02-28 HISTORY — PX: IR PARACENTESIS: IMG2679

## 2022-02-28 LAB — COMPREHENSIVE METABOLIC PANEL
ALT: 14 U/L (ref 0–44)
AST: 24 U/L (ref 15–41)
Albumin: 2.5 g/dL — ABNORMAL LOW (ref 3.5–5.0)
Alkaline Phosphatase: 128 U/L — ABNORMAL HIGH (ref 38–126)
Anion gap: 11 (ref 5–15)
BUN: 7 mg/dL — ABNORMAL LOW (ref 8–23)
CO2: 23 mmol/L (ref 22–32)
Calcium: 8.2 mg/dL — ABNORMAL LOW (ref 8.9–10.3)
Chloride: 96 mmol/L — ABNORMAL LOW (ref 98–111)
Creatinine, Ser: 0.6 mg/dL — ABNORMAL LOW (ref 0.61–1.24)
GFR, Estimated: >60 mL/min (ref >60)
Glucose, Bld: 113 mg/dL — ABNORMAL HIGH (ref 70–99)
Potassium: 4.5 mmol/L (ref 3.5–5.1)
Sodium: 130 mmol/L — ABNORMAL LOW (ref 135–145)
Total Bilirubin: 0.8 mg/dL (ref 0.3–1.2)
Total Protein: 5.8 g/dL — ABNORMAL LOW (ref 6.5–8.1)

## 2022-02-28 LAB — BODY FLUID CELL COUNT WITH DIFFERENTIAL
Eos, Fluid: 2 %
Lymphs, Fluid: 14 %
Monocyte-Macrophage-Serous Fluid: 38 % — ABNORMAL LOW (ref 50–90)
Neutrophil Count, Fluid: 46 % — ABNORMAL HIGH (ref 0–25)
Total Nucleated Cell Count, Fluid: 480 cu mm (ref 0–1000)

## 2022-02-28 LAB — CBC
HCT: 34.8 % — ABNORMAL LOW (ref 39.0–52.0)
Hemoglobin: 12.3 g/dL — ABNORMAL LOW (ref 13.0–17.0)
MCH: 34.9 pg — ABNORMAL HIGH (ref 26.0–34.0)
MCHC: 35.3 g/dL (ref 30.0–36.0)
MCV: 98.9 fL (ref 80.0–100.0)
Platelets: 192 10*3/uL (ref 150–400)
RBC: 3.52 MIL/uL — ABNORMAL LOW (ref 4.22–5.81)
RDW: 11.9 % (ref 11.5–15.5)
WBC: 6.8 10*3/uL (ref 4.0–10.5)
nRBC: 0 % (ref 0.0–0.2)

## 2022-02-28 LAB — GRAM STAIN

## 2022-02-28 LAB — FOLATE: Folate: 17.3 ng/mL (ref >5.9)

## 2022-02-28 LAB — PROTEIN, PLEURAL OR PERITONEAL FLUID: Total protein, fluid: 3.5 g/dL

## 2022-02-28 LAB — VITAMIN B12: Vitamin B-12: 625 pg/mL (ref 180–914)

## 2022-02-28 LAB — LACTATE DEHYDROGENASE, PLEURAL OR PERITONEAL FLUID: LD, Fluid: 132 U/L — ABNORMAL HIGH (ref 3–23)

## 2022-02-28 MED ORDER — LIDOCAINE HCL 1 % IJ SOLN
INTRAMUSCULAR | Status: AC
  Filled 2022-02-28: qty 20

## 2022-02-28 MED ORDER — NICOTINE 21 MG/24HR TD PT24
21.0000 mg | MEDICATED_PATCH | Freq: Every day | TRANSDERMAL | Status: DC
  Administered 2022-02-28 – 2022-03-07 (×8): 21 mg via TRANSDERMAL
  Filled 2022-02-28 (×8): qty 1

## 2022-02-28 MED ORDER — ALUM & MAG HYDROXIDE-SIMETH 200-200-20 MG/5ML PO SUSP
30.0000 mL | ORAL | Status: DC | PRN
Start: 2022-02-28 — End: 2022-03-07

## 2022-02-28 MED ORDER — PANTOPRAZOLE SODIUM 40 MG IV SOLR
40.0000 mg | INTRAVENOUS | Status: DC
  Administered 2022-03-01 – 2022-03-07 (×7): 40 mg via INTRAVENOUS
  Filled 2022-02-28 (×8): qty 10

## 2022-02-28 NOTE — Hospital Course (Signed)
[name] is a 71 yo male with PMH ongoing alcohol abuse, HTN, arthritis who presented with epigastric / lower chest pain and some intermittent abdominal pain.  He also reported that the abdominal pain has been present intermittently for the past 2 to 3 years. Notable labs on admission include elevated lipase, 451. CT abdomen/pelvis showed abdominal ascites partly loculated.  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  There was also a mention of ill-defined hypodense region involving the uncinate process of the pancreas raising concern for a pancreatic mass versus focal pancreatitis. He was admitted and underwent paracentesis for diagnostic and therapeutic purposes.  1 L of serosanguineous fluid was removed and sent for further testing.  GI was also consulted on admission for further guidance.

## 2022-02-28 NOTE — Procedures (Signed)
PROCEDURE SUMMARY:  Successful US guided paracentesis from RLQ.  Yielded 1 L of serosanguinous fluid.  No immediate complications.  Pt tolerated well.   Specimen was sent for labs.  EBL < 56m  KAscencion DikePA-C 02/28/2022 10:48 AM

## 2022-02-28 NOTE — Assessment & Plan Note (Addendum)
-   Mild.  Etiology suspected in setting of underlying alcohol abuse -Continue LR - BMP in a.m.

## 2022-02-28 NOTE — ED Notes (Signed)
Patient denies pain and is resting comfortably.  

## 2022-02-28 NOTE — ED Notes (Signed)
Pt transported to IR 

## 2022-02-28 NOTE — Assessment & Plan Note (Addendum)
-   MCV is borderline macrocytic.  Folate and B12 are normal

## 2022-02-28 NOTE — ED Notes (Signed)
ED TO INPATIENT HANDOFF REPORT  S Name/Age/Gender Logan Chambers 71 y.o. male Room/Bed: 043C/043C  Code Status   Code Status: Full Code  Home/SNF/Other Home Patient oriented to: self, place, time, and situation Is this baseline? 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 Patient report decreased appetite, intermittent diarrhea. Patient is alert, oriented, speaking in complete sentences, and is in no apparent distress at this time.   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]  Admitting Physician: Etta Quill [phone]  Attending Physician: Etta Quill [id number]          B Medical/Surgery History Past Medical History:  Diagnosis Date   Arm fracture, left    Arthritis    Hypertension    Past Surgical History:  Procedure Laterality Date   IR PARACENTESIS  02/28/2022     A IV Location/Drains/Wounds Patient Lines/Drains/Airways Status     Active Line/Drains/Airways     Name Placement date Placement time Site Days   Peripheral IV 02/27/22 20 G Right Antecubital 02/27/22  1625  Antecubital  1            Intake/Output Last 24 hours No intake or output data in the 24 hours ending 02/28/22 1338  Labs/Imaging Results for orders placed or performed during the hospital encounter of 02/27/22 (from the past 48 hour(s))  CBC     Status: Abnormal   Collection Time: 02/27/22 12:35 PM   Result Value Ref Range   WBC 6.0 4.0 - 10.5 K/uL   RBC 3.63 (L) 4.22 - 5.81 MIL/uL   Hemoglobin 12.6 (L) 13.0 - 17.0 g/dL   HCT 36.1 (L) 39.0 - 52.0 %   MCV 99.4 80.0 - 100.0 fL   MCH 34.7 (H) 26.0 - 34.0 pg   MCHC 34.9 30.0 - 36.0 g/dL   RDW 11.9 11.5 - 15.5 %   Platelets 220 150 - 400 K/uL   nRBC 0.0 0.0 - 0.2 %    Comment: Performed at [facility], [address] [place], [place] [address]  Troponin I (High Sensitivity)     Status: None   Collection Time: 02/27/22 12:35 PM  Result Value Ref Range   Troponin I (High Sensitivity) 10 <18 ng/L    Comment: (NOTE) Elevated high sensitivity troponin I (hsTnI) values and significant  changes across serial measurements may suggest ACS but many other  chronic and acute conditions are known to elevate hsTnI results.  Refer to the "Links" section for chest pain algorithms and additional  guidance. Performed at [facility], [place] [address], [place], [place] [address]   Comprehensive metabolic panel     Status: Abnormal   Collection Time: 02/27/22 12:35 PM  Result Value Ref Range   Sodium 130 (L) 135 - 145 mmol/L   Potassium 4.1 3.5 - 5.1 mmol/L   Chloride 94 (L) 98 - 111 mmol/L   CO2 22 22 - 32 mmol/L  Glucose, Bld 87 70 - 99 mg/dL    Comment: Glucose reference range applies only to samples taken after fasting for at least 8 hours.   BUN 6 (L) 8 - 23 mg/dL   Creatinine, Ser 0.58 (L) 0.61 - 1.24 mg/dL   Calcium 8.3 (L) 8.9 - 10.3 mg/dL   Total Protein 6.1 (L) 6.5 - 8.1 g/dL   Albumin 2.6 (L) 3.5 - 5.0 g/dL   AST 26 15 - 41 U/L   ALT 14 0 - 44 U/L   Alkaline Phosphatase 149 (H) 38 - 126 U/L   Total Bilirubin 0.9 0.3 - 1.2 mg/dL   GFR, Estimated >60 >60 mL/min    Comment: (NOTE) Calculated using the CKD-EPI Creatinine Equation (2021)    Anion gap 14 5 - 15    Comment: Performed at [facility], [place] [address] [place], [place] [address]  Lipase, blood     Status: Abnormal   Collection Time: 02/27/22 12:35  PM  Result Value Ref Range   Lipase 451 (H) 11 - 51 U/L    Comment: RESULTS CONFIRMED BY MANUAL DILUTION Performed at [facility], [place] [address], [place], [place] [address]   Troponin I (High Sensitivity)     Status: None   Collection Time: 02/27/22  4:25 PM  Result Value Ref Range   Troponin I (High Sensitivity) 10 <18 ng/L    Comment: (NOTE) Elevated high sensitivity troponin I (hsTnI) values and significant  changes across serial measurements may suggest ACS but many other  chronic and acute conditions are known to elevate hsTnI results.  Refer to the "Links" section for chest pain algorithms and additional  guidance. Performed at [facility], [place] [address], [place], [place] [address]   CBC     Status: Abnormal   Collection Time: 02/28/22  4:32 AM  Result Value Ref Range   WBC 6.8 4.0 - 10.5 K/uL   RBC 3.52 (L) 4.22 - 5.81 MIL/uL   Hemoglobin 12.3 (L) 13.0 - 17.0 g/dL   HCT 34.8 (L) 39.0 - 52.0 %   MCV 98.9 80.0 - 100.0 fL   MCH 34.9 (H) 26.0 - 34.0 pg   MCHC 35.3 30.0 - 36.0 g/dL   RDW 11.9 11.5 - 15.5 %   Platelets 192 150 - 400 K/uL   nRBC 0.0 0.0 - 0.2 %    Comment: Performed at [facility], [place] [address] [place], [place] [address]  Comprehensive metabolic panel     Status: Abnormal   Collection Time: 02/28/22  4:32 AM  Result Value Ref Range   Sodium 130 (L) 135 - 145 mmol/L   Potassium 4.5 3.5 - 5.1 mmol/L   Chloride 96 (L) 98 - 111 mmol/L   CO2 23 22 - 32 mmol/L   Glucose, Bld 113 (H) 70 - 99 mg/dL    Comment: Glucose reference range applies only to samples taken after fasting for at least 8 hours.   BUN 7 (L) 8 - 23 mg/dL   Creatinine, Ser 0.60 (L) 0.61 - 1.24 mg/dL   Calcium 8.2 (L) 8.9 - 10.3 mg/dL   Total Protein 5.8 (L) 6.5 - 8.1 g/dL   Albumin 2.5 (L) 3.5 - 5.0 g/dL   AST 24 15 - 41 U/L   ALT 14 0 - 44 U/L   Alkaline Phosphatase 128 (H) 38 - 126 U/L   Total Bilirubin 0.8 0.3 - 1.2 mg/dL   GFR, Estimated >60 >60  mL/min     Comment: (NOTE) Calculated using the CKD-EPI Creatinine Equation (2021)    Anion gap 11 5 - 15    Comment: Performed at [facility], [place] [address] [place], [place] [address]  Lactate dehydrogenase (pleural or peritoneal fluid)     Status: Abnormal   Collection Time: 02/28/22  9:41 AM  Result Value Ref Range   LD, Fluid 132 (H) 3 - 23 U/L    Comment: (NOTE) Results should be evaluated in conjunction with serum values    Fluid Type-FLDH ABDOMEN     Comment: Performed at [facility], [place] [address] [place], [place] [address]  Body fluid cell count with differential     Status: Abnormal   Collection Time: 02/28/22  9:41 AM  Result Value Ref Range   Fluid Type-FCT ABDOMEN    Color, Fluid RED (A) YELLOW   Appearance, Fluid CLOUDY (A) CLEAR   Total Nucleated Cell Count, Fluid 480 0 - 1,000 cu mm   Neutrophil Count, Fluid 46 (H) 0 - 25 %   Lymphs, Fluid 14 %   Monocyte-Macrophage-Serous Fluid 38 (L) 50 - 90 %   Eos, Fluid 2 %   Other Cells, Fluid OTHER CELLS IDENTIFIED AS MESOTHELIAL CELLS %    Comment: Performed at [facility], [place] [address] [place], [place] [address]  Protein, pleural or peritoneal fluid     Status: None   Collection Time: 02/28/22  9:41 AM  Result Value Ref Range   Total protein, fluid 3.5 g/dL    Comment: (NOTE) No normal range established for this test Results should be evaluated in conjunction with serum values    Fluid Type-FTP ABDOMEN     Comment: Performed at [place] [address] [place], [place] [address]   IR Paracentesis  Result Date: 02/28/2022 INDICATION: New onset ascites. Request for diagnostic and therapeutic paracentesis. EXAM: ULTRASOUND GUIDED RIGHT LOWER QUADRANT PARACENTESIS MEDICATIONS: 1% plain lidocaine, 3 mL COMPLICATIONS: None immediate. PROCEDURE: Informed written consent was obtained from the patient after a discussion of the risks, benefits and alternatives to treatment. A timeout was performed  prior to the initiation of the procedure. Initial ultrasound scanning demonstrates a large amount of ascites within the right lower abdominal quadrant. The right lower abdomen was prepped and draped in the usual sterile fashion. 1% lidocaine was used for local anesthesia. Following this, a 19 gauge, 7-cm, Yueh catheter was introduced. An ultrasound image was saved for documentation purposes. The paracentesis was performed. The catheter was removed and a dressing was applied. The patient tolerated the procedure well without immediate post procedural complication. FINDINGS: A total of approximately 1 L of serosanguineous fluid was removed. Samples were sent to the laboratory as requested by the clinical team. IMPRESSION: Successful ultrasound-guided paracentesis yielding 1 liters of peritoneal fluid. Read by: Ascencion Dike PA-C Electronically Signed   By: Miachel Roux M.D.   On: 02/28/2022 10:51   CT Abdomen Pelvis W Contrast  Result Date: 02/27/2022 CLINICAL DATA:  Chest pain, shortness of breath for 2 days, decreased appetite, intermittent diarrhea EXAM: CT ABDOMEN AND PELVIS WITH CONTRAST TECHNIQUE: Multidetector CT imaging of the abdomen and pelvis was performed using the standard protocol following bolus administration of intravenous contrast. RADIATION DOSE REDUCTION: This exam was performed according to the departmental dose-optimization program which includes automated exposure control, adjustment of the mA and/or kV according to patient size and/or use of iterative reconstruction technique. CONTRAST:  63m OMNIPAQUE  IOHEXOL 300 MG/ML  SOLN COMPARISON:  None Available. FINDINGS: Lower chest: Trace right pleural effusion. No acute airspace disease. Hepatobiliary: No focal liver abnormality is seen. No gallstones, gallbladder wall thickening, or biliary dilatation. Pancreas: Ill-defined hypodense masslike area within the uncinate process of the pancreas measures 2.1 x 2.4 cm reference image 36/3. While this  could reflect focal pancreatitis, underlying pancreatic mass cannot be excluded. Please correlate with clinical presentation. Moderate atrophy of the pancreatic body and tail. Spleen: Normal in size without focal abnormality. Adrenals/Urinary Tract: The kidneys enhance normally and symmetrically. The adrenals and bladder are unremarkable. Stomach/Bowel: No bowel obstruction or ileus. Decompressed appendix is seen within the right lower quadrant. No bowel wall thickening or inflammatory change. Vascular/Lymphatic: Diffuse atherosclerosis of the aorta and its branches. No pathologic adenopathy. Reproductive: Prostate is unremarkable. Other: There is moderate fluid throughout the abdomen and pelvis, which may be partially loculated. Dependently within the right pericolic gutter is a 5.3 x 3.4 by 7.3 cm hyperdense structure neck could reflect peritoneal mass. Subtle nodularity is seen within the omentum along the transverse mesocolon. Underlying peritoneal carcinomatosis cannot be excluded. Correlation with paracentesis and fluid analysis may be useful. No free intraperitoneal gas.  No abdominal wall hernia. Musculoskeletal: No acute or destructive bony lesions. Reconstructed images demonstrate no additional findings. IMPRESSION: 1. Moderate fluid throughout the abdomen and pelvis, which may be partially loculated. Areas of omental nodularity and suspected soft tissue mass in the right para colic gutter concerning for peritoneal carcinomatosis. Correlation with paracentesis and fluid analysis is recommended. 2. Ill-defined hypodense region within the uncinate process of the pancreas, favor pancreatic mass over focal pancreatitis. Please correlate with clinical presentation and laboratory analysis. 3. Trace right pleural effusion. 4.  Aortic Atherosclerosis (ICD10-I70.0). Electronically Signed   By: Randa Ngo M.D.   On: 02/27/2022 20:25   DG Foot Complete Right  Result Date: 02/27/2022 CLINICAL DATA:  Pain, hip  foot 2 weeks ago. EXAM: RIGHT FOOT COMPLETE - 3+ VIEW COMPARISON:  RIGHT foot evaluation from October 09, 2018. FINDINGS: Osteopenia. Soft tissues are unremarkable. No signs of fracture or of dislocation. Midfoot degenerative changes. IMPRESSION: Midfoot degenerative changes. No acute fracture or dislocation. Electronically Signed   By: Zetta Bills M.D.   On: 02/27/2022 17:04   DG Chest 2 View  Result Date: 02/27/2022 CLINICAL DATA:  Chest pain. EXAM: CHEST - 2 VIEW COMPARISON:  Chest x-ray October 10, 2009. FINDINGS: Streaky bibasilar opacities. No confluent consolidation. No visible pleural effusions or pneumothorax. Biapical pleuroparenchymal scarring. Cardiomediastinal silhouette is within normal limits. IMPRESSION: Streaky bibasilar opacities, which could represent atelectasis or atypical infection. Electronically Signed   By: Margaretha Sheffield M.D.   On: 02/27/2022 12:53    Pending Labs Unresulted Labs (From admission, onward)     Start     Ordered   02/28/22 0944  Aerobic/Anaerobic Culture w Gram Stain (surgical/deep wound)  RELEASE UPON ORDERING,   STAT        02/28/22 0944   02/28/22 0944  Miscellaneous [facility] test (send-out)  RELEASE UPON ORDERING,   STAT        02/28/22 0944   02/28/22 0944  Acid Fast Culture with reflexed sensitivities  RELEASE UPON ORDERING,   STAT        02/28/22 0944   02/28/22 0944  Acid Fast Smear (AFB)  RELEASE UPON ORDERING,   STAT        02/28/22 0944   02/28/22 0944  Body fluid culture w Gram Stain  RELEASE UPON ORDERING,   STAT        02/28/22 0944   02/28/22 0941  Culture, body fluid w Gram Stain-bottle  Once,   R        02/28/22 0941   02/28/22 0941  Gram stain  Once,   R        02/28/22 0941   02/27/22 2050  Cancer antigen 19-9  Once,   R        02/27/22 2050   02/27/22 2050  CEA  Once,   R        02/27/22 2050   02/27/22 2050  AFP tumor marker  Once,   R        02/27/22 2050            Vitals/Pain Today's Vitals   02/28/22 1230  02/28/22 1245 02/28/22 1300 02/28/22 1315  BP: (!) 138/92 135/86 (!) 146/82 (!) 146/94  Pulse: 87 96 95 96  Resp: (!) '21 18 18 14  '$ Temp:      TempSrc:      SpO2: 100% 99% 99% 99%  PainSc:        Isolation Precautions No active isolations  Medications Medications  enoxaparin (LOVENOX) injection 40 mg (has no administration in time range)  acetaminophen (TYLENOL) tablet 650 mg (has no administration in time range)    Or  acetaminophen (TYLENOL) suppository 650 mg (has no administration in time range)  ondansetron (ZOFRAN) tablet 4 mg (has no administration in time range)    Or  ondansetron (ZOFRAN) injection 4 mg (has no administration in time range)  lactated ringers infusion ( Intravenous New Bag/Given 02/27/22 2136)  morphine (PF) 2 MG/ML injection 2-4 mg (4 mg Intravenous Given 02/27/22 2141)  nicotine (NICODERM CQ - dosed in mg/24 hours) patch 21 mg (21 mg Transdermal Patch Applied 02/28/22 1104)  fentaNYL (SUBLIMAZE) injection 50 mcg (50 mcg Intravenous Given 02/27/22 1753)  iohexol (OMNIPAQUE) 300 MG/ML solution 80 mL (80 mLs Intravenous Contrast Given 02/27/22 2013)  lidocaine (XYLOCAINE) 1 % (with pres) injection (  Given by Other 02/28/22 0900)    Mobility walks High fall risk   Focused Assessments Cardiac Assessment Handoff:  Cardiac Rhythm: Sinus tachycardia Lab Results  Component Value Date   TROPONINI <0.03 02/09/2018   No results found for: "DDIMER" Does the Patient currently have chest pain? No   , Neuro Assessment Handoff:  Swallow screen pass? Yes  Cardiac Rhythm: Sinus tachycardia       Neuro Assessment:   Neuro Checks:      Last Documented NIHSS Modified Score:   Has TPA been given? No If patient is a Neuro Trauma and patient is going to OR before floor call report to [facility] nurse: [phone] or [phone]  , Pulmonary Assessment Handoff:  Lung sounds:   O2 Device: Room Air      R Recommendations: See Admitting Provider Note

## 2022-02-28 NOTE — Consult Note (Signed)
Consultation  Referring Provider:  Dr. Sabino Gasser    Primary Care Physician:  Velna Hatchet, MD Primary Gastroenterologist:  Althia Forts       Reason for Consultation:   Atypical Chest pain, Elevated Lipase           HPI:   Logan Chambers is a 71 y.o. male with a past medical history as listed below including hypertension and arthritis, who presented to the hospital 02/27/2022 with a complaint of chest pain.    At time of admission it was noted that patient look like he had prior regular alcohol use and alcoholic pancreatitis a couple of years ago but had been "cutting back a lot" on drinking.  He presented to the ED with chest pain and shortness of breath as well as some abdominal pain.    Today, patient explains that he has had the same abdominal pain which is very much generalized and sometimes worse toward the top of his abdomen over the past 2-1/2 years.  Tells me initially when it started he was drinking at least 18 beers a day and so when it recurred again he decided to cut back.  Currently now drinking 2 40 ounce beers a day.  Tells me he had really been doing okay with the pain over the past year or so since cutting back, but then about 2 weeks ago all of a sudden he started again with the pain which he describes as more of a cramp which seems to come and go.  At its worst an 8/10.  Tells me his pain seems to be worse when he moves around or tries to change positions.  Along with this has noticed a decrease in his appetite and a feeling of early satiety and fullness over the past month or so.  Tells me he is lost about 5 to 10 pounds over that period of time.  Associated symptoms include nausea with no vomiting.    Denies fever, chills, change in bowel habits or blood in his stool.  ED course: CT AP moderate fluid throughout the abdomen fluids which may be partially loculated, areas of omental nodularity and suspected soft tissue mass in the right paracolic gutter concerning for peritoneal  carcinomatosis, ill-defined hypodense region within the and cannot process of the pancreas favoring pancreatic mass over focal pancreatitis, trace right pleural effusion and aortic atherosclerosis, lipase 451, CMP with an alk phos of 149 and low sodium at 130  Past Medical History:  Diagnosis Date   Arm fracture, left    Arthritis    Hypertension     Past Surgical History:  Procedure Laterality Date   IR PARACENTESIS  02/28/2022    Family History: No GI cancers  Social History   Tobacco Use   Smoking status: Every Day    Types: Cigarettes   Smokeless tobacco: Never  Substance Use Topics   Alcohol use: Yes    Comment: 2 40 oz per day    Drug use: No    Prior to Admission medications   Medication Sig Start Date End Date Taking? Authorizing Provider  famotidine (PEPCID) 20 MG tablet Take 20 mg by mouth 2 (two) times daily as needed (For itching per patient and phamracy). 12/21/21  Yes [provider]  ciclopirox (PENLAC) 8 % solution Apply topically at bedtime. Apply over nail and surrounding skin. Apply daily over previous coat. After seven (7) days, may remove with alcohol and continue cycle. Patient not taking: Reported on 02/27/2022 05/17/19  Trula Slade, DPM  methocarbamol (ROBAXIN) 500 MG tablet Take 2 tablets (1,000 mg total) by mouth at bedtime as needed for muscle spasms. Patient not taking: Reported on 02/27/2022 03/24/17   Fatima Blank, MD  ondansetron (ZOFRAN ODT) 4 MG disintegrating tablet Take 1 tablet (4 mg total) by mouth every 8 (eight) hours as needed for nausea or vomiting. Patient not taking: Reported on 02/27/2022 05/26/19   Carlisle Cater, PA-C  oxycodone (OXY-IR) 5 MG capsule Take 1 capsule (5 mg total) by mouth every 6 (six) hours as needed. Patient not taking: Reported on 02/27/2022 05/26/19   Carlisle Cater, PA-C  traMADol (ULTRAM) 50 MG tablet Take 1 tablet (50 mg total) by mouth every 6 (six) hours as needed. Patient not taking: Reported  on 02/27/2022 07/09/19   Aundra Dubin, PA-C    Current Facility-Administered Medications  Medication Dose Route Frequency Provider Last Rate Last Admin   acetaminophen (TYLENOL) tablet 650 mg  650 mg Oral Q6H PRN Etta Quill, DO       Or   acetaminophen (TYLENOL) suppository 650 mg  650 mg Rectal Q6H PRN Etta Quill, DO       alum & mag hydroxide-simeth (MAALOX/MYLANTA) 200-200-20 MG/5ML suspension 30 mL  30 mL Oral Q4H PRN Dwyane Dee, MD       enoxaparin (LOVENOX) injection 40 mg  40 mg Subcutaneous Q24H Alcario Drought, Jared M, DO       lactated ringers infusion   Intravenous Continuous Etta Quill, DO 75 mL/hr at 02/27/22 2136 New Bag at 02/27/22 2136   morphine (PF) 2 MG/ML injection 2-4 mg  2-4 mg Intravenous Q4H PRN Etta Quill, DO   4 mg at 02/27/22 2141   nicotine (NICODERM CQ - dosed in mg/24 hours) patch 21 mg  21 mg Transdermal Daily Dwyane Dee, MD   21 mg at 02/28/22 1104   ondansetron (ZOFRAN) tablet 4 mg  4 mg Oral Q6H PRN Etta Quill, DO       Or   ondansetron [facility]) injection 4 mg  4 mg Intravenous Q6H PRN Etta Quill, DO       pantoprazole (PROTONIX) injection 40 mg  40 mg Intravenous Q24H Dwyane Dee, MD        Allergies as of 02/27/2022   (No Known Allergies)     Review of Systems:    Constitutional: No fever or chills Skin: No rash  Cardiovascular: No chest pressure or palpitations   Respiratory: No SOB  Gastrointestinal: See HPI and otherwise negative Genitourinary: No dysuria  Neurological: No headache, dizziness or syncope Musculoskeletal: No new muscle or joint pain Hematologic: No bleeding  Psychiatric: No history of depression or anxiety    Physical Exam:  Vital signs in last 24 hours: Temp:  [98 F (36.7 C)-99.2 F (37.3 C)] 98 F (36.7 C) (07/06 1513) Pulse Rate:  [39-112] 94 (07/06 1513) Resp:  [12-26] 17 (07/06 1513) BP: (121-197)/(80-109) 146/82 (07/06 1513) SpO2:  [95 %-100 %] 99 % (07/06 1513)    General:   Pleasant AA male appears to be in NAD, Well developed, Well nourished, alert and cooperative Head:  Normocephalic and atraumatic. Eyes:   PEERL, EOMI. No icterus. Conjunctiva pink. Ears:  Normal auditory acuity. Neck:  Supple Throat: Oral cavity and pharynx without inflammation, swelling or lesion.  Lungs: Respirations even and unlabored. Lungs clear to auscultation bilaterally.   No wheezes, crackles, or rhonchi.  Heart: Normal S1, S2. No MRG. Regular rate  and rhythm. No peripheral edema, cyanosis or pallor.  Abdomen: Tense, mild distention, marked TTP with involuntary guarding in all 4 quadrants, decreased bowel sounds, some tingling towards the epigastrium, no appreciable masses or hepatomegaly. Rectal:  Not performed.  Msk:  Symmetrical without gross deformities. Peripheral pulses intact.  Extremities:  Without edema, no deformity or joint abnormality. Normal ROM, normal sensation. Neurologic:  Alert and  oriented x4;  grossly normal neurologically.  Skin:   Dry and intact without significant lesions or rashes. Psychiatric: Demonstrates good judgement and reason without abnormal affect or behaviors.   LAB RESULTS: Recent Labs    02/27/22 1235 02/28/22 0432  WBC 6.0 6.8  HGB 12.6* 12.3*  HCT 36.1* 34.8*  PLT 220 192   BMET Recent Labs    02/27/22 1235 02/28/22 0432  NA 130* 130*  K 4.1 4.5  CL 94* 96*  CO2 22 23  GLUCOSE 87 113*  BUN 6* 7*  CREATININE 0.58* 0.60*  CALCIUM 8.3* 8.2*   LFT Recent Labs    02/28/22 0432  PROT 5.8*  ALBUMIN 2.5*  AST 24  ALT 14  ALKPHOS 128*  BILITOT 0.8   STUDIES: IR Paracentesis  Result Date: 02/28/2022 INDICATION: New onset ascites. Request for diagnostic and therapeutic paracentesis. EXAM: ULTRASOUND GUIDED RIGHT LOWER QUADRANT PARACENTESIS MEDICATIONS: 1% plain lidocaine, 3 mL COMPLICATIONS: None immediate. PROCEDURE: Informed written consent was obtained from the patient after a discussion of the risks, benefits  and alternatives to treatment. A timeout was performed prior to the initiation of the procedure. Initial ultrasound scanning demonstrates a large amount of ascites within the right lower abdominal quadrant. The right lower abdomen was prepped and draped in the usual sterile fashion. 1% lidocaine was used for local anesthesia. Following this, a 19 gauge, 7-cm, Yueh catheter was introduced. An ultrasound image was saved for documentation purposes. The paracentesis was performed. The catheter was removed and a dressing was applied. The patient tolerated the procedure well without immediate post procedural complication. FINDINGS: A total of approximately 1 L of serosanguineous fluid was removed. Samples were sent to the laboratory as requested by the clinical team. IMPRESSION: Successful ultrasound-guided paracentesis yielding 1 liters of peritoneal fluid. Read by: Ascencion Dike PA-C Electronically Signed   By: Miachel Roux M.D.   On: 02/28/2022 10:51   CT Abdomen Pelvis W Contrast  Result Date: 02/27/2022 CLINICAL DATA:  Chest pain, shortness of breath for 2 days, decreased appetite, intermittent diarrhea EXAM: CT ABDOMEN AND PELVIS WITH CONTRAST TECHNIQUE: Multidetector CT imaging of the abdomen and pelvis was performed using the standard protocol following bolus administration of intravenous contrast. RADIATION DOSE REDUCTION: This exam was performed according to the departmental dose-optimization program which includes automated exposure control, adjustment of the mA and/or kV according to patient size and/or use of iterative reconstruction technique. CONTRAST:  27m OMNIPAQUE IOHEXOL 300 MG/ML  SOLN COMPARISON:  None Available. FINDINGS: Lower chest: Trace right pleural effusion. No acute airspace disease. Hepatobiliary: No focal liver abnormality is seen. No gallstones, gallbladder wall thickening, or biliary dilatation. Pancreas: Ill-defined hypodense masslike area within the uncinate process of the pancreas  measures 2.1 x 2.4 cm reference image 36/3. While this could reflect focal pancreatitis, underlying pancreatic mass cannot be excluded. Please correlate with clinical presentation. Moderate atrophy of the pancreatic body and tail. Spleen: Normal in size without focal abnormality. Adrenals/Urinary Tract: The kidneys enhance normally and symmetrically. The adrenals and bladder are unremarkable. Stomach/Bowel: No bowel obstruction or ileus. Decompressed appendix is seen within  the right lower quadrant. No bowel wall thickening or inflammatory change. Vascular/Lymphatic: Diffuse atherosclerosis of the aorta and its branches. No pathologic adenopathy. Reproductive: Prostate is unremarkable. Other: There is moderate fluid throughout the abdomen and pelvis, which may be partially loculated. Dependently within the right pericolic gutter is a 5.3 x 3.4 by 7.3 cm hyperdense structure neck could reflect peritoneal mass. Subtle nodularity is seen within the omentum along the transverse mesocolon. Underlying peritoneal carcinomatosis cannot be excluded. Correlation with paracentesis and fluid analysis may be useful. No free intraperitoneal gas.  No abdominal wall hernia. Musculoskeletal: No acute or destructive bony lesions. Reconstructed images demonstrate no additional findings. IMPRESSION: 1. Moderate fluid throughout the abdomen and pelvis, which may be partially loculated. Areas of omental nodularity and suspected soft tissue mass in the right para colic gutter concerning for peritoneal carcinomatosis. Correlation with paracentesis and fluid analysis is recommended. 2. Ill-defined hypodense region within the uncinate process of the pancreas, favor pancreatic mass over focal pancreatitis. Please correlate with clinical presentation and laboratory analysis. 3. Trace right pleural effusion. 4.  Aortic Atherosclerosis (ICD10-I70.0). Electronically Signed   By: Randa Ngo M.D.   On: 02/27/2022 20:25   DG Foot Complete  Right  Result Date: 02/27/2022 CLINICAL DATA:  Pain, hip foot 2 weeks ago. EXAM: RIGHT FOOT COMPLETE - 3+ VIEW COMPARISON:  RIGHT foot evaluation from October 09, 2018. FINDINGS: Osteopenia. Soft tissues are unremarkable. No signs of fracture or of dislocation. Midfoot degenerative changes. IMPRESSION: Midfoot degenerative changes. No acute fracture or dislocation. Electronically Signed   By: Zetta Bills M.D.   On: 02/27/2022 17:04   DG Chest 2 View  Result Date: 02/27/2022 CLINICAL DATA:  Chest pain. EXAM: CHEST - 2 VIEW COMPARISON:  Chest x-ray October 10, 2009. FINDINGS: Streaky bibasilar opacities. No confluent consolidation. No visible pleural effusions or pneumothorax. Biapical pleuroparenchymal scarring. Cardiomediastinal silhouette is within normal limits. IMPRESSION: Streaky bibasilar opacities, which could represent atelectasis or atypical infection. Electronically Signed   By: Margaretha Sheffield M.D.   On: 02/27/2022 12:53      Impression / Plan:   Impression: 1.  Abdominal pain: CT with findings worrisome for peritoneal carcinomatosis, possible pancreatic primary neoplasm on imaging, elevated lipase, history of alcohol use/abuse, now status post IR guided paracentesis with fluid results/cytology pending; most likely malignant process 2.  Alcohol use: He used to drink an 18 pack of beer a day, now 2 40 ounce beers a day over the past year  Plan: 1.  Await results from paracentesis. 2.  Patient may benefit from EUS with FNA of abnormal pancreas in the future 3.  Continue supportive measures  Thank you for your kind consultation, we will continue to follow.  Logan Chambers  02/28/2022, 3:30 PM

## 2022-02-28 NOTE — Assessment & Plan Note (Signed)
-   Patient endorses smoking about 1 PPD for 25 to 30 years - Place nicotine patch

## 2022-02-28 NOTE — Progress Notes (Signed)
Progress Note    Logan Chambers   QIO:[id number]  DOB: Nov 11, 1950  DOA: 02/27/2022     0 PCP: Velna Hatchet, MD  Initial CC: abdominal pain  Hospital Course: Strict/Mr. Eggert is a 71 yo male with PMH ongoing alcohol abuse, HTN, arthritis who presented with epigastric / lower chest pain and some intermittent abdominal pain.  He also reported that the abdominal pain has been present intermittently for the past 2 to 3 years. Notable labs on admission include elevated lipase, 451. CT abdomen/pelvis showed abdominal ascites partly loculated.  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  There was also a mention of ill-defined hypodense region involving the uncinate process of the pancreas raising concern for a pancreatic mass versus focal pancreatitis. He was admitted and underwent paracentesis for diagnostic and therapeutic purposes.  1 L of serosanguineous fluid was removed and sent for further testing.  GI was also consulted on admission for further guidance.  Interval History:  Seen in the ER this morning.  Not complaining of much abdominal pain when seen and was actually asking for food. Confirmed his alcohol intake which he states is currently 2 40s of Delton See that he drinks slowly throughout the day.  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 -He reports approximately 5 to 6 pound weight loss over the past 1 month with associated decreased appetite - lipase elevated but abdominal exam not consistently TTP and patient was even asking for a diet to eat when seen this morning - follow up AFP, CEA, CA 19-9 - given some ambiguity of CT report, will ask for GI assistance in case of need for EUS and biopsy - s/p paracentesis on 7/6; follow up cytology as well - the  PMNs are 220, therefore will hold off on abx at this time (unless felt more strongly by GI), as he has other reasons for his abdominal pain at this time  Alcohol use - history of ~18 pack of beer daily now he reports 2 40oz Delton See daily - currently no s/s of withdrawal but at risk for this; denies tremors between drinks  - if develops symptoms will place on CIWA   Normocytic anemia - MCV is borderline macrocytic.  Given his significant alcohol abuse, check folate and B12  Hyponatremia - Mild.  Etiology suspected in setting of underlying alcohol abuse -Continue LR - BMP in a.m.  Tobacco use - Patient endorses smoking about 1 PPD for 25 to 30 years - Place nicotine patch  Toe injury No fracture on X ray.   Old records reviewed in assessment of this patient  Antimicrobials:   DVT prophylaxis:  enoxaparin (LOVENOX) injection 40 mg Start: 02/28/22 1400   Code Status:   Code Status: Full Code  Mobility Assessment (last 72 hours)     Mobility Assessment   No documentation.           Disposition Plan: Home in 1 to 2 days Status is: Inpatient  Objective: Blood pressure (!) 153/96, pulse 95, temperature 98.9 F (37.2 C), temperature source Oral, resp. rate 18, SpO2 100 %.  Examination:  Physical Exam Constitutional:      General: He is not in acute distress.    Appearance: Normal appearance.  HENT:     Head: Normocephalic and atraumatic.     Mouth/Throat:  Mouth: Mucous membranes are moist.  Eyes:     Extraocular Movements: Extraocular movements intact.  Cardiovascular:     Rate and Rhythm: Normal rate and regular rhythm.     Heart sounds: Normal heart sounds.  Pulmonary:     Effort: Pulmonary effort is normal. No respiratory distress.     Breath sounds: Normal breath sounds. No wheezing.  Abdominal:     General: Bowel sounds are normal. There is no distension.     Palpations: Abdomen is soft.     Comments: Nonspecific tenderness to palpation, but  more notable in the lower quadrants with no R/G  Musculoskeletal:        General: Normal range of motion.     Cervical back: Normal range of motion and neck supple.  Skin:    General: Skin is warm and dry.  Neurological:     General: No focal deficit present.     Mental Status: He is alert.  Psychiatric:        Mood and Affect: Mood normal.        Behavior: Behavior normal.      Consultants:  GI  Procedures:  Paracentesis, 02/28/2022  Data Reviewed: Results for orders placed or performed during the hospital encounter of 02/27/22 (from the past 24 hour(s))  CBC     Status: Abnormal   Collection Time: 02/28/22  4:32 AM  Result Value Ref Range   WBC 6.8 4.0 - 10.5 K/uL   RBC 3.52 (L) 4.22 - 5.81 MIL/uL   Hemoglobin 12.3 (L) 13.0 - 17.0 g/dL   HCT 34.8 (L) 39.0 - 52.0 %   MCV 98.9 80.0 - 100.0 fL   MCH 34.9 (H) 26.0 - 34.0 pg   MCHC 35.3 30.0 - 36.0 g/dL   RDW 11.9 11.5 - 15.5 %   Platelets 192 150 - 400 K/uL   nRBC 0.0 0.0 - 0.2 %  Comprehensive metabolic panel     Status: Abnormal   Collection Time: 02/28/22  4:32 AM  Result Value Ref Range   Sodium 130 (L) 135 - 145 mmol/L   Potassium 4.5 3.5 - 5.1 mmol/L   Chloride 96 (L) 98 - 111 mmol/L   CO2 23 22 - 32 mmol/L   Glucose, Bld 113 (H) 70 - 99 mg/dL   BUN 7 (L) 8 - 23 mg/dL   Creatinine, Ser 0.60 (L) 0.61 - 1.24 mg/dL   Calcium 8.2 (L) 8.9 - 10.3 mg/dL   Total Protein 5.8 (L) 6.5 - 8.1 g/dL   Albumin 2.5 (L) 3.5 - 5.0 g/dL   AST 24 15 - 41 U/L   ALT 14 0 - 44 U/L   Alkaline Phosphatase 128 (H) 38 - 126 U/L   Total Bilirubin 0.8 0.3 - 1.2 mg/dL   GFR, Estimated >60 >60 mL/min   Anion gap 11 5 - 15  Lactate dehydrogenase (pleural or peritoneal fluid)     Status: Abnormal   Collection Time: 02/28/22  9:41 AM  Result Value Ref Range   LD, Fluid 132 (H) 3 - 23 U/L   Fluid Type-FLDH ABDOMEN   Body fluid cell count with differential     Status: Abnormal   Collection Time: 02/28/22  9:41 AM  Result Value Ref Range    Fluid Type-FCT ABDOMEN    Color, Fluid RED (A) YELLOW   Appearance, Fluid CLOUDY (A) CLEAR   Total Nucleated Cell Count, Fluid 480 0 - 1,000 cu mm   Neutrophil Count, Fluid 46 (H)  0 - 25 %   Lymphs, Fluid 14 %   Monocyte-Macrophage-Serous Fluid 38 (L) 50 - 90 %   Eos, Fluid 2 %   Other Cells, Fluid OTHER CELLS IDENTIFIED AS MESOTHELIAL CELLS %  Protein, pleural or peritoneal fluid     Status: None   Collection Time: 02/28/22  9:41 AM  Result Value Ref Range   Total protein, fluid 3.5 g/dL   Fluid Type-FTP ABDOMEN   Gram stain     Status: None   Collection Time: 02/28/22  9:41 AM   Specimen: Peritoneal Washings  Result Value Ref Range   Specimen Description PERITONEAL    Special Requests NONE    Gram Stain      WBC PRESENT,BOTH PMN AND MONONUCLEAR NO ORGANISMS SEEN CYTOSPIN SMEAR Performed at [facility], [place] [address] [place], [place] [address]    Report Status 02/28/2022 FINAL     I have Reviewed nursing notes, Vitals, and Lab results since pt's last encounter. Pertinent lab results : see above I have ordered test including BMP, CBC, Mg I have reviewed the last note from staff over past 24 hours I have discussed pt's care plan and test results with nursing staff, case manager   LOS: 0 days   Dwyane Dee, MD [facility] 02/28/2022, 5:08 PM

## 2022-03-01 DIAGNOSIS — K668 Other specified disorders of peritoneum: Secondary | ICD-10-CM | POA: Diagnosis not present

## 2022-03-01 DIAGNOSIS — K8689 Other specified diseases of pancreas: Secondary | ICD-10-CM | POA: Diagnosis not present

## 2022-03-01 DIAGNOSIS — R1084 Generalized abdominal pain: Secondary | ICD-10-CM | POA: Diagnosis not present

## 2022-03-01 DIAGNOSIS — C800 Disseminated malignant neoplasm, unspecified: Secondary | ICD-10-CM | POA: Diagnosis not present

## 2022-03-01 LAB — CANCER ANTIGEN 19-9: CA 19-9: 13 U/mL (ref 0–35)

## 2022-03-01 LAB — CEA: CEA: 3.2 ng/mL (ref 0.0–4.7)

## 2022-03-01 LAB — AFP TUMOR MARKER: AFP, Serum, Tumor Marker: <1.8 ng/mL (ref 0.0–8.4)

## 2022-03-01 MED ORDER — POLYETHYLENE GLYCOL 3350 17 G PO PACK
17.0000 g | PACK | Freq: Every day | ORAL | Status: DC | PRN
  Administered 2022-03-01: 17 g via ORAL
  Filled 2022-03-01: qty 1

## 2022-03-01 MED ORDER — SENNOSIDES-DOCUSATE SODIUM 8.6-50 MG PO TABS
1.0000 | ORAL_TABLET | Freq: Two times a day (BID) | ORAL | Status: DC
  Administered 2022-03-01 – 2022-03-06 (×7): 1 via ORAL
  Filled 2022-03-01 (×11): qty 1

## 2022-03-01 NOTE — Assessment & Plan Note (Addendum)
-   CT A/P performed on admission shows area of "omental nodularity and suspected soft tissue mass concerning for peritoneal carcinomatosis".  There also appears to be ill-defined hypodense region along the uncinate process of the pancreas -Evaluated by GI and remains available in the background if needed further - Cytology negative from paracentesis fluid.  IR consulted for biopsy of omentum

## 2022-03-01 NOTE — Plan of Care (Signed)
  Problem: Education: Goal: Knowledge of General Education information will improve Description Including pain rating scale, medication(s)/side effects and non-pharmacologic comfort measures Outcome: Progressing   Problem: Health Behavior/Discharge Planning: Goal: Ability to manage health-related needs will improve Outcome: Progressing   

## 2022-03-01 NOTE — Progress Notes (Signed)
   Patient Name: Brewster Wolters Date of Encounter: 03/01/2022, 10:47 AM    Subjective  Some abdominal pain that is positional.  He does feel better after the paracentesis and can eat a little more.   Objective  BP (!) 145/92 (BP Location: Left Arm)   Pulse (!) 102   Temp 98.2 F (36.8 C) (Oral)   Resp 16   SpO2 96%  No acute distress, abdomen remains distended with ascites and somewhat tense.  Cytology from paracentesis remains pending.  It is a high protein fluid. 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  Could be neuroendocrine process perhaps.  Await cytology if unrevealing I would have IR see about biopsy.  That would be available sooner than an EUS.  We will not follow at this point but are certainly available to reassess the patient.  Gatha Mayer, MD, [facility] See Shea Evans on call - gastroenterology for best contact person 03/01/2022 10:47 AM

## 2022-03-01 NOTE — Progress Notes (Addendum)
Progress Note    Logan Chambers   [id number]:[id number]  DOB: May 31, 1951  DOA: 02/27/2022     1 PCP: Velna Hatchet, MD  Initial CC: abdominal pain  Hospital Course: Strict/Mr. Duffner is a 71 yo male with PMH ongoing alcohol abuse, HTN, arthritis who presented with epigastric / lower chest pain and some intermittent abdominal pain.  He also reported that the abdominal pain has been present intermittently for the past 2 to 3 years. Notable labs on admission include elevated lipase, 451. CT abdomen/pelvis showed abdominal ascites partly loculated.  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  There was also a mention of ill-defined hypodense region involving the uncinate process of the pancreas raising concern for a pancreatic mass versus focal pancreatitis. He was admitted and underwent paracentesis for diagnostic and therapeutic purposes.  1 L of serosanguineous fluid was removed and sent for further testing.  GI was also consulted on admission for further guidance.  Interval History:  No events overnight.  Still has some abdominal pain very intermittently but not sustained.  He is tolerating diet and denies any nausea or vomiting.  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 -He reports approximately 5 to 6 pound weight loss over the past 1 month with associated decreased appetite - lipase elevated but abdominal exam not consistently TTP and continues to tolerate diet; will advance slowly -  AFP, CEA, CA 19-9 all negative/normal  - s/p paracentesis on 7/6; follow up cytology as well - the PMNs are 220, therefore will hold off on abx at this time  Mass of omentum - CT A/P performed on admission shows area of "omental nodularity and suspected soft tissue mass concerning for  peritoneal carcinomatosis".  There also appears to be ill-defined hypodense region along the uncinate process of the pancreas - Evaluated by GI.  At this time if cytology negative from paracentesis fluid, recommendation is to pursue possible IR biopsy over EUS - GI available for further issues if needed  Alcohol use - history of ~18 pack of beer daily now he reports 2 40oz Delton See daily - currently no s/s of withdrawal but at risk for this; denies tremors between drinks  - if develops symptoms will place on CIWA   Normocytic anemia - MCV is borderline macrocytic.  Folate and B12 are normal  Hyponatremia - Mild.  Etiology suspected in setting of underlying alcohol abuse -Continue LR - BMP in a.m.  Tobacco use - Patient endorses smoking about 1 PPD for 25 to 30 years - Place nicotine patch  Toe injury No fracture on X ray.   Old records reviewed in assessment of this patient  Antimicrobials:   DVT prophylaxis:  enoxaparin (LOVENOX) injection 40 mg Start: 02/28/22 1400   Code Status:   Code Status: Full Code  Mobility Assessment (last 72 hours)     Mobility Assessment     Row Name 03/01/22 1100 03/01/22 0755 02/28/22 2215 02/28/22 1612     Does patient have an order for bedrest or is patient medically unstable No - Continue assessment No - Continue assessment No - Continue assessment No - Continue assessment    What is the highest level of mobility based on the progressive mobility assessment? Level 5 (Walks with assist in room/hall) - Balance while stepping forward/back and can walk in room with assist -  Complete Level 5 (Walks with assist in room/hall) - Balance while stepping forward/back and can walk in room with assist - Complete Level 5 (Walks with assist in room/hall) - Balance while stepping forward/back and can walk in room with assist - Complete Level 5 (Walks with assist in room/hall) - Balance while stepping forward/back and can walk in room with assist -  Complete             Disposition Plan: Home in 1 to 2 days Status is: Inpatient  Objective: Blood pressure (!) 136/92, pulse (!) 102, temperature 98.3 F (36.8 C), temperature source Oral, resp. rate 17, SpO2 100 %.  Examination:  Physical Exam Constitutional:      General: He is not in acute distress.    Appearance: Normal appearance.  HENT:     Head: Normocephalic and atraumatic.     Mouth/Throat:     Mouth: Mucous membranes are moist.  Eyes:     Extraocular Movements: Extraocular movements intact.  Cardiovascular:     Rate and Rhythm: Normal rate and regular rhythm.     Heart sounds: Normal heart sounds.  Pulmonary:     Effort: Pulmonary effort is normal. No respiratory distress.     Breath sounds: Normal breath sounds. No wheezing.  Abdominal:     General: Bowel sounds are normal. There is no distension.     Palpations: Abdomen is soft.     Comments: Nonspecific tenderness to palpation, but more notable in the lower quadrants with no R/G  Musculoskeletal:        General: Normal range of motion.     Cervical back: Normal range of motion and neck supple.  Skin:    General: Skin is warm and dry.  Neurological:     General: No focal deficit present.     Mental Status: He is alert.  Psychiatric:        Mood and Affect: Mood normal.        Behavior: Behavior normal.      Consultants:  GI  Procedures:  Paracentesis, 02/28/2022  Data Reviewed: Results for orders placed or performed during the hospital encounter of 02/27/22 (from the past 24 hour(s))  Vitamin B12     Status: None   Collection Time: 02/28/22  5:06 PM  Result Value Ref Range   Vitamin B-12 625 180 - 914 pg/mL  Folate     Status: None   Collection Time: 02/28/22  5:06 PM  Result Value Ref Range   Folate 17.3 >5.9 ng/mL    I have Reviewed nursing notes, Vitals, and Lab results since pt's last encounter. Pertinent lab results : see above I have ordered test including BMP, CBC, Mg I have  reviewed the last note from staff over past 24 hours I have discussed pt's care plan and test results with nursing staff, case manager   LOS: 1 day   Dwyane Dee, MD [facility] 03/01/2022, 4:22 PM

## 2022-03-02 DIAGNOSIS — K668 Other specified disorders of peritoneum: Secondary | ICD-10-CM | POA: Diagnosis not present

## 2022-03-02 DIAGNOSIS — R1084 Generalized abdominal pain: Secondary | ICD-10-CM | POA: Diagnosis not present

## 2022-03-02 LAB — CBC WITH DIFFERENTIAL/PLATELET
Abs Immature Granulocytes: 0.01 10*3/uL (ref 0.00–0.07)
Basophils Absolute: 0 10*3/uL (ref 0.0–0.1)
Basophils Relative: 0 %
Eosinophils Absolute: 0 10*3/uL (ref 0.0–0.5)
Eosinophils Relative: 0 %
HCT: 31.6 % — ABNORMAL LOW (ref 39.0–52.0)
Hemoglobin: 11.2 g/dL — ABNORMAL LOW (ref 13.0–17.0)
Immature Granulocytes: 0 %
Lymphocytes Relative: 17 %
Lymphs Abs: 0.8 10*3/uL (ref 0.7–4.0)
MCH: 34.8 pg — ABNORMAL HIGH (ref 26.0–34.0)
MCHC: 35.4 g/dL (ref 30.0–36.0)
MCV: 98.1 fL (ref 80.0–100.0)
Monocytes Absolute: 1 10*3/uL (ref 0.1–1.0)
Monocytes Relative: 19 %
Neutro Abs: 3.1 10*3/uL (ref 1.7–7.7)
Neutrophils Relative %: 64 %
Platelets: 219 10*3/uL (ref 150–400)
RBC: 3.22 MIL/uL — ABNORMAL LOW (ref 4.22–5.81)
RDW: 11.7 % (ref 11.5–15.5)
WBC: 4.9 10*3/uL (ref 4.0–10.5)
nRBC: 0 % (ref 0.0–0.2)

## 2022-03-02 LAB — COMPREHENSIVE METABOLIC PANEL
ALT: 13 U/L (ref 0–44)
AST: 31 U/L (ref 15–41)
Albumin: 2.1 g/dL — ABNORMAL LOW (ref 3.5–5.0)
Alkaline Phosphatase: 90 U/L (ref 38–126)
Anion gap: 9 (ref 5–15)
BUN: 8 mg/dL (ref 8–23)
CO2: 22 mmol/L (ref 22–32)
Calcium: 7.8 mg/dL — ABNORMAL LOW (ref 8.9–10.3)
Chloride: 99 mmol/L (ref 98–111)
Creatinine, Ser: 0.66 mg/dL (ref 0.61–1.24)
GFR, Estimated: >60 mL/min (ref >60)
Glucose, Bld: 91 mg/dL (ref 70–99)
Potassium: 4.7 mmol/L (ref 3.5–5.1)
Sodium: 130 mmol/L — ABNORMAL LOW (ref 135–145)
Total Bilirubin: 1.2 mg/dL (ref 0.3–1.2)
Total Protein: 5.5 g/dL — ABNORMAL LOW (ref 6.5–8.1)

## 2022-03-02 LAB — MAGNESIUM: Magnesium: 1.9 mg/dL (ref 1.7–2.4)

## 2022-03-02 MED ORDER — POLYETHYLENE GLYCOL 3350 17 G PO PACK
17.0000 g | PACK | Freq: Every day | ORAL | Status: DC
Start: 2022-03-02 — End: 2022-03-07
  Administered 2022-03-03 – 2022-03-06 (×3): 17 g via ORAL
  Filled 2022-03-02 (×5): qty 1

## 2022-03-02 NOTE — Progress Notes (Signed)
Progress Note    Logan Chambers   [id number]  DOB: 1950-10-29  DOA: 02/27/2022     2 PCP: Velna Hatchet, MD  Initial CC: abdominal pain  Hospital Course: Strict/[name] is a 71 yo male with PMH ongoing alcohol abuse, HTN, arthritis who presented with epigastric / lower chest pain and some intermittent abdominal pain.  He also reported that the abdominal pain has been present intermittently for the past 2 to 3 years. Notable labs on admission include elevated lipase, 451. CT abdomen/pelvis showed abdominal ascites partly loculated.  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  There was also a mention of ill-defined hypodense region involving the uncinate process of the pancreas raising concern for a pancreatic mass versus focal pancreatitis. He was admitted and underwent paracentesis for diagnostic and therapeutic purposes.  1 L of serosanguineous fluid was removed and sent for further testing.  GI was also consulted on admission for further guidance.  Interval History:  No events overnight.  Ambulated with mobility specialist this morning.  Still has ongoing intermittent abdominal pain but tolerating it okay.  Also tolerating diet.  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 -He reports approximately 5 to 6 pound weight loss over the past 1 month with associated decreased appetite - lipase elevated but abdominal exam not consistently TTP and continues to tolerate diet -  AFP, CEA, CA 19-9 all negative/normal  - s/p paracentesis on 7/6; follow up cytology as well - the PMNs are 220, therefore will hold off on abx at this time  Mass of omentum - CT A/P performed on admission shows area of "omental nodularity and suspected soft tissue mass concerning for  peritoneal carcinomatosis".  There also appears to be ill-defined hypodense region along the uncinate process of the pancreas - Evaluated by GI.  At this time if cytology negative from paracentesis fluid, recommendation is to pursue possible IR biopsy over EUS - GI available for further issues if needed  Alcohol use - history of ~18 pack of beer daily now he reports 2 40oz Delton See daily - currently no s/s of withdrawal but at risk for this; denies tremors between drinks  - if develops symptoms will place on CIWA   Normocytic anemia - MCV is borderline macrocytic.  Folate and B12 are normal  Hyponatremia - Mild.  Etiology suspected in setting of underlying alcohol abuse -Continue LR - BMP in a.m.  Tobacco use - Patient endorses smoking about 1 PPD for 25 to 30 years - Place nicotine patch  Toe injury No fracture on X ray.   Old records reviewed in assessment of this patient  Antimicrobials:   DVT prophylaxis:  enoxaparin (LOVENOX) injection 40 mg Start: 02/28/22 1400   Code Status:   Code Status: Full Code  Mobility Assessment (last 72 hours)     Mobility Assessment     Row Name 03/02/22 1054 03/01/22 1100 03/01/22 0755 02/28/22 2215 02/28/22 1612   Does patient have an order for bedrest or is patient medically unstable No - Continue assessment No - Continue assessment No - Continue assessment No - Continue assessment No - Continue assessment   What is the highest level of mobility based on the progressive mobility assessment? Level 5 (Walks with assist in room/hall) - Balance while stepping forward/back and can walk in room with assist -  Complete Level 5 (Walks with assist in room/hall) - Balance while stepping forward/back and can walk in room with assist - Complete Level 5 (Walks with assist in room/hall) - Balance while stepping forward/back and can walk in room with assist - Complete Level 5 (Walks with assist in room/hall) - Balance while stepping forward/back and  can walk in room with assist - Complete Level 5 (Walks with assist in room/hall) - Balance while stepping forward/back and can walk in room with assist - Complete            Disposition Plan: Home in 1 to 2 days Status is: Inpatient  Objective: Blood pressure 130/87, pulse 91, temperature 97.7 F (36.5 C), resp. rate 17, SpO2 100 %.  Examination:  Physical Exam Constitutional:      General: He is not in acute distress.    Appearance: Normal appearance.  HENT:     Head: Normocephalic and atraumatic.     Mouth/Throat:     Mouth: Mucous membranes are moist.  Eyes:     Extraocular Movements: Extraocular movements intact.  Cardiovascular:     Rate and Rhythm: Normal rate and regular rhythm.     Heart sounds: Normal heart sounds.  Pulmonary:     Effort: Pulmonary effort is normal. No respiratory distress.     Breath sounds: Normal breath sounds. No wheezing.  Abdominal:     General: Bowel sounds are normal. There is no distension.     Palpations: Abdomen is soft.     Comments: Nonspecific tenderness to palpation, but more notable in the lower quadrants with no R/G  Musculoskeletal:        General: Normal range of motion.     Cervical back: Normal range of motion and neck supple.  Skin:    General: Skin is warm and dry.  Neurological:     General: No focal deficit present.     Mental Status: He is alert.  Psychiatric:        Mood and Affect: Mood normal.        Behavior: Behavior normal.      Consultants:  GI  Procedures:  Paracentesis, 02/28/2022  Data Reviewed: Results for orders placed or performed during the hospital encounter of 02/27/22 (from the past 24 hour(s))  Comprehensive metabolic panel     Status: Abnormal   Collection Time: 03/02/22  1:15 AM  Result Value Ref Range   Sodium 130 (L) 135 - 145 mmol/L   Potassium 4.7 3.5 - 5.1 mmol/L   Chloride 99 98 - 111 mmol/L   CO2 22 22 - 32 mmol/L   Glucose, Bld 91 70 - 99 mg/dL   BUN 8 8 - 23 mg/dL    Creatinine, Ser 0.66 0.61 - 1.24 mg/dL   Calcium 7.8 (L) 8.9 - 10.3 mg/dL   Total Protein 5.5 (L) 6.5 - 8.1 g/dL   Albumin 2.1 (L) 3.5 - 5.0 g/dL   AST 31 15 - 41 U/L   ALT 13 0 - 44 U/L   Alkaline Phosphatase 90 38 - 126 U/L   Total Bilirubin 1.2 0.3 - 1.2 mg/dL   GFR, Estimated >60 >60 mL/min   Anion gap 9 5 - 15  CBC with Differential/Platelet     Status: Abnormal   Collection Time: 03/02/22  1:15 AM  Result Value Ref Range   WBC 4.9 4.0 - 10.5 K/uL   RBC 3.22 (L) 4.22 - 5.81 MIL/uL   Hemoglobin 11.2 (L) 13.0 - 17.0 g/dL  HCT 31.6 (L) 39.0 - 52.0 %   MCV 98.1 80.0 - 100.0 fL   MCH 34.8 (H) 26.0 - 34.0 pg   MCHC 35.4 30.0 - 36.0 g/dL   RDW 11.7 11.5 - 15.5 %   Platelets 219 150 - 400 K/uL   nRBC 0.0 0.0 - 0.2 %   Neutrophils Relative % 64 %   Neutro Abs 3.1 1.7 - 7.7 K/uL   Lymphocytes Relative 17 %   Lymphs Abs 0.8 0.7 - 4.0 K/uL   Monocytes Relative 19 %   Monocytes Absolute 1.0 0.1 - 1.0 K/uL   Eosinophils Relative 0 %   Eosinophils Absolute 0.0 0.0 - 0.5 K/uL   Basophils Relative 0 %   Basophils Absolute 0.0 0.0 - 0.1 K/uL   Immature Granulocytes 0 %   Abs Immature Granulocytes 0.01 0.00 - 0.07 K/uL  Magnesium     Status: None   Collection Time: 03/02/22  1:15 AM  Result Value Ref Range   Magnesium 1.9 1.7 - 2.4 mg/dL    I have Reviewed nursing notes, Vitals, and Lab results since pt's last encounter. Pertinent lab results : see above I have ordered test including BMP, CBC, Mg I have reviewed the last note from staff over past 24 hours I have discussed pt's care plan and test results with nursing staff, case manager   LOS: 2 days   Dwyane Dee, MD [facility] 03/02/2022, 6:09 PM

## 2022-03-02 NOTE — Plan of Care (Signed)

## 2022-03-02 NOTE — Progress Notes (Signed)
Mobility Specialist Progress Note:   03/02/22 1121  Mobility  Activity Ambulated with assistance in hallway  Level of Assistance Contact guard assist, steadying assist  Assistive Device Cane  Distance Ambulated (ft) 300 ft  Activity Response Tolerated well  $Mobility charge 1 Mobility   Pt received in bed willing to participate in mobility. No complaints of pain. Left in bed with call bell in reach and all needs met.   [facility] Logan Chambers Mobility Specialist

## 2022-03-03 DIAGNOSIS — R1084 Generalized abdominal pain: Secondary | ICD-10-CM | POA: Diagnosis not present

## 2022-03-03 DIAGNOSIS — K668 Other specified disorders of peritoneum: Secondary | ICD-10-CM | POA: Diagnosis not present

## 2022-03-03 DIAGNOSIS — K5900 Constipation, unspecified: Secondary | ICD-10-CM

## 2022-03-03 LAB — CBC WITH DIFFERENTIAL/PLATELET
Abs Immature Granulocytes: 0.01 10*3/uL (ref 0.00–0.07)
Basophils Absolute: 0 10*3/uL (ref 0.0–0.1)
Basophils Relative: 1 %
Eosinophils Absolute: 0.1 10*3/uL (ref 0.0–0.5)
Eosinophils Relative: 2 %
HCT: 25.7 % — ABNORMAL LOW (ref 39.0–52.0)
Hemoglobin: 9.3 g/dL — ABNORMAL LOW (ref 13.0–17.0)
Immature Granulocytes: 0 %
Lymphocytes Relative: 17 %
Lymphs Abs: 0.7 10*3/uL (ref 0.7–4.0)
MCH: 35.1 pg — ABNORMAL HIGH (ref 26.0–34.0)
MCHC: 36.2 g/dL — ABNORMAL HIGH (ref 30.0–36.0)
MCV: 97 fL (ref 80.0–100.0)
Monocytes Absolute: 0.7 10*3/uL (ref 0.1–1.0)
Monocytes Relative: 16 %
Neutro Abs: 2.8 10*3/uL (ref 1.7–7.7)
Neutrophils Relative %: 64 %
Platelets: 222 10*3/uL (ref 150–400)
RBC: 2.65 MIL/uL — ABNORMAL LOW (ref 4.22–5.81)
RDW: 11.9 % (ref 11.5–15.5)
WBC: 4.3 10*3/uL (ref 4.0–10.5)
nRBC: 0 % (ref 0.0–0.2)

## 2022-03-03 LAB — COMPREHENSIVE METABOLIC PANEL
ALT: 14 U/L (ref 0–44)
AST: 24 U/L (ref 15–41)
Albumin: 1.8 g/dL — ABNORMAL LOW (ref 3.5–5.0)
Alkaline Phosphatase: 83 U/L (ref 38–126)
Anion gap: 9 (ref 5–15)
BUN: 10 mg/dL (ref 8–23)
CO2: 24 mmol/L (ref 22–32)
Calcium: 7.6 mg/dL — ABNORMAL LOW (ref 8.9–10.3)
Chloride: 97 mmol/L — ABNORMAL LOW (ref 98–111)
Creatinine, Ser: 0.72 mg/dL (ref 0.61–1.24)
GFR, Estimated: >60 mL/min (ref >60)
Glucose, Bld: 87 mg/dL (ref 70–99)
Potassium: 3.6 mmol/L (ref 3.5–5.1)
Sodium: 130 mmol/L — ABNORMAL LOW (ref 135–145)
Total Bilirubin: 0.9 mg/dL (ref 0.3–1.2)
Total Protein: 4.9 g/dL — ABNORMAL LOW (ref 6.5–8.1)

## 2022-03-03 LAB — MAGNESIUM: Magnesium: 1.8 mg/dL (ref 1.7–2.4)

## 2022-03-03 MED ORDER — POTASSIUM CHLORIDE CRYS ER 20 MEQ PO TBCR
40.0000 meq | EXTENDED_RELEASE_TABLET | Freq: Once | ORAL | Status: AC
  Administered 2022-03-03: 40 meq via ORAL
  Filled 2022-03-03: qty 2

## 2022-03-03 MED ORDER — MAGNESIUM OXIDE -MG SUPPLEMENT 400 (240 MG) MG PO TABS
800.0000 mg | ORAL_TABLET | Freq: Once | ORAL | Status: AC
  Administered 2022-03-03: 800 mg via ORAL
  Filled 2022-03-03: qty 2

## 2022-03-03 NOTE — Assessment & Plan Note (Signed)
-   Has been intermittently refusing laxatives.  Continue offering

## 2022-03-03 NOTE — Progress Notes (Signed)
Progress Note    Logan Chambers   [id number]:[id number]  DOB: [birth year]  DOA: 02/27/2022     3 PCP: Velna Hatchet, MD  Initial CC: abdominal pain  Hospital Course: Strict/Mr. Tsutsui is a 71 yo male with PMH ongoing alcohol abuse, HTN, arthritis who presented with epigastric / lower chest pain and some intermittent abdominal pain.  He also reported that the abdominal pain has been present intermittently for the past 2 to 3 years. Notable labs on admission include elevated lipase, 451. CT abdomen/pelvis showed abdominal ascites partly loculated.  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  There was also a mention of ill-defined hypodense region involving the uncinate process of the pancreas raising concern for a pancreatic mass versus focal pancreatitis. He was admitted and underwent paracentesis for diagnostic and therapeutic purposes.  1 L of serosanguineous fluid was removed and sent for further testing.  GI was also consulted on admission for further guidance.  Interval History:  No events overnight.  Starting to take some of his laxatives.  Intermittent abdominal pain continues.  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 -He reports approximately 5 to 6 pound weight loss over the past 1 month with associated decreased appetite - lipase elevated but abdominal exam not consistently TTP and continues to tolerate diet -  AFP, CEA, CA 19-9 all negative/normal  - s/p paracentesis on 7/6; follow up cytology as well - the PMNs are 220, therefore will hold off on abx at this time  Mass of omentum - CT A/P performed on admission shows area of "omental nodularity and suspected soft tissue mass concerning for peritoneal carcinomatosis".  There also appears to be ill-defined  hypodense region along the uncinate process of the pancreas - Evaluated by GI.  At this time if cytology negative from paracentesis fluid, recommendation is to pursue possible IR biopsy over EUS - GI available for further issues if needed  Alcohol use - history of ~18 pack of beer daily now he reports 2 40oz Delton See daily - currently no s/s of withdrawal but at risk for this; denies tremors between drinks  - if develops symptoms will place on CIWA   Constipation - Has been intermittently refusing laxatives.  Continue offering  Normocytic anemia - MCV is borderline macrocytic.  Folate and B12 are normal  Hyponatremia - Mild.  Etiology suspected in setting of underlying alcohol abuse -Continue LR - BMP in a.m.  Tobacco use - Patient endorses smoking about 1 PPD for 25 to 30 years - Place nicotine patch  Toe injury No fracture on X ray.   Old records reviewed in assessment of this patient  Antimicrobials:   DVT prophylaxis:  enoxaparin (LOVENOX) injection 40 mg Start: 02/28/22 1400   Code Status:   Code Status: Full Code  Mobility Assessment (last 72 hours)     Mobility Assessment     Row Name 03/03/22 0830 03/02/22 1928 03/02/22 1054 03/01/22 1100 03/01/22 0755   Does patient have an order for bedrest or is patient medically unstable No - Continue assessment No - Continue assessment No - Continue assessment No - Continue assessment No - Continue assessment   What is the highest level of mobility based on the progressive mobility assessment? Level 5 (Walks with assist in room/hall) - Balance while stepping forward/back and can walk in room  with assist - Complete Level 5 (Walks with assist in room/hall) - Balance while stepping forward/back and can walk in room with assist - Complete Level 5 (Walks with assist in room/hall) - Balance while stepping forward/back and can walk in room with assist - Complete Level 5 (Walks with assist in room/hall) - Balance while stepping  forward/back and can walk in room with assist - Complete Level 5 (Walks with assist in room/hall) - Balance while stepping forward/back and can walk in room with assist - Complete    Row Name 02/28/22 2215 02/28/22 1612         Does patient have an order for bedrest or is patient medically unstable No - Continue assessment No - Continue assessment      What is the highest level of mobility based on the progressive mobility assessment? Level 5 (Walks with assist in room/hall) - Balance while stepping forward/back and can walk in room with assist - Complete Level 5 (Walks with assist in room/hall) - Balance while stepping forward/back and can walk in room with assist - Complete               Disposition Plan: Home in 1 to 2 days Status is: Inpatient  Objective: Blood pressure 128/76, pulse 93, temperature 98.3 F (36.8 C), temperature source Oral, resp. rate 17, SpO2 99 %.  Examination:  Physical Exam Constitutional:      General: He is not in acute distress.    Appearance: Normal appearance.  HENT:     Head: Normocephalic and atraumatic.     Mouth/Throat:     Mouth: Mucous membranes are moist.  Eyes:     Extraocular Movements: Extraocular movements intact.  Cardiovascular:     Rate and Rhythm: Normal rate and regular rhythm.     Heart sounds: Normal heart sounds.  Pulmonary:     Effort: Pulmonary effort is normal. No respiratory distress.     Breath sounds: Normal breath sounds. No wheezing.  Abdominal:     General: Bowel sounds are normal. There is no distension.     Palpations: Abdomen is soft.     Comments: Nonspecific tenderness to palpation, but more notable in the lower quadrants with no R/G  Musculoskeletal:        General: Normal range of motion.     Cervical back: Normal range of motion and neck supple.  Skin:    General: Skin is warm and dry.  Neurological:     General: No focal deficit present.     Mental Status: He is alert.  Psychiatric:        Mood and  Affect: Mood normal.        Behavior: Behavior normal.      Consultants:  GI  Procedures:  Paracentesis, 02/28/2022  Data Reviewed: Results for orders placed or performed during the hospital encounter of 02/27/22 (from the past 24 hour(s))  Comprehensive metabolic panel     Status: Abnormal   Collection Time: 03/03/22 12:59 AM  Result Value Ref Range   Sodium 130 (L) 135 - 145 mmol/L   Potassium 3.6 3.5 - 5.1 mmol/L   Chloride 97 (L) 98 - 111 mmol/L   CO2 24 22 - 32 mmol/L   Glucose, Bld 87 70 - 99 mg/dL   BUN 10 8 - 23 mg/dL   Creatinine, Ser 0.72 0.61 - 1.24 mg/dL   Calcium 7.6 (L) 8.9 - 10.3 mg/dL   Total Protein 4.9 (L) 6.5 - 8.1 g/dL  Albumin 1.8 (L) 3.5 - 5.0 g/dL   AST 24 15 - 41 U/L   ALT 14 0 - 44 U/L   Alkaline Phosphatase 83 38 - 126 U/L   Total Bilirubin 0.9 0.3 - 1.2 mg/dL   GFR, Estimated >60 >60 mL/min   Anion gap 9 5 - 15  CBC with Differential/Platelet     Status: Abnormal   Collection Time: 03/03/22 12:59 AM  Result Value Ref Range   WBC 4.3 4.0 - 10.5 K/uL   RBC 2.65 (L) 4.22 - 5.81 MIL/uL   Hemoglobin 9.3 (L) 13.0 - 17.0 g/dL   HCT 25.7 (L) 39.0 - 52.0 %   MCV 97.0 80.0 - 100.0 fL   MCH 35.1 (H) 26.0 - 34.0 pg   MCHC 36.2 (H) 30.0 - 36.0 g/dL   RDW 11.9 11.5 - 15.5 %   Platelets 222 150 - 400 K/uL   nRBC 0.0 0.0 - 0.2 %   Neutrophils Relative % 64 %   Neutro Abs 2.8 1.7 - 7.7 K/uL   Lymphocytes Relative 17 %   Lymphs Abs 0.7 0.7 - 4.0 K/uL   Monocytes Relative 16 %   Monocytes Absolute 0.7 0.1 - 1.0 K/uL   Eosinophils Relative 2 %   Eosinophils Absolute 0.1 0.0 - 0.5 K/uL   Basophils Relative 1 %   Basophils Absolute 0.0 0.0 - 0.1 K/uL   Immature Granulocytes 0 %   Abs Immature Granulocytes 0.01 0.00 - 0.07 K/uL  Magnesium     Status: None   Collection Time: 03/03/22 12:59 AM  Result Value Ref Range   Magnesium 1.8 1.7 - 2.4 mg/dL    I have Reviewed nursing notes, Vitals, and Lab results since pt's last encounter. Pertinent lab results  : see above I have ordered test including BMP, CBC, Mg I have reviewed the last note from staff over past 24 hours I have discussed pt's care plan and test results with nursing staff, case manager   LOS: 3 days   Dwyane Dee, MD [facility] 03/03/2022, 3:52 PM

## 2022-03-04 DIAGNOSIS — K5900 Constipation, unspecified: Secondary | ICD-10-CM | POA: Diagnosis not present

## 2022-03-04 DIAGNOSIS — R1084 Generalized abdominal pain: Secondary | ICD-10-CM | POA: Diagnosis not present

## 2022-03-04 LAB — CBC WITH DIFFERENTIAL/PLATELET
Abs Immature Granulocytes: 0.02 10*3/uL (ref 0.00–0.07)
Basophils Absolute: 0 10*3/uL (ref 0.0–0.1)
Basophils Relative: 0 %
Eosinophils Absolute: 0.1 10*3/uL (ref 0.0–0.5)
Eosinophils Relative: 2 %
HCT: 22 % — ABNORMAL LOW (ref 39.0–52.0)
Hemoglobin: 7.9 g/dL — ABNORMAL LOW (ref 13.0–17.0)
Immature Granulocytes: 0 %
Lymphocytes Relative: 20 %
Lymphs Abs: 1 10*3/uL (ref 0.7–4.0)
MCH: 35.1 pg — ABNORMAL HIGH (ref 26.0–34.0)
MCHC: 35.9 g/dL (ref 30.0–36.0)
MCV: 97.8 fL (ref 80.0–100.0)
Monocytes Absolute: 0.7 10*3/uL (ref 0.1–1.0)
Monocytes Relative: 13 %
Neutro Abs: 3.4 10*3/uL (ref 1.7–7.7)
Neutrophils Relative %: 65 %
Platelets: 234 10*3/uL (ref 150–400)
RBC: 2.25 MIL/uL — ABNORMAL LOW (ref 4.22–5.81)
RDW: 11.9 % (ref 11.5–15.5)
WBC: 5.3 10*3/uL (ref 4.0–10.5)
nRBC: 0 % (ref 0.0–0.2)

## 2022-03-04 LAB — COMPREHENSIVE METABOLIC PANEL
ALT: 14 U/L (ref 0–44)
AST: 24 U/L (ref 15–41)
Albumin: 1.8 g/dL — ABNORMAL LOW (ref 3.5–5.0)
Alkaline Phosphatase: 74 U/L (ref 38–126)
Anion gap: 7 (ref 5–15)
BUN: 9 mg/dL (ref 8–23)
CO2: 24 mmol/L (ref 22–32)
Calcium: 7.6 mg/dL — ABNORMAL LOW (ref 8.9–10.3)
Chloride: 102 mmol/L (ref 98–111)
Creatinine, Ser: 0.56 mg/dL — ABNORMAL LOW (ref 0.61–1.24)
GFR, Estimated: >60 mL/min (ref >60)
Glucose, Bld: 83 mg/dL (ref 70–99)
Potassium: 4.1 mmol/L (ref 3.5–5.1)
Sodium: 133 mmol/L — ABNORMAL LOW (ref 135–145)
Total Bilirubin: 0.8 mg/dL (ref 0.3–1.2)
Total Protein: 4.7 g/dL — ABNORMAL LOW (ref 6.5–8.1)

## 2022-03-04 LAB — MISC LABCORP TEST (SEND OUT): Labcorp test code: 9985

## 2022-03-04 LAB — MAGNESIUM: Magnesium: 1.9 mg/dL (ref 1.7–2.4)

## 2022-03-04 LAB — CYTOLOGY - NON PAP

## 2022-03-04 NOTE — Progress Notes (Signed)
Mobility Specialist Progress Note:   03/04/22 1340  Mobility  Activity Ambulated with assistance in hallway  Level of Assistance Contact guard assist, steadying assist  Assistive Device Cane  Distance Ambulated (ft) 300 ft  Activity Response Tolerated well  $Mobility charge 1 Mobility   Pt agreeable to mobility session. Required contact guard assist with gait. Pt left in BR, educated to pull string when done. Will f/u to get back to bed.   Nelta Numbers [facility] Secure Chat or Office Phone: [phone]

## 2022-03-04 NOTE — Progress Notes (Signed)
Progress Note    Logan Chambers   [id number]:[id number]  DOB: 1951/02/25  DOA: 02/27/2022     4 PCP: Velna Hatchet, MD  Initial CC: abdominal pain  Hospital Course: [name] is a 71 yo male with PMH ongoing alcohol abuse, HTN, arthritis who presented with epigastric / lower chest pain and some intermittent abdominal pain.  He also reported that the abdominal pain has been present intermittently for the past 2 to 3 years. Notable labs on admission include elevated lipase, 451. CT abdomen/pelvis showed abdominal ascites partly loculated.  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  There was also a mention of ill-defined hypodense region involving the uncinate process of the pancreas raising concern for a pancreatic mass versus focal pancreatitis. He was admitted and underwent paracentesis for diagnostic and therapeutic purposes.  1 L of serosanguineous fluid was removed and sent for further testing.  GI was also consulted on admission for further guidance.  Interval History:  No events overnight.  Eating well.   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 -He reports approximately 5 to 6 pound weight loss over the past 1 month with associated decreased appetite - lipase elevated but abdominal exam not consistently TTP and continues to tolerate diet -  AFP, CEA, CA 19-9 all negative/normal  - s/p paracentesis on 7/6; follow up cytology as well - the PMNs are 220, therefore will hold off on abx at this time  Mass of omentum - CT A/P performed on admission shows area of "omental nodularity and suspected soft tissue mass concerning for peritoneal carcinomatosis".  There also appears to be ill-defined hypodense region along the uncinate process of the pancreas -  Evaluated by GI.  At this time if cytology negative from paracentesis fluid, recommendation is to pursue possible IR biopsy over EUS - GI available for further issues if needed  Alcohol use - history of ~18 pack of beer daily now he reports 2 40oz [place] daily - currently no s/s of withdrawal but at risk for this; denies tremors between drinks  - if develops symptoms will place on CIWA   Constipation - Has been intermittently refusing laxatives.  Continue offering  Normocytic anemia - MCV is borderline macrocytic.  Folate and B12 are normal  Hyponatremia - Mild.  Etiology suspected in setting of underlying alcohol abuse -Continue LR - BMP in a.m.  Tobacco use - Patient endorses smoking about 1 PPD for 25 to 30 years - Place nicotine patch  Toe injury No fracture on X ray.   Old records reviewed in assessment of this patient  Antimicrobials:   DVT prophylaxis:  enoxaparin (LOVENOX) injection 40 mg Start: 02/28/22 1400   Code Status:   Code Status: Full Code  Mobility Assessment (last 72 hours)     Mobility Assessment     Row Name 03/04/22 07:36:20 03/03/22 1906 03/03/22 0830 03/02/22 1928 03/02/22 1054   Does patient have an order for bedrest or is patient medically unstable No - Continue assessment No - Continue assessment No - Continue assessment No - Continue assessment No - Continue assessment   What is the highest level of mobility based on the progressive mobility assessment? Level 5 (Walks with assist in room/hall) - Balance while stepping forward/back and can walk in room with assist - Complete Level 5 (Walks with assist  in room/hall) - Balance while stepping forward/back and can walk in room with assist - Complete Level 5 (Walks with assist in room/hall) - Balance while stepping forward/back and can walk in room with assist - Complete Level 5 (Walks with assist in room/hall) - Balance while stepping forward/back and can walk in room with assist - Complete Level  5 (Walks with assist in room/hall) - Balance while stepping forward/back and can walk in room with assist - Complete            Disposition Plan: Home in 1 to 2 days Status is: Inpatient  Objective: Blood pressure 136/81, pulse 97, temperature 98.4 F (36.9 C), temperature source Oral, resp. rate 17, SpO2 100 %.  Examination:  Physical Exam Constitutional:      General: He is not in acute distress.    Appearance: Normal appearance.  HENT:     Head: Normocephalic and atraumatic.     Mouth/Throat:     Mouth: Mucous membranes are moist.  Eyes:     Extraocular Movements: Extraocular movements intact.  Cardiovascular:     Rate and Rhythm: Normal rate and regular rhythm.     Heart sounds: Normal heart sounds.  Pulmonary:     Effort: Pulmonary effort is normal. No respiratory distress.     Breath sounds: Normal breath sounds. No wheezing.  Abdominal:     General: Bowel sounds are normal. There is no distension.     Palpations: Abdomen is soft.     Comments: Nonspecific tenderness to palpation, but more notable in the lower quadrants with no R/G  Musculoskeletal:        General: Normal range of motion.     Cervical back: Normal range of motion and neck supple.  Skin:    General: Skin is warm and dry.  Neurological:     General: No focal deficit present.     Mental Status: He is alert.  Psychiatric:        Mood and Affect: Mood normal.        Behavior: Behavior normal.      Consultants:  GI  Procedures:  Paracentesis, 02/28/2022  Data Reviewed: Results for orders placed or performed during the hospital encounter of 02/27/22 (from the past 24 hour(s))  Comprehensive metabolic panel     Status: Abnormal   Collection Time: 03/04/22  1:19 AM  Result Value Ref Range   Sodium 133 (L) 135 - 145 mmol/L   Potassium 4.1 3.5 - 5.1 mmol/L   Chloride 102 98 - 111 mmol/L   CO2 24 22 - 32 mmol/L   Glucose, Bld 83 70 - 99 mg/dL   BUN 9 8 - 23 mg/dL   Creatinine, Ser 0.56 (L)  0.61 - 1.24 mg/dL   Calcium 7.6 (L) 8.9 - 10.3 mg/dL   Total Protein 4.7 (L) 6.5 - 8.1 g/dL   Albumin 1.8 (L) 3.5 - 5.0 g/dL   AST 24 15 - 41 U/L   ALT 14 0 - 44 U/L   Alkaline Phosphatase 74 38 - 126 U/L   Total Bilirubin 0.8 0.3 - 1.2 mg/dL   GFR, Estimated >60 >60 mL/min   Anion gap 7 5 - 15  CBC with Differential/Platelet     Status: Abnormal   Collection Time: 03/04/22  1:19 AM  Result Value Ref Range   WBC 5.3 4.0 - 10.5 K/uL   RBC 2.25 (L) 4.22 - 5.81 MIL/uL   Hemoglobin 7.9 (L) 13.0 - 17.0 g/dL   HCT  22.0 (L) 39.0 - 52.0 %   MCV 97.8 80.0 - 100.0 fL   MCH 35.1 (H) 26.0 - 34.0 pg   MCHC 35.9 30.0 - 36.0 g/dL   RDW 11.9 11.5 - 15.5 %   Platelets 234 150 - 400 K/uL   nRBC 0.0 0.0 - 0.2 %   Neutrophils Relative % 65 %   Neutro Abs 3.4 1.7 - 7.7 K/uL   Lymphocytes Relative 20 %   Lymphs Abs 1.0 0.7 - 4.0 K/uL   Monocytes Relative 13 %   Monocytes Absolute 0.7 0.1 - 1.0 K/uL   Eosinophils Relative 2 %   Eosinophils Absolute 0.1 0.0 - 0.5 K/uL   Basophils Relative 0 %   Basophils Absolute 0.0 0.0 - 0.1 K/uL   Immature Granulocytes 0 %   Abs Immature Granulocytes 0.02 0.00 - 0.07 K/uL  Magnesium     Status: None   Collection Time: 03/04/22  1:19 AM  Result Value Ref Range   Magnesium 1.9 1.7 - 2.4 mg/dL    I have Reviewed nursing notes, Vitals, and Lab results since pt's last encounter. Pertinent lab results : see above I have ordered test including BMP, CBC, Mg I have reviewed the last note from staff over past 24 hours I have discussed pt's care plan and test results with nursing staff, case manager   LOS: 4 days   Dwyane Dee, MD [facility] 03/04/2022, 5:17 PM

## 2022-03-04 NOTE — Plan of Care (Signed)

## 2022-03-04 NOTE — Progress Notes (Signed)
Mobility Specialist Progress Note:   03/04/22 1400  Mobility  Activity Ambulated with assistance to bathroom  Level of Assistance Contact guard assist, steadying assist  Assistive Device Other (Comment) (IV Pole)  Distance Ambulated (ft) 15 ft  Activity Response Tolerated well  $Mobility charge 1 Mobility   Pt in BR, successful BM. Pt not requiring any physical assist to ambulate around room. Pt back in bed with bed alarm on.  [facility]elta Numbers [facility] Secure Chat or Office Phone: [phone]

## 2022-03-05 ENCOUNTER — Inpatient Hospital Stay (HOSPITAL_COMMUNITY): Payer: Medicare Other

## 2022-03-05 DIAGNOSIS — I96 Gangrene, not elsewhere classified: Secondary | ICD-10-CM | POA: Insufficient documentation

## 2022-03-05 DIAGNOSIS — R1084 Generalized abdominal pain: Secondary | ICD-10-CM | POA: Diagnosis not present

## 2022-03-05 DIAGNOSIS — K668 Other specified disorders of peritoneum: Secondary | ICD-10-CM | POA: Diagnosis not present

## 2022-03-05 LAB — CULTURE, BODY FLUID W GRAM STAIN -BOTTLE: Culture: NO GROWTH

## 2022-03-05 NOTE — Progress Notes (Signed)
Progress Note    Logan Chambers   [id number]  DOB: 1950/10/13  DOA: 02/27/2022     5 PCP: Velna Hatchet, MD  Initial CC: abdominal pain  Hospital Course: [name] is a 71 yo male with PMH ongoing alcohol abuse, HTN, arthritis who presented with epigastric / lower chest pain and some intermittent abdominal pain.  He also reported that the abdominal pain has been present intermittently for the past 2 to 3 years. Notable labs on admission include elevated lipase, 451. CT abdomen/pelvis showed abdominal ascites partly loculated.  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  There was also a mention of ill-defined hypodense region involving the uncinate process of the pancreas raising concern for a pancreatic mass versus focal pancreatitis. He was admitted and underwent paracentesis for diagnostic and therapeutic purposes.  1 L of serosanguineous fluid was removed and sent for further testing.  GI was also consulted on admission for further guidance.  Interval History:  No events overnight.  Today he was actually complaining of pain in his left hand fifth digit.  He had a nonhealing blood blister from slamming it in the door about 3 weeks ago.  Appearance now looks to be dry gangrene.  Hand surgery consulted. Abdominal pain is about the same or slightly better. Reviewed cytology being negative with him and plan now for seeking omental biopsy.  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 -He reports approximately 5 to 6 pound weight loss over the past 1 month with associated decreased appetite - lipase elevated but abdominal exam not consistently TTP and continues to tolerate diet -  AFP, CEA, CA 19-9 all negative/normal  - s/p paracentesis on 7/6; cytology has  returned negative - the PMNs are 220, therefore will hold off on abx at this time  Mass of omentum - CT A/P performed on admission shows area of "omental nodularity and suspected soft tissue mass concerning for peritoneal carcinomatosis".  There also appears to be ill-defined hypodense region along the uncinate process of the pancreas -Evaluated by GI and remains available in the background if needed further - Cytology negative from paracentesis fluid.  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  Blood blister has not healed and patient now endorsing worsening pain -Unfortunately, appears to be dry gangrene at this time.  Evaluated by hand surgery with recommendations for probable amputation with outpatient follow-up with Dr. Greta Doom  Alcohol use - history of ~18 pack of beer daily now he reports 2 [place] daily - currently no s/s of withdrawal but at risk for this; denies tremors between drinks  -Has not developed any signs of withdrawal over the past approximate week   Constipation - Has been intermittently refusing laxatives.  Continue offering  Normocytic anemia - MCV is borderline macrocytic.  Folate and B12 are normal  Hyponatremia - Mild.  Etiology suspected in setting of underlying alcohol abuse -Continue LR - BMP in a.m.  Tobacco use - Patient endorses smoking about 1 PPD for 25 to 30 years - Place nicotine patch  Toe injury No fracture on X ray.   Old records reviewed in assessment of this patient  Antimicrobials:   DVT prophylaxis:  enoxaparin (LOVENOX)  injection 40 mg Start: 02/28/22 1400   Code Status:   Code Status: Full Code  Mobility Assessment (last 72 hours)     Mobility Assessment     Row Name 03/04/22 07:36:20 03/03/22 1906 03/03/22 0830 03/02/22 1928     Does patient have an order for bedrest or is patient  medically unstable No - Continue assessment No - Continue assessment No - Continue assessment No - Continue assessment    What is the highest level of mobility based on the progressive mobility assessment? Level 5 (Walks with assist in room/hall) - Balance while stepping forward/back and can walk in room with assist - Complete Level 5 (Walks with assist in room/hall) - Balance while stepping forward/back and can walk in room with assist - Complete Level 5 (Walks with assist in room/hall) - Balance while stepping forward/back and can walk in room with assist - Complete Level 5 (Walks with assist in room/hall) - Balance while stepping forward/back and can walk in room with assist - Complete             Disposition Plan: Home in 1 to 2 days Status is: Inpatient  Objective: Blood pressure (!) 138/93, pulse 86, temperature 98.3 F (36.8 C), temperature source Oral, resp. rate 18, SpO2 98 %.  Examination:  Physical Exam Constitutional:      General: He is not in acute distress.    Appearance: Normal appearance.  HENT:     Head: Normocephalic and atraumatic.     Mouth/Throat:     Mouth: Mucous membranes are moist.  Eyes:     Extraocular Movements: Extraocular movements intact.  Cardiovascular:     Rate and Rhythm: Normal rate and regular rhythm.     Heart sounds: Normal heart sounds.  Pulmonary:     Effort: Pulmonary effort is normal. No respiratory distress.     Breath sounds: Normal breath sounds. No wheezing.  Abdominal:     General: Bowel sounds are normal. There is no distension.     Palpations: Abdomen is soft.     Comments: Nonspecific tenderness to palpation, but more notable in the lower quadrants with no R/G  Musculoskeletal:        General: Normal range of motion.     Cervical back: Normal range of motion and neck supple.  Skin:    General: Skin is warm and dry.  Neurological:     General: No focal deficit present.     Mental Status: He is alert.  Psychiatric:         Mood and Affect: Mood normal.        Behavior: Behavior normal.      Consultants:  GI Oncology Hand surgery  Procedures:  Paracentesis, 02/28/2022  Data Reviewed: No results found for this or any previous visit (from the past 24 hour(s)).   I have Reviewed nursing notes, Vitals, and Lab results since pt's last encounter. Pertinent lab results : see above I have ordered test including BMP, CBC, Mg I have reviewed the last note from staff over past 24 hours I have discussed pt's care plan and test results with nursing staff, case manager   LOS: 5 days   Dwyane Dee, MD [facility] 03/05/2022, 1:20 PM

## 2022-03-05 NOTE — Assessment & Plan Note (Addendum)
-   Patient slammed finger (left hand 5th digit) in door approximately 3 weeks prior to admission.  Blood blister has not healed and patient now endorsing worsening pain -Unfortunately, appears to be dry gangrene at this time.  Evaluated by hand surgery with recommendations for probable amputation with outpatient follow-up with Dr. Greta Doom

## 2022-03-05 NOTE — Consult Note (Signed)
Chief Complaint: Patient was seen in consultation today for  Chief Complaint  Patient presents with   Chest Pain    Referring Physician(s): Dr. Sabino Gasser  Supervising Physician: Corrie Mckusick  Patient Status: [facility] - In-pt  History of Present Illness: Logan Chambers is a 71 y.o. male with a medical history significant for alcohol abuse and HTN. He presented to the [facility] ED 02/27/22 with complaints of epigastric/lower chest/abdominal pain. He stated he had intermittent abdominal pain for the past 2-3 years. On admission his lipase was elevated at 451 and CT abdomen/pelvis showed findings concerning for peritoneal carcinomatosis.   CT abdomen/pelvis Other: There is moderate fluid throughout the abdomen and pelvis, which may be partially loculated. Dependently within the right pericolic gutter is a 5.3 x 3.4 by 7.3 cm hyperdense structure neck could reflect peritoneal mass. Subtle nodularity is seen within the omentum along the transverse mesocolon. Underlying peritoneal carcinomatosis cannot be excluded. Correlation with paracentesis and fluid analysis may be useful. IMPRESSION: 1. Moderate fluid throughout the abdomen and pelvis, which may be partially loculated. Areas of omental nodularity and suspected soft tissue mass in the right para colic gutter concerning for peritoneal carcinomatosis. Correlation with paracentesis and fluid analysis is recommended. 2. Ill-defined hypodense region within the uncinate process of the pancreas, favor pancreatic mass over focal pancreatitis. Please correlate with clinical presentation and laboratory analysis. 3. Trace right pleural effusion. 4.  Aortic Atherosclerosis (ICD10-I70.0).    A diagnostic and therapeutic paracentesis was performed in IR on *** and the cytology was negative for malignant cells. GI was consulted and recommended IR biopsy for further work up.   Interventional Radiology has been asked to evaluate this patient for  an image-guided omental mass biopsy. Imaging reviewed and procedure approved by Dr. Earleen Newport.   Past Medical History:  Diagnosis Date   Arm fracture, left    Arthritis    Hypertension     Past Surgical History:  Procedure Laterality Date   IR PARACENTESIS  02/28/2022    Allergies: Patient has no known allergies.  Medications: Prior to Admission medications   Medication Sig Start Date End Date Taking? Authorizing Provider  famotidine (PEPCID) 20 MG tablet Take 20 mg by mouth 2 (two) times daily as needed (For itching per patient and phamracy). 12/21/21  Yes [provider]     No family history on file.  Social History   Socioeconomic History   Marital status: Single    Spouse name: Not on file   Number of children: Not on file   Years of education: Not on file   Highest education level: Not on file  Occupational History   Not on file  Tobacco Use   Smoking status: Every Day    Types: Cigarettes   Smokeless tobacco: Never  Substance and Sexual Activity   Alcohol use: Yes    Comment: 2 40 oz per day    Drug use: No   Sexual activity: Not on file  Other Topics Concern   Not on file  Social History Narrative   Not on file   Social Determinants of Health   Financial Resource Strain: Not on file  Food Insecurity: Not on file  Transportation Needs: Not on file  Physical Activity: Not on file  Stress: Not on file  Social Connections: Not on file    Review of Systems: A 12 point ROS discussed and pertinent positives are indicated in the HPI above.  All other systems are negative.  Review of  Systems  Vital Signs: BP (!) 138/93 (BP Location: Left Arm)   Pulse 86   Temp 98.3 F (36.8 C) (Oral)   Resp 18   SpO2 98%   Physical Exam  Imaging: DG Finger Little Left  Result Date: 03/05/2022 CLINICAL DATA:  Distal left little finger injury 3 weeks ago. EXAM: LEFT LITTLE FINGER 2+V COMPARISON:  Left little finger x-rays dated February 16, 2022. FINDINGS: There  is no evidence of fracture or dislocation. There is no evidence of arthropathy or other focal bone abnormality. Osteopenia. Soft tissues are unremarkable. IMPRESSION: Negative. Electronically Signed   By: Titus Dubin M.D.   On: 03/05/2022 12:56   IR Paracentesis  Result Date: 02/28/2022 INDICATION: New onset ascites. Request for diagnostic and therapeutic paracentesis. EXAM: ULTRASOUND GUIDED RIGHT LOWER QUADRANT PARACENTESIS MEDICATIONS: 1% plain lidocaine, 3 mL COMPLICATIONS: None immediate. PROCEDURE: Informed written consent was obtained from the patient after a discussion of the risks, benefits and alternatives to treatment. A timeout was performed prior to the initiation of the procedure. Initial ultrasound scanning demonstrates a large amount of ascites within the right lower abdominal quadrant. The right lower abdomen was prepped and draped in the usual sterile fashion. 1% lidocaine was used for local anesthesia. Following this, a 19 gauge, 7-cm, Yueh catheter was introduced. An ultrasound image was saved for documentation purposes. The paracentesis was performed. The catheter was removed and a dressing was applied. The patient tolerated the procedure well without immediate post procedural complication. FINDINGS: A total of approximately 1 L of serosanguineous fluid was removed. Samples were sent to the laboratory as requested by the clinical team. IMPRESSION: Successful ultrasound-guided paracentesis yielding 1 liters of peritoneal fluid. Read by: Ascencion Dike PA-C Electronically Signed   By: Miachel Roux M.D.   On: 02/28/2022 10:51   CT Abdomen Pelvis W Contrast  Result Date: 02/27/2022 CLINICAL DATA:  Chest pain, shortness of breath for 2 days, decreased appetite, intermittent diarrhea EXAM: CT ABDOMEN AND PELVIS WITH CONTRAST TECHNIQUE: Multidetector CT imaging of the abdomen and pelvis was performed using the standard protocol following bolus administration of intravenous contrast. RADIATION  DOSE REDUCTION: This exam was performed according to the departmental dose-optimization program which includes automated exposure control, adjustment of the mA and/or kV according to patient size and/or use of iterative reconstruction technique. CONTRAST:  110m OMNIPAQUE IOHEXOL 300 MG/ML  SOLN COMPARISON:  None Available. FINDINGS: Lower chest: Trace right pleural effusion. No acute airspace disease. Hepatobiliary: No focal liver abnormality is seen. No gallstones, gallbladder wall thickening, or biliary dilatation. Pancreas: Ill-defined hypodense masslike area within the uncinate process of the pancreas measures 2.1 x 2.4 cm reference image 36/3. While this could reflect focal pancreatitis, underlying pancreatic mass cannot be excluded. Please correlate with clinical presentation. Moderate atrophy of the pancreatic body and tail. Spleen: Normal in size without focal abnormality. Adrenals/Urinary Tract: The kidneys enhance normally and symmetrically. The adrenals and bladder are unremarkable. Stomach/Bowel: No bowel obstruction or ileus. Decompressed appendix is seen within the right lower quadrant. No bowel wall thickening or inflammatory change. Vascular/Lymphatic: Diffuse atherosclerosis of the aorta and its branches. No pathologic adenopathy. Reproductive: Prostate is unremarkable. Other: There is moderate fluid throughout the abdomen and pelvis, which may be partially loculated. Dependently within the right pericolic gutter is a 5.3 x 3.4 by 7.3 cm hyperdense structure neck could reflect peritoneal mass. Subtle nodularity is seen within the omentum along the transverse mesocolon. Underlying peritoneal carcinomatosis cannot be excluded. Correlation with paracentesis and fluid analysis may  be useful. No free intraperitoneal gas.  No abdominal wall hernia. Musculoskeletal: No acute or destructive bony lesions. Reconstructed images demonstrate no additional findings. IMPRESSION: 1. Moderate fluid throughout the  abdomen and pelvis, which may be partially loculated. Areas of omental nodularity and suspected soft tissue mass in the right para colic gutter concerning for peritoneal carcinomatosis. Correlation with paracentesis and fluid analysis is recommended. 2. Ill-defined hypodense region within the uncinate process of the pancreas, favor pancreatic mass over focal pancreatitis. Please correlate with clinical presentation and laboratory analysis. 3. Trace right pleural effusion. 4.  Aortic Atherosclerosis (ICD10-I70.0). Electronically Signed   By: Randa Ngo M.D.   On: 02/27/2022 20:25   DG Foot Complete Right  Result Date: 02/27/2022 CLINICAL DATA:  Pain, hip foot 2 weeks ago. EXAM: RIGHT FOOT COMPLETE - 3+ VIEW COMPARISON:  RIGHT foot evaluation from October 09, 2018. FINDINGS: Osteopenia. Soft tissues are unremarkable. No signs of fracture or of dislocation. Midfoot degenerative changes. IMPRESSION: Midfoot degenerative changes. No acute fracture or dislocation. Electronically Signed   By: Zetta Bills M.D.   On: 02/27/2022 17:04   DG Chest 2 View  Result Date: 02/27/2022 CLINICAL DATA:  Chest pain. EXAM: CHEST - 2 VIEW COMPARISON:  Chest x-ray October 10, 2009. FINDINGS: Streaky bibasilar opacities. No confluent consolidation. No visible pleural effusions or pneumothorax. Biapical pleuroparenchymal scarring. Cardiomediastinal silhouette is within normal limits. IMPRESSION: Streaky bibasilar opacities, which could represent atelectasis or atypical infection. Electronically Signed   By: Margaretha Sheffield M.D.   On: 02/27/2022 12:53   DG Finger Little Left  Result Date: 02/16/2022 CLINICAL DATA:  Trauma.  Closed finger in door. EXAM: LEFT LITTLE FINGER 2+V COMPARISON:  None Available. FINDINGS: The soft tissues are unremarkable. No radiopaque foreign bodies. The bones appear osteopenic. No fracture or dislocation identified. IMPRESSION: 1. No acute findings. 2. Osteopenia. Electronically Signed   By:  Kerby Moors M.D.   On: 02/16/2022 14:06    Labs:  CBC: Recent Labs    02/28/22 0432 03/02/22 0115 03/03/22 0059 03/04/22 0119  WBC 6.8 4.9 4.3 5.3  HGB 12.3* 11.2* 9.3* 7.9*  HCT 34.8* 31.6* 25.7* 22.0*  PLT 192 219 222 234    COAGS: No results for input(s): "INR", "APTT" in the last 8760 hours.  BMP: Recent Labs    02/28/22 0432 03/02/22 0115 03/03/22 0059 03/04/22 0119  NA 130* 130* 130* 133*  K 4.5 4.7 3.6 4.1  CL 96* 99 97* 102  CO2 '23 22 24 24  '$ GLUCOSE 113* 91 87 83  BUN 7* '8 10 9  '$ CALCIUM 8.2* 7.8* 7.6* 7.6*  CREATININE 0.60* 0.66 0.72 0.56*  GFRNONAA >60 >60 >60 >60    LIVER FUNCTION TESTS: Recent Labs    02/28/22 0432 03/02/22 0115 03/03/22 0059 03/04/22 0119  BILITOT 0.8 1.2 0.9 0.8  AST '24 31 24 24  '$ ALT '14 13 14 14  '$ ALKPHOS 128* 90 83 74  PROT 5.8* 5.5* 4.9* 4.7*  ALBUMIN 2.5* 2.1* 1.8* 1.8*    TUMOR MARKERS: No results for input(s): "AFPTM", "CEA", "CA199", "CHROMGRNA" in the last 8760 hours.  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   Risks and benefits of this procedure were discussed with the patient and/or patient's family including, but not limited to bleeding, infection, damage to adjacent structures or low yield requiring additional tests.  All of the questions were answered and there  is agreement to proceed. He has been NPO. Labs and vitals have been reviewed and are within acceptable limits. Last dose of lovenox was 03/05/21 at 1400.   Consent signed and in chart.  Thank you for this interesting consult.  I greatly enjoyed meeting Greogry Goodwyn and look forward to participating in their care.  A copy of this report was sent to the requesting provider on this date.  Electronically Signed: Soyla Dryer, AGACNP-BC [phone] 03/05/2022, 3:22 PM   I spent a total of 20  Minutes    in face to face in clinical consultation, greater than 50% of which was counseling/coordinating care for omental mass biopsy.

## 2022-03-05 NOTE — Consult Note (Signed)
Reason for Consult:Left little finger lesion Referring Physician: Dwyane Dee Time called: [id number] Time at bedside: [place] is an 71 y.o. male.  HPI: Logan Chambers shut his left little finger in door 3w ago. He developed a large blood blister that hasn't healed. He was admitted 5d ago with pancreatitis. He's still having pain in the finger. Hand surgery was consulted today for evaluation. He is RHD.  Past Medical History:  Diagnosis Date   Arm fracture, left    Arthritis    Hypertension     Past Surgical History:  Procedure Laterality Date   IR PARACENTESIS  02/28/2022    No family history on file.  Social History:  reports that he has been smoking cigarettes. He has never used smokeless tobacco. He reports current alcohol use. He reports that he does not use drugs.  Allergies: No Known Allergies  Medications: I have reviewed the patient's current medications.  Results for orders placed or performed during the hospital encounter of 02/27/22 (from the past 48 hour(s))  Comprehensive metabolic panel     Status: Abnormal   Collection Time: 03/04/22  1:19 AM  Result Value Ref Range   Sodium 133 (L) 135 - 145 mmol/L   Potassium 4.1 3.5 - 5.1 mmol/L   Chloride 102 98 - 111 mmol/L   CO2 24 22 - 32 mmol/L   Glucose, Bld 83 70 - 99 mg/dL    Comment: Glucose reference range applies only to samples taken after fasting for at least 8 hours.   BUN 9 8 - 23 mg/dL   Creatinine, Ser 0.56 (L) 0.61 - 1.24 mg/dL   Calcium 7.6 (L) 8.9 - 10.3 mg/dL   Total Protein 4.7 (L) 6.5 - 8.1 g/dL   Albumin 1.8 (L) 3.5 - 5.0 g/dL   AST 24 15 - 41 U/L   ALT 14 0 - 44 U/L   Alkaline Phosphatase 74 38 - 126 U/L   Total Bilirubin 0.8 0.3 - 1.2 mg/dL   GFR, Estimated >60 >60 mL/min    Comment: (NOTE) Calculated using the CKD-EPI Creatinine Equation (2021)    Anion gap 7 5 - 15    Comment: Performed at [facility], [place] [address] [place], [place] [address]  CBC with Differential/Platelet      Status: Abnormal   Collection Time: 03/04/22  1:19 AM  Result Value Ref Range   WBC 5.3 4.0 - 10.5 K/uL   RBC 2.25 (L) 4.22 - 5.81 MIL/uL   Hemoglobin 7.9 (L) 13.0 - 17.0 g/dL   HCT 22.0 (L) 39.0 - 52.0 %   MCV 97.8 80.0 - 100.0 fL   MCH 35.1 (H) 26.0 - 34.0 pg   MCHC 35.9 30.0 - 36.0 g/dL   RDW 11.9 11.5 - 15.5 %   Platelets 234 150 - 400 K/uL   nRBC 0.0 0.0 - 0.2 %   Neutrophils Relative % 65 %   Neutro Abs 3.4 1.7 - 7.7 K/uL   Lymphocytes Relative 20 %   Lymphs Abs 1.0 0.7 - 4.0 K/uL   Monocytes Relative 13 %   Monocytes Absolute 0.7 0.1 - 1.0 K/uL   Eosinophils Relative 2 %   Eosinophils Absolute 0.1 0.0 - 0.5 K/uL   Basophils Relative 0 %   Basophils Absolute 0.0 0.0 - 0.1 K/uL   Immature Granulocytes 0 %   Abs Immature Granulocytes 0.02 0.00 - 0.07 K/uL    Comment: Performed at [facility]  [address] [place] [address]  Magnesium     Status: None   Collection Time: 03/04/22  1:19 AM  Result Value Ref Range   Magnesium 1.9 1.7 - 2.4 mg/dL    Comment: Performed at [facility] [address], [place], [place] [address]    No results found.  Review of Systems  HENT:  Negative for ear discharge, ear pain, hearing loss and tinnitus.   Eyes:  Negative for photophobia and pain.  Respiratory:  Negative for cough and shortness of breath.   Cardiovascular:  Negative for chest pain.  Gastrointestinal:  Negative for abdominal pain, nausea and vomiting.  Genitourinary:  Negative for dysuria, flank pain, frequency and urgency.  Musculoskeletal:  Positive for arthralgias (Left little finger). Negative for back pain, myalgias and neck pain.  Neurological:  Negative for dizziness and headaches.  Hematological:  Does not bruise/bleed easily.  Psychiatric/Behavioral:  The patient is not nervous/anxious.    Blood pressure (!) 138/93, pulse 86, temperature 98.3 F (36.8 C), temperature source Oral, resp. rate 18, SpO2 98 %. Physical  Exam Constitutional:      General: He is not in acute distress.    Appearance: He is well-developed. He is not diaphoretic.  HENT:     Head: Normocephalic and atraumatic.  Eyes:     General: No scleral icterus.       Right eye: No discharge.        Left eye: No discharge.     Conjunctiva/sclera: Conjunctivae normal.  Cardiovascular:     Rate and Rhythm: Normal rate and regular rhythm.  Pulmonary:     Effort: Pulmonary effort is normal. No respiratory distress.  Musculoskeletal:     Cervical back: Normal range of motion.     Comments: Left shoulder, elbow, wrist, digits- Little finger with dry gangrene P3, mild TTP P2/P3, no instability, no blocks to motion  Sens  Ax/R/M/U intact  Mot   Ax/ R/ PIN/ M/ AIN/ U intact  Rad 2+  Skin:    General: Skin is warm and dry.  Neurological:     Mental Status: He is alert.  Psychiatric:        Mood and Affect: Mood normal.        Behavior: Behavior normal.     Assessment/Plan: Left little finger gangrene -- Will need amputation but this is not an acute problem. He should f/u with Dr. Greta Doom in the office upon discharge to discuss options. Will get x-ray to assess any bony involvement.    Lisette Abu, PA-C Orthopedic Surgery [phone] 03/05/2022, 11:16 AM

## 2022-03-06 ENCOUNTER — Inpatient Hospital Stay (HOSPITAL_COMMUNITY): Payer: Medicare Other

## 2022-03-06 DIAGNOSIS — R1084 Generalized abdominal pain: Secondary | ICD-10-CM | POA: Diagnosis not present

## 2022-03-06 LAB — CBC
HCT: 21.3 % — ABNORMAL LOW (ref 39.0–52.0)
Hemoglobin: 7.6 g/dL — ABNORMAL LOW (ref 13.0–17.0)
MCH: 34.5 pg — ABNORMAL HIGH (ref 26.0–34.0)
MCHC: 35.7 g/dL (ref 30.0–36.0)
MCV: 96.8 fL (ref 80.0–100.0)
Platelets: 276 10*3/uL (ref 150–400)
RBC: 2.2 MIL/uL — ABNORMAL LOW (ref 4.22–5.81)
RDW: 11.7 % (ref 11.5–15.5)
WBC: 5.3 10*3/uL (ref 4.0–10.5)
nRBC: 0 % (ref 0.0–0.2)

## 2022-03-06 LAB — PROTIME-INR
INR: 1.1 (ref 0.8–1.2)
Prothrombin Time: 14.5 seconds (ref 11.4–15.2)

## 2022-03-06 MED ORDER — MIDAZOLAM HCL 2 MG/2ML IJ SOLN
INTRAMUSCULAR | Status: AC
  Filled 2022-03-06: qty 2

## 2022-03-06 MED ORDER — LACTATED RINGERS IV SOLN: INTRAVENOUS | Status: AC

## 2022-03-06 MED ORDER — FENTANYL CITRATE (PF) 100 MCG/2ML IJ SOLN
INTRAMUSCULAR | Status: AC
  Filled 2022-03-06: qty 2

## 2022-03-06 MED ORDER — LIDOCAINE HCL 1 % IJ SOLN
INTRAMUSCULAR | Status: AC
  Filled 2022-03-06: qty 10

## 2022-03-06 NOTE — Progress Notes (Signed)
  Progress Note Patient: Logan Chambers TIR:[id number] DOB: 08/01/1951 DOA: 02/27/2022  DOS: the patient was seen and examined on 03/06/2022  Brief hospital course: Strict/Mr. Stettler is a 71 yo male with PMH ongoing alcohol abuse, HTN, arthritis who presented with epigastric / lower chest pain and some intermittent abdominal pain.  He also reported that the abdominal pain has been present intermittently for the past 2 to 3 years. Notable labs on admission include elevated lipase, 451. CT abdomen/pelvis showed abdominal ascites partly loculated.  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  There was also a mention of ill-defined hypodense region involving the uncinate process of the pancreas raising concern for a pancreatic mass versus focal pancreatitis. He was admitted and underwent paracentesis for diagnostic and therapeutic purposes.  1 L of serosanguineous fluid was removed and sent for further testing.  GI was also consulted on admission for further guidance. Assessment and Plan: Omental mass. Abdominal pain. Present to the hospital with complaints of abdominal pain as well as nausea. 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 Paracentesis on 7/6, cytology was negative. IR were consulted for omental biopsy although reportedly procedure was canceled. Currently the CT result is still pending.  Will await further recommendation. Abdominal pain already improving.  Left hand fifth digit gangrene. Dry gangrene. Orthopedic consulted, outpatient amputation recommended.  Alcohol use. Drinks 18 pack of beer a day. Out of the window.  Monitor.  Constipation. Continue current regimen. Normocytic anemia. Work-up so far normal.  Monitor.  Active smoker. Currently on  nicotine patch.  Hyponatremia. From poor p.o. intake. Treated with fluids.  Subjective: No nausea no vomiting.  Abdominal pain improving.  No fever no chills.  Physical Exam: Vitals:   03/06/22 0609 03/06/22 0815 03/06/22 1230 03/06/22 1545  BP: (!) 152/87 (!) 142/91 (!) 165/99 (!) 160/98  Pulse: 89 90 92 91  Resp: '17 16 16 17  '$ Temp: 98.1 F (36.7 C) 98.9 F (37.2 C)  97.8 F (36.6 C)  TempSrc:  Oral  Oral  SpO2: 99% 100% 100% 100%   General: Appear in mild distress; no visible Abnormal Neck Mass Or lumps, Conjunctiva normal Cardiovascular: S1 and S2 Present, no Murmur, Respiratory: good respiratory effort, Bilateral Air entry present and CTA, no Crackles, no wheezes Abdomen: Bowel Sound present, Non tender  Extremities: no Pedal edema Neurology: alert and oriented to time, place, and person  Gait not checked due to patient safety concerns   Data Reviewed: I have Reviewed nursing notes, Vitals, and Lab results since pt's last encounter. Pertinent lab results CBC and BMP I have ordered test including CBC and BMP    Family Communication: None at bedside  Disposition: Status is: Inpatient Remains inpatient appropriate because: Poor p.o. intact.  Labs are pending  Patient author: Berle Mull, MD 03/06/2022 6:34 PM  Please look on [email or web address] to find out who is on call.

## 2022-03-06 NOTE — Progress Notes (Signed)
Mobility Specialist Progress Note:   03/06/22 0930  Mobility  Activity Ambulated with assistance in hallway  Level of Assistance Contact guard assist, steadying assist  Assistive Device Cane  Distance Ambulated (ft) 250 ft  Activity Response Tolerated well  $Mobility charge 1 Mobility   Pt agreeable to mobility session after max encouragement. Required contact guard assist for steadying. No c/o throughout. Pt left in BR, will f/u to return to bed.   Logan Chambers Acute Rehab Secure Chat or Office Phone: [phone]

## 2022-03-06 NOTE — Progress Notes (Signed)
Mobility Specialist Progress Note:   03/06/22 0950  Mobility  Activity Ambulated with assistance to bathroom  Level of Assistance Contact guard assist, steadying assist  Assistive Device None  Distance Ambulated (ft) 15 ft  Activity Response Tolerated well  $Mobility charge 1 Mobility   Pt ambulated back from BR with no AD. Back in bed with all needs met.   Nelta Numbers Acute Rehab Secure Chat or Office Phone: [phone]

## 2022-03-06 NOTE — Sedation Documentation (Signed)
Dr Laurence Ferrari reviewed initial scans and there was no mass to biopsy. He talked to the patient about this. Procedure cancelled.

## 2022-03-06 NOTE — Sedation Documentation (Signed)
Patient assisted to bathroom to have a BM and then was transferred to 6N with transporter. Left message for his nurse that no procedure was done and for patient nurse (who stepped off the unit to call nurses station so I can report that to her.

## 2022-03-06 NOTE — Care Management Important Message (Signed)
Important Message  Patient Details  Name: Logan Chambers MRN: [id number] Date of Birth: 1951-07-31   Medicare Important Message Given:  Yes     Enolia Koepke Montine Circle 03/06/2022, 3:53 PM

## 2022-03-07 LAB — COMPREHENSIVE METABOLIC PANEL
ALT: 15 U/L (ref 0–44)
AST: 21 U/L (ref 15–41)
Albumin: 1.9 g/dL — ABNORMAL LOW (ref 3.5–5.0)
Alkaline Phosphatase: 74 U/L (ref 38–126)
Anion gap: 10 (ref 5–15)
BUN: <5 mg/dL — ABNORMAL LOW (ref 8–23)
CO2: 22 mmol/L (ref 22–32)
Calcium: 7.8 mg/dL — ABNORMAL LOW (ref 8.9–10.3)
Chloride: 99 mmol/L (ref 98–111)
Creatinine, Ser: 0.54 mg/dL — ABNORMAL LOW (ref 0.61–1.24)
GFR, Estimated: >60 mL/min (ref >60)
Glucose, Bld: 75 mg/dL (ref 70–99)
Potassium: 3.5 mmol/L (ref 3.5–5.1)
Sodium: 131 mmol/L — ABNORMAL LOW (ref 135–145)
Total Bilirubin: 1.1 mg/dL (ref 0.3–1.2)
Total Protein: 5.4 g/dL — ABNORMAL LOW (ref 6.5–8.1)

## 2022-03-07 LAB — CBC
HCT: 22.5 % — ABNORMAL LOW (ref 39.0–52.0)
Hemoglobin: 8.1 g/dL — ABNORMAL LOW (ref 13.0–17.0)
MCH: 34.6 pg — ABNORMAL HIGH (ref 26.0–34.0)
MCHC: 36 g/dL (ref 30.0–36.0)
MCV: 96.2 fL (ref 80.0–100.0)
Platelets: 301 10*3/uL (ref 150–400)
RBC: 2.34 MIL/uL — ABNORMAL LOW (ref 4.22–5.81)
RDW: 11.7 % (ref 11.5–15.5)
WBC: 6.3 10*3/uL (ref 4.0–10.5)
nRBC: 0 % (ref 0.0–0.2)

## 2022-03-07 LAB — LIPASE, BLOOD: Lipase: 393 U/L — ABNORMAL HIGH (ref 11–51)

## 2022-03-07 LAB — MAGNESIUM: Magnesium: 1.9 mg/dL (ref 1.7–2.4)

## 2022-03-07 MED ORDER — PANTOPRAZOLE SODIUM 40 MG PO TBEC: 40.0000 mg | DELAYED_RELEASE_TABLET | Freq: Every day | ORAL | 1 refills | Status: AC

## 2022-03-07 MED ORDER — POLYETHYLENE GLYCOL 3350 17 G PO PACK: 17.0000 g | PACK | Freq: Every day | ORAL | 0 refills | Status: DC

## 2022-03-07 MED ORDER — NICOTINE 21 MG/24HR TD PT24: 21.0000 mg | MEDICATED_PATCH | Freq: Every day | TRANSDERMAL | 0 refills | Status: DC

## 2022-03-07 NOTE — TOC Initial Note (Signed)
Transition of Care [facility]) - Initial/Assessment Note    Patient Details  Name: Logan Chambers MRN: [id number] Date of Birth: 09-Jun-1951  Transition of Care [facility] CM/SW Contact:    Tresa Endo Phone Number: 03/07/2022, 10:12 AM  Clinical Narrative:                 CSW attempted a achohol screening, pt refused and refused resources.         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] Carcinomatosis ([place]) [C80.0] Abdominal pain [R10.9] Patient Active Problem List   Diagnosis Date Noted   Gangrene of left hand 5th digit ([place]) 03/05/2022   Constipation 03/03/2022   Mass of omentum 03/01/2022   Tobacco use 02/28/2022   Hyponatremia 02/28/2022   Normocytic anemia 02/28/2022   Abdominal pain 02/27/2022   Toe injury 02/27/2022   Alcohol use 02/27/2022   Plantar fasciitis 10/09/2018   PCP:  Velna Hatchet, MD Pharmacy:   [id number] [place] [place]Phone: [phone]: [phone]    Social Determinants of Health (SDOH) Interventions    Readmission Risk Interventions     No data to display

## 2022-03-07 NOTE — Progress Notes (Signed)
Mobility Specialist Progress Note:   03/07/22 1130  Mobility  Activity Transferred to/from [facility]  Level of Assistance Standby assist, set-up cues, supervision of patient - no hands on  Assistive Device None  Distance Ambulated (ft) 2 ft  Activity Response Tolerated well  $Mobility charge 1 Mobility   Pt declining any ambulation this am d/t d/c soon. Needing to have BM, transferred to [facility] with no physical assistance. Pt left with all needs met.   Nelta Numbers Acute Rehab Secure Chat or Office Phone: [phone]

## 2022-03-07 NOTE — Progress Notes (Signed)
CSW attempted to speak with pt about substance use and resources. Pt stated that he does not think he has a drinking problem and does not need resource. When CSW asked pt about how much he drank pt replied maybe a 12-16 pack while watching a sports game. Pt was pleasant but states he is ready to DC to go home and have a drink.

## 2022-03-07 NOTE — Plan of Care (Signed)
  Problem: Education: Goal: Knowledge of General Education information will improve Description: Including pain rating scale, medication(s)/side effects and non-pharmacologic comfort measures Outcome: Completed/Met   Problem: Health Behavior/Discharge Planning: Goal: Ability to manage health-related needs will improve Outcome: Completed/Met   Problem: Clinical Measurements: Goal: Will remain free from infection Outcome: Completed/Met   Problem: Clinical Measurements: Goal: Will remain free from infection Outcome: Completed/Met   Problem: Clinical Measurements: Goal: Ability to maintain clinical measurements within normal limits will improve Outcome: Completed/Met Goal: Will remain free from infection Outcome: Completed/Met Goal: Diagnostic test results will improve Outcome: Completed/Met Goal: Respiratory complications will improve Outcome: Completed/Met Goal: Cardiovascular complication will be avoided Outcome: Completed/Met

## 2022-03-11 NOTE — Discharge Summary (Signed)
Physician Discharge Summary   Patient: Logan Chambers MRN: [id number] DOB: February 13, 1951  Admit date:     02/27/2022  Discharge date: 03/07/2022  Discharge Physician: Berle Mull  PCP: Velna Hatchet, MD  Recommendations at discharge: Follow-up with PCP in 1 week. Patient will require repeat CT scan/MRI to evaluate pancreatic process in 4 to 6 weeks. Patient will also require right upper quadrant ultrasound to understand the liver pathology.  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 [name] is a 71 yo male with PMH ongoing alcohol abuse, HTN, arthritis who presented with epigastric / lower chest pain and some intermittent abdominal pain.  He also reported that the abdominal pain has been present intermittently for the past 2 to 3 years. Notable labs on admission include elevated lipase, 451. CT abdomen/pelvis showed abdominal ascites partly loculated.  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  There was also a mention of ill-defined hypodense region involving the uncinate process of the pancreas raising concern for a pancreatic mass versus focal pancreatitis. He was admitted and underwent paracentesis for diagnostic and therapeutic purposes.  1 L of serosanguineous fluid was removed and sent for further testing.  GI was also consulted on admission for further guidance.  Assessment and Plan: Abdominal pain.  Possible acute pancreatitis. Present to the hospital with complaints of abdominal pain as well as nausea. Work-up in the ER showed CT abdomen concerning ill-defined hypodense lesion within the uncinate process pancreatic mass or focal  pancreatitis. Lipase level was elevated. Patient's symptoms resolved with bowel rest. Suspect that his current presentation was secondary to alcohol induced acute pancreatitis which has resolved for now. Given that the pancreatic mass cannot be ruled out patient will require a repeat CT scan or MRI pancreas to further understand the pathological process in the pancreas. Would also recommend ultrasound liver to rule out any cholelithiasis. Reassuringly work-up including AFP CEA CA 19-9 are negative.  Omental mass. We will do initial CT scan there was some concern regarding omental mass. Underwent paracentesis on 7/6, cytology was negative. IR were consulted for omental biopsy although there was no omental mass seen at the time of CT-guided biopsy.  In fact the pathology was freely mobile within the right paracolic gutter per my discussion with radiology. Recommend repeat imaging down the road to ensure no ongoing process within the abdomen.  Ascites. SAAG ratio not suggestive of portal hypertension. Work-up also on the CT scan shows no evidence of liver cirrhosis so far. Patient does have history of alcohol abuse. Recommend outpatient close monitoring as well as repeat ultrasound to ensure no ongoing issues with liver.   Left hand fifth digit gangrene. Dry gangrene. Orthopedic consulted, outpatient amputation recommended.   Alcohol use. Drinks 18 pack of beer a day. Out of the window.  Monitor.  Constipation. Continue current regimen. Normocytic anemia. Work-up so far normal.  Monitor.  Active smoker. Currently on nicotine patch.  Hyponatremia. From poor p.o. intake. Treated with fluids.  Consultants: Gastroenterology IR  Procedures performed:  Ultrasound paracentesis DISCHARGE MEDICATION: Allergies as of 03/07/2022   No Known Allergies      Medication List     TAKE these medications  famotidine 20 MG tablet Commonly known as: PEPCID Take 20 mg by mouth 2 (two)  times daily as needed (For itching per patient and phamracy).   nicotine 21 mg/24hr patch Commonly known as: NICODERM CQ - dosed in mg/24 hours Place 1 patch (21 mg total) onto the skin daily.   pantoprazole 40 MG tablet Commonly known as: Protonix Take 1 tablet (40 mg total) by mouth daily.   polyethylene glycol 17 g packet Commonly known as: MIRALAX / GLYCOLAX Take 17 g by mouth daily.        Follow-up Information     Velna Hatchet, MD. Schedule an appointment as soon as possible for a visit in 1 week(s).   Specialty: Internal Medicine Why: need a [place] RUQ/Liver and CT or MRI with pancreatic protocol. Contact information: [place] [address] [phone]         Orene Desanctis, MD. Schedule an appointment as soon as possible for a visit in 2 week(s).   Specialty: Orthopedic Surgery Contact information: [address] [place] [address] [phone]         [facility]. Schedule an appointment as soon as possible for a visit in 1 month(s).   Specialty: Gastroenterology Contact information: [place] [address] [phone]               Disposition: Home Diet recommendation: Cardiac diet  Discharge Exam: Vitals:   03/06/22 2000 03/07/22 0531 03/07/22 0755 03/07/22 1400  BP: (!) 141/82 140/79 133/73   Pulse: (!) 105 85 90   Resp: '17 17 17   '$ Temp:  98.4 F (36.9 C) 98.3 F (36.8 C)   TempSrc:  Oral Oral   SpO2: 96% 100% 100% 99%   General: Appear in no distress; no visible Abnormal Neck Mass Or lumps, Conjunctiva normal Cardiovascular: S1 and S2 Present, no Murmur, Respiratory: good respiratory effort, Bilateral Air entry present and CTA, no Crackles, no wheezes Abdomen: Bowel Sound present, Non tender  Extremities: no Pedal edema Neurology: alert and oriented to time, place, and person  Gait not checked due to patient safety concerns There were no vitals filed  for this visit. Condition at discharge: stable  The results of significant diagnostics from this hospitalization (including imaging, microbiology, ancillary and laboratory) are listed below for reference.   Imaging Studies: CT Abd Limited W/O Cm  Result Date: 03/07/2022 CLINICAL DATA:  71 year old male with bloody ascites and a question of omental nodularity and right paracolic gutter peritoneal based mass. He presents for attempted CT-guided biopsy. EXAM: CT ABDOMEN WITHOUT CONTRAST LIMITED TECHNIQUE: Multidetector CT imaging of the abdomen was performed following the standard protocol without IV contrast. RADIATION DOSE REDUCTION: This exam was performed according to the departmental dose-optimization program which includes automated exposure control, adjustment of the mA and/or kV according to patient size and/or use of iterative reconstruction technique. COMPARISON:  None Available. FINDINGS: The patient was positioned RPO on the CT gantry an initial CT imaging was performed. The ascites is free-flowing and has all transitions into the left anterolateral position. The previously identified questioned masslike density in the right pericolic gutter is no longer present and likely reflected hemorrhagic debris. Reported omental nodularity is tiny and not amenable to targeting for CT guidance. There is no target for CT biopsy at this time. Extensive vascular atherosclerotic vascular calcifications. No acute abnormality identified. IMPRESSION: 1. No target for CT biopsy. 2. The questioned right pericolic gutter peritoneal implant is free-flowing and moves  with the ascites. This very likely represented layering blood products on the recent CT scan. Electronically Signed   By: Jacqulynn Cadet M.D.   On: 03/07/2022 09:29   DG Finger Little Left  Result Date: 03/05/2022 CLINICAL DATA:  Distal left little finger injury 3 weeks ago. EXAM: LEFT LITTLE FINGER 2+V COMPARISON:  Left little finger x-rays dated February 16, 2022. FINDINGS: There is no evidence of fracture or dislocation. There is no evidence of arthropathy or other focal bone abnormality. Osteopenia. Soft tissues are unremarkable. IMPRESSION: Negative. Electronically Signed   By: Titus Dubin M.D.   On: 03/05/2022 12:56   IR Paracentesis  Result Date: 02/28/2022 INDICATION: New onset ascites. Request for diagnostic and therapeutic paracentesis. EXAM: ULTRASOUND GUIDED RIGHT LOWER QUADRANT PARACENTESIS MEDICATIONS: 1% plain lidocaine, 3 mL COMPLICATIONS: None immediate. PROCEDURE: Informed written consent was obtained from the patient after a discussion of the risks, benefits and alternatives to treatment. A timeout was performed prior to the initiation of the procedure. Initial ultrasound scanning demonstrates a large amount of ascites within the right lower abdominal quadrant. The right lower abdomen was prepped and draped in the usual sterile fashion. 1% lidocaine was used for local anesthesia. Following this, a 19 gauge, 7-cm, Yueh catheter was introduced. An ultrasound image was saved for documentation purposes. The paracentesis was performed. The catheter was removed and a dressing was applied. The patient tolerated the procedure well without immediate post procedural complication. FINDINGS: A total of approximately 1 L of serosanguineous fluid was removed. Samples were sent to the laboratory as requested by the clinical team. IMPRESSION: Successful ultrasound-guided paracentesis yielding 1 liters of peritoneal fluid. Read by: Ascencion Dike PA-C Electronically Signed   By: Miachel Roux M.D.   On: 02/28/2022 10:51   CT Abdomen Pelvis W Contrast  Result Date: 02/27/2022 CLINICAL DATA:  Chest pain, shortness of breath for 2 days, decreased appetite, intermittent diarrhea EXAM: CT ABDOMEN AND PELVIS WITH CONTRAST TECHNIQUE: Multidetector CT imaging of the abdomen and pelvis was performed using the standard protocol following bolus administration of  intravenous contrast. RADIATION DOSE REDUCTION: This exam was performed according to the departmental dose-optimization program which includes automated exposure control, adjustment of the mA and/or kV according to patient size and/or use of iterative reconstruction technique. CONTRAST:  64m OMNIPAQUE IOHEXOL 300 MG/ML  SOLN COMPARISON:  None Available. FINDINGS: Lower chest: Trace right pleural effusion. No acute airspace disease. Hepatobiliary: No focal liver abnormality is seen. No gallstones, gallbladder wall thickening, or biliary dilatation. Pancreas: Ill-defined hypodense masslike area within the uncinate process of the pancreas measures 2.1 x 2.4 cm reference image 36/3. While this could reflect focal pancreatitis, underlying pancreatic mass cannot be excluded. Please correlate with clinical presentation. Moderate atrophy of the pancreatic body and tail. Spleen: Normal in size without focal abnormality. Adrenals/Urinary Tract: The kidneys enhance normally and symmetrically. The adrenals and bladder are unremarkable. Stomach/Bowel: No bowel obstruction or ileus. Decompressed appendix is seen within the right lower quadrant. No bowel wall thickening or inflammatory change. Vascular/Lymphatic: Diffuse atherosclerosis of the aorta and its branches. No pathologic adenopathy. Reproductive: Prostate is unremarkable. Other: There is moderate fluid throughout the abdomen and pelvis, which may be partially loculated. Dependently within the right pericolic gutter is a 5.3 x 3.4 by 7.3 cm hyperdense structure neck could reflect peritoneal mass. Subtle nodularity is seen within the omentum along the transverse mesocolon. Underlying peritoneal carcinomatosis cannot be excluded. Correlation with paracentesis and fluid analysis may be useful. No free intraperitoneal gas.  No abdominal wall hernia. Musculoskeletal: No acute or destructive bony lesions. Reconstructed images demonstrate no additional findings. IMPRESSION: 1.  Moderate fluid throughout the abdomen and pelvis, which may be partially loculated. Areas of omental nodularity and suspected soft tissue mass in the right para colic gutter concerning for peritoneal carcinomatosis. Correlation with paracentesis and fluid analysis is recommended. 2. Ill-defined hypodense region within the uncinate process of the pancreas, favor pancreatic mass over focal pancreatitis. Please correlate with clinical presentation and laboratory analysis. 3. Trace right pleural effusion. 4.  Aortic Atherosclerosis (ICD10-I70.0). Electronically Signed   By: Randa Ngo M.D.   On: 02/27/2022 20:25   DG Foot Complete Right  Result Date: 02/27/2022 CLINICAL DATA:  Pain, hip foot 2 weeks ago. EXAM: RIGHT FOOT COMPLETE - 3+ VIEW COMPARISON:  RIGHT foot evaluation from October 09, 2018. FINDINGS: Osteopenia. Soft tissues are unremarkable. No signs of fracture or of dislocation. Midfoot degenerative changes. IMPRESSION: Midfoot degenerative changes. No acute fracture or dislocation. Electronically Signed   By: Zetta Bills M.D.   On: 02/27/2022 17:04   DG Chest 2 View  Result Date: 02/27/2022 CLINICAL DATA:  Chest pain. EXAM: CHEST - 2 VIEW COMPARISON:  Chest x-ray October 10, 2009. FINDINGS: Streaky bibasilar opacities. No confluent consolidation. No visible pleural effusions or pneumothorax. Biapical pleuroparenchymal scarring. Cardiomediastinal silhouette is within normal limits. IMPRESSION: Streaky bibasilar opacities, which could represent atelectasis or atypical infection. Electronically Signed   By: Margaretha Sheffield M.D.   On: 02/27/2022 12:53   DG Finger Little Left  Result Date: 02/16/2022 CLINICAL DATA:  Trauma.  Closed finger in door. EXAM: LEFT LITTLE FINGER 2+V COMPARISON:  None Available. FINDINGS: The soft tissues are unremarkable. No radiopaque foreign bodies. The bones appear osteopenic. No fracture or dislocation identified. IMPRESSION: 1. No acute findings. 2. Osteopenia.  Electronically Signed   By: Kerby Moors M.D.   On: 02/16/2022 14:06    Microbiology: Results for orders placed or performed during the hospital encounter of 02/27/22  Culture, body fluid w Gram Stain-bottle     Status: None   Collection Time: 02/28/22  9:41 AM   Specimen: Peritoneal Washings  Result Value Ref Range Status   Specimen Description PERITONEAL  Final   Special Requests NONE  Final   Culture   Final    NO GROWTH 5 DAYS Performed at [facility] [address], [place], [place] [address]    Report Status 03/05/2022 FINAL  Final  Gram stain     Status: None   Collection Time: 02/28/22  9:41 AM   Specimen: Peritoneal Washings  Result Value Ref Range Status   Specimen Description PERITONEAL  Final   Special Requests NONE  Final   Gram Stain   Final    WBC PRESENT,BOTH PMN AND MONONUCLEAR NO ORGANISMS SEEN CYTOSPIN SMEAR Performed at [facility], [address], [place], [place] [address]    Report Status 02/28/2022 FINAL  Final    Labs: CBC: Recent Labs  Lab 03/06/22 0225 03/07/22 0035  WBC 5.3 6.3  HGB 7.6* 8.1*  HCT 21.3* 22.5*  MCV 96.8 96.2  PLT 276 419   Basic Metabolic Panel: Recent Labs  Lab 03/07/22 0035  NA 131*  K 3.5  CL 99  CO2 22  GLUCOSE 75  BUN <5*  CREATININE 0.54*  CALCIUM 7.8*  MG 1.9   Liver Function Tests: Recent Labs  Lab 03/07/22 0035  AST 21  ALT 15  ALKPHOS 74  BILITOT 1.1  PROT 5.4*  ALBUMIN 1.9*   CBG: No results for input(s): "GLUCAP" in the last 168 hours.  Discharge time spent: greater than 30 minutes.  Signed: Berle Mull, MD [facility] 03/07/2022

## 2022-03-20 ENCOUNTER — Encounter: Payer: Self-pay | Admitting: Physician Assistant

## 2022-04-15 NOTE — Progress Notes (Unsigned)
04/16/2022 Jerad Dunlap [id number] 1951-02-21  Referring provider: Velna Hatchet, MD Primary GI doctor: Dr. Carlean Purl  ASSESSMENT AND PLAN:   Assessment: 71 y.o. male here for assessment of the following: 1. Pancreatic mass   2. Other ascites   3. Normocytic anemia   4. Alcohol use   5. Protein-calorie malnutrition, unspecified severity ([place])    71 year old male with history of HTN, ETOH abuse, tobacco abuse, remote history of pancreatitis 2 years ago presents s/p hospitalization for AB/nausea/vomiting found to have ill-defined hypodense region within the and cannot process of the pancreas favoring pancreatic mass over focal pancreatitis and ascites.  The suspected soft tissue mass was hemorrhagic debris on repeat CT, unable to biopsy omental irregularity.  He had negative cytology for ascites fluid, neutrophil count 220, LDH elevated.  He had negative C19-9, CEA, AFP.  Normal INR Normal colon 2019 with Dr. Paulita Fujita- will try to get records.  He has lost 25 lbs in 6 months.  He has quit drinking/smoking. Steady decrease in H/H- normal B12 and folate in the hospital  Plan: - Check iron/ferritin, possible from hemorraghic ascites, colon 2019 per patient, may benefit from endoscopic evaluation with EGD.  - reassuring negative CA19-9 but has had weight loss and hemorrhagic ascites, no malignant cells identified, no obvious ascites today.  - continue PPI once daily - will discuss with Dr. Rush Landmark if we need to evaluate with MRCP or proceed EUS.  - -Alcohol Abstinence counseling discussed with patient as continued use is strongly associated with worsening liver disease progression  - add ensure  Orders Placed This Encounter  Procedures   CBC with Differential/Platelet   Comprehensive metabolic panel   IBC + Ferritin   Protime-INR    Patient Care Team: Velna Hatchet, MD as PCP - General (Internal Medicine)  HISTORY OF PRESENT ILLNESS: 71 y.o. male with a past medical  history of HTN, arthritis, alcohol use with history of alcoholic pancreatitis and others listed below, presents for hospital follow up.   Patient was in the hospital from 02/27/22 to 03/07/22, was in the hospital for alcoholic pancreatitis with ascites and possible mass on CT.    CT AP moderate fluid throughout the abdomen fluids which may be partially loculated, areas of omental nodularity and suspected soft tissue mass in the right paracolic gutter concerning for peritoneal carcinomatosis, ill-defined hypodense region within the and cannot process of the pancreas favoring pancreatic mass over focal pancreatitis Repeat CT 03/06/2022 with IR for possible biopsy was unsuccessful, previous mass like density in right pericolic gutter was no longer present and likely reflected hemorrhagic debris.  AFP, CEA, 19-9 all negative.  Cytology showed no malignant cells.  Color red, cloudy, neutrophil count 220.  LDH at 132 elevated. Gram stain negative culture negative.  Was drinking 2 40 oz beers a day. States he is no longer drinking or smoking.  Had repeat labs with PCP 03/11/2022 that showed sodium 133 improved potassium 3.4,  HGB 9.0 (12.3 on 02/28/22) MCV 101.2, platelets 414,  WBC 5.8 Positive nitrates on urine but negative culture.   He states he had a week of drinking heavily with friends and noticed he was not eating.  Was having intermittent AB pain that week, worse with eating, had nausea. Had similar episode 2 years ago and was told not to drink so much. Could barely keep down liquids so went ot the ER.   He has drank beer mainly for 50 years, drinks 2-3 40 oz a daily.  He smokes cigarettes for 40 years. He has stopped smoking/drinking since the hospital. No shakes/tremors/hallucinations.  Denies family history of colon cancer, GI malignancy, liver or pancreatic disease.  Unable to see report but had colonoscopy 03/2020 with Dr. Paulita Fujita at [place], patient states due in 5 years.   Denies GERD,  nausea or vomiting, dysphagia but does describe a tightness in his chest. No exertional chest pain, SOB.  Denies black stool, blood in stool.  Has BM once a day, brown in color, no diarrhea, constipation.  No swelling in his legs or AB.  No yellowing of eyes, skin, dark urine.  He states he has had weight loss, 25 lbs over 6 months.  No NSAIDS.   Current Medications:        Current Outpatient Medications (Other):    pantoprazole (PROTONIX) 40 MG tablet, Take 1 tablet (40 mg total) by mouth daily.  Medical History:  Past Medical History:  Diagnosis Date   Alcohol-induced pancreatitis    Arm fracture, left    Arthritis    Hypertension    Normocytic anemia    Plantar fasciitis    Allergies: No Known Allergies   Surgical History:  He  has a past surgical history that includes IR Paracentesis (02/28/2022). Family History:  His family history is not on file.  REVIEW OF SYSTEMS  : All other systems reviewed and negative except where noted in the History of Present Illness.  PHYSICAL EXAM: BP 115/71   Pulse 71   Ht '6\' 3"'$  (1.905 m)   Wt 135 lb 12.8 oz (61.6 kg)   SpO2 99%   BMI 16.97 kg/m  General:   Thin appearing AA male in no acute distress Head:   Normocephalic and atraumatic. Eyes:  sclerae anicteric,conjunctive pink  Heart:   regular rate and rhythm Pulm:  Clear anteriorly; no wheezing but slightly coarse breath sounds at bases.  Abdomen:   Soft, Non-distended AB, Active bowel sounds. No tenderness . Without guarding and Without rebound, No organomegaly appreciated. Rectal: Not evaluated Extremities:  Without edema. Msk: Symmetrical without gross deformities. Peripheral pulses intact.  Neurologic:  Alert and  oriented x4;  No focal deficits.  Skin:   Dry and intact without significant lesions or rashes. Psychiatric:  Cooperative. Normal mood and affect.    Vladimir Crofts, PA-C 10:01 AM

## 2022-04-16 ENCOUNTER — Telehealth: Payer: Self-pay

## 2022-04-16 ENCOUNTER — Ambulatory Visit (INDEPENDENT_AMBULATORY_CARE_PROVIDER_SITE_OTHER): Payer: Medicare Other | Admitting: Physician Assistant

## 2022-04-16 ENCOUNTER — Other Ambulatory Visit (INDEPENDENT_AMBULATORY_CARE_PROVIDER_SITE_OTHER): Payer: Medicare Other

## 2022-04-16 ENCOUNTER — Encounter: Payer: Self-pay | Admitting: Physician Assistant

## 2022-04-16 ENCOUNTER — Other Ambulatory Visit: Payer: Self-pay

## 2022-04-16 VITALS — BP 115/71 | HR 71 | Ht 75.0 in | Wt 135.8 lb

## 2022-04-16 DIAGNOSIS — D649 Anemia, unspecified: Secondary | ICD-10-CM

## 2022-04-16 DIAGNOSIS — F1090 Alcohol use, unspecified, uncomplicated: Secondary | ICD-10-CM

## 2022-04-16 DIAGNOSIS — K8689 Other specified diseases of pancreas: Secondary | ICD-10-CM

## 2022-04-16 DIAGNOSIS — Z789 Other specified health status: Secondary | ICD-10-CM

## 2022-04-16 DIAGNOSIS — R188 Other ascites: Secondary | ICD-10-CM

## 2022-04-16 DIAGNOSIS — E46 Unspecified protein-calorie malnutrition: Secondary | ICD-10-CM

## 2022-04-16 LAB — CBC WITH DIFFERENTIAL/PLATELET
Basophils Absolute: 0 10*3/uL (ref 0.0–0.1)
Basophils Relative: 0.9 % (ref 0.0–3.0)
Eosinophils Absolute: 0.2 10*3/uL (ref 0.0–0.7)
Eosinophils Relative: 4.1 % (ref 0.0–5.0)
HCT: 29.3 % — ABNORMAL LOW (ref 39.0–52.0)
Hemoglobin: 10.1 g/dL — ABNORMAL LOW (ref 13.0–17.0)
Lymphocytes Relative: 24.6 % (ref 12.0–46.0)
Lymphs Abs: 1.3 10*3/uL (ref 0.7–4.0)
MCHC: 34.4 g/dL (ref 30.0–36.0)
MCV: 95.8 fl (ref 78.0–100.0)
Monocytes Absolute: 0.5 10*3/uL (ref 0.1–1.0)
Monocytes Relative: 10.1 % (ref 3.0–12.0)
Neutro Abs: 3.2 10*3/uL (ref 1.4–7.7)
Neutrophils Relative %: 60.3 % (ref 43.0–77.0)
Platelets: 373 10*3/uL (ref 150.0–400.0)
RBC: 3.06 Mil/uL — ABNORMAL LOW (ref 4.22–5.81)
RDW: 13.8 % (ref 11.5–15.5)
WBC: 5.3 10*3/uL (ref 4.0–10.5)

## 2022-04-16 LAB — COMPREHENSIVE METABOLIC PANEL
ALT: 9 U/L (ref 0–53)
AST: 13 U/L (ref 0–37)
Albumin: 3.5 g/dL (ref 3.5–5.2)
Alkaline Phosphatase: 142 U/L — ABNORMAL HIGH (ref 39–117)
BUN: 5 mg/dL — ABNORMAL LOW (ref 6–23)
CO2: 27 mEq/L (ref 19–32)
Calcium: 8.8 mg/dL (ref 8.4–10.5)
Chloride: 96 mEq/L (ref 96–112)
Creatinine, Ser: 0.56 mg/dL (ref 0.40–1.50)
GFR: 99.2 mL/min (ref >60.00)
Glucose, Bld: 88 mg/dL (ref 70–99)
Potassium: 3.8 mEq/L (ref 3.5–5.1)
Sodium: 132 mEq/L — ABNORMAL LOW (ref 135–145)
Total Bilirubin: 0.4 mg/dL (ref 0.2–1.2)
Total Protein: 7.2 g/dL (ref 6.0–8.3)

## 2022-04-16 LAB — IBC + FERRITIN
Ferritin: 313.3 ng/mL (ref 22.0–322.0)
Iron: 57 ug/dL (ref 42–165)
Saturation Ratios: 18.5 % — ABNORMAL LOW (ref 20.0–50.0)
TIBC: 308 ug/dL (ref 250.0–450.0)
Transferrin: 220 mg/dL (ref 212.0–360.0)

## 2022-04-16 LAB — PROTIME-INR
INR: 1.1 ratio — ABNORMAL HIGH (ref 0.8–1.0)
Prothrombin Time: 12 s (ref 9.6–13.1)

## 2022-04-16 NOTE — Patient Instructions (Addendum)
Your provider has requested that you go to the basement level for lab work before leaving today. Press "B" on the elevator. The lab is located at the first door on the left as you exit the elevator.  Keep not drinking and smoking! Great job! Go to the ER if you have any yellowing of the skin/eyes, dark urine, severe AB pain, fever, chills, nausea or vomiting.    Behavioral Health Resources in the [facility]   Intensive Outpatient Programs:  [facility]  [place]. [place], [place]  Both a day and evening program  [facility]  [address]  [place], [place] [address]  [phone]  [facility]: [facility]  [place]  [phone]  [place]: [phone] or [phone]  [address] [place], [place] [address]  [email or web address]   Substance Abuse Resources:  [facility] [place] [phone]  [facility] [place]  Chinita Pester [phone]  Residential & Outpatient Substance Abuse Program [phone]  Psychological Services:  [place] [phone]  [place], [place] [address] [place], [place] LINE: [phone] or [phone], [email or web address]  Mobile Crisis Teams:  [facility]  Mobile Crisis Care Unit  [phone]  Assertive  Psychotherapeutic Services  [address] [place]  [phone]   Self-Help/Support Groups:  [facility] [employer] [employer] of support groups  [phone] (call for more info)  Narcotics Anonymous (NA)  [facility]  [address]  [employer] - 2 meetings at this location

## 2022-04-16 NOTE — Telephone Encounter (Signed)
Left message for patient to call back. CT order placed & schedulers notified.

## 2022-04-16 NOTE — Telephone Encounter (Signed)
-----   Message -----  From: Vladimir Crofts, PA-C  Sent: 04/16/2022   1:24 PM EDT  To: Irving Copas., MD; Carl Best, RN   Patient was fine with imaging first, I had discussed both options with him since last CT showed possible focal pancreatitis versus mass.  Donita Brooks, can we please get pancreas protocol CT AB and pelvis, pending this result will schedule for EUS. Call with any worsening AB pain, nausea, vomiting, or AB swelling.  Thanks!  Estill Bamberg

## 2022-04-17 ENCOUNTER — Other Ambulatory Visit: Payer: Self-pay

## 2022-04-17 DIAGNOSIS — K8689 Other specified diseases of pancreas: Secondary | ICD-10-CM

## 2022-04-17 LAB — LIPASE: Lipase: 23 U/L (ref 11.0–59.0)

## 2022-04-17 NOTE — Telephone Encounter (Signed)
Left detailed message for patient with scheduling number provided.

## 2022-04-17 NOTE — Telephone Encounter (Signed)
Patient returned call & scheduling number provided. He has been advised to call the number if he has not heard from schedulers within 1 week.

## 2022-04-17 NOTE — Telephone Encounter (Signed)
MRCP ordered. Schedulers notified. Patient is aware schedulers will reach out to him in about 1 week. He's asked that I call him back in 30 minutes to provide scheduling number once he is home.  Vladimir Crofts, PA-C  [place], Salvadore Dom, RN Dr. Carlean Purl prefers the MRCP. Could we cancel the CT protocol and schedule for the MRCP?  Thanks!

## 2022-05-01 ENCOUNTER — Other Ambulatory Visit: Payer: Self-pay | Admitting: Physician Assistant

## 2022-05-01 ENCOUNTER — Encounter (HOSPITAL_COMMUNITY): Payer: Self-pay

## 2022-05-01 ENCOUNTER — Ambulatory Visit (HOSPITAL_COMMUNITY)
Admission: RE | Admit: 2022-05-01 | Discharge: 2022-05-01 | Disposition: A | Discharge status: Home / Self Care | Payer: Medicare Other | Source: Ambulatory Visit | Attending: Physician Assistant | Admitting: Physician Assistant

## 2022-05-01 DIAGNOSIS — K8689 Other specified diseases of pancreas: Secondary | ICD-10-CM

## 2022-05-01 NOTE — Progress Notes (Signed)
Pt came for MRI. No previous MRIs before (pt was confused with CT). Pt was anxious about scanner so we centered him and put him in the scanner where he would be for his exam. Pt did not want to attempt exam. Advised pt to contact ordering provider and discuss his options moving forward.

## 2022-05-08 ENCOUNTER — Other Ambulatory Visit: Payer: Self-pay

## 2022-05-08 ENCOUNTER — Other Ambulatory Visit (HOSPITAL_COMMUNITY): Payer: Self-pay

## 2022-05-08 ENCOUNTER — Telehealth: Payer: Self-pay

## 2022-05-08 DIAGNOSIS — K8689 Other specified diseases of pancreas: Secondary | ICD-10-CM

## 2022-05-08 NOTE — Telephone Encounter (Signed)
CT scan ordered. Patient is aware & schedulers notified. Scheduling number has been provided for patient if he has not heard from schedulers within 1 week.

## 2022-05-08 NOTE — Telephone Encounter (Signed)
Logan Crofts, PA-C  [place], Logan Dom, RN; Logan Chambers, Logan Nab., MD  Read in notes, patient is was unable to perform MRI due to claustrophobia.  Please set up CT pancreatic protocol for pancreatic mass since patient is unable to complete MRI.  Just an FYI to Dr. Rush Landmark.  Thanks,  Logan Chambers

## 2022-05-15 ENCOUNTER — Ambulatory Visit (HOSPITAL_COMMUNITY)
Admission: RE | Admit: 2022-05-15 | Discharge: 2022-05-15 | Disposition: A | Discharge status: Home / Self Care | Payer: Medicare Other | Source: Ambulatory Visit | Attending: Physician Assistant | Admitting: Physician Assistant

## 2022-05-15 DIAGNOSIS — K8689 Other specified diseases of pancreas: Secondary | ICD-10-CM | POA: Insufficient documentation

## 2022-05-15 MED ORDER — IOHEXOL 350 MG/ML SOLN
80.0000 mL | Freq: Once | INTRAVENOUS | Status: AC | PRN
  Administered 2022-05-15: 80 mL via INTRAVENOUS

## 2022-05-17 ENCOUNTER — Other Ambulatory Visit: Payer: Self-pay

## 2022-05-17 DIAGNOSIS — K8689 Other specified diseases of pancreas: Secondary | ICD-10-CM

## 2022-05-23 ENCOUNTER — Other Ambulatory Visit: Payer: Medicare Other

## 2022-05-23 DIAGNOSIS — K8689 Other specified diseases of pancreas: Secondary | ICD-10-CM

## 2022-05-24 LAB — CANCER ANTIGEN 19-9: CA 19-9: 20 U/mL (ref <34)

## 2022-05-27 ENCOUNTER — Telehealth: Payer: Self-pay

## 2022-05-27 NOTE — Telephone Encounter (Signed)
Staff reminder sent to self for CT orders & scheduling in 6 weeks.  Sent on 05/20/22: [place], Telford Nab., MD  Vladimir Crofts, PA-C; Gatha Mayer, MD; Carl Best, RN  CG and [facility],  Tentative plan pancreas protocol CT abdomen in 6 weeks.  Have patient obtain a CA 19-9 soon.  If the CA 19-9 is elevated, then we will move forward with EUS sooner than the pancreas protocol CT.  Thanks.  GM

## 2022-07-01 ENCOUNTER — Other Ambulatory Visit: Payer: Self-pay

## 2022-07-01 DIAGNOSIS — K8689 Other specified diseases of pancreas: Secondary | ICD-10-CM

## 2022-07-16 ENCOUNTER — Ambulatory Visit (HOSPITAL_COMMUNITY)
Admission: RE | Admit: 2022-07-16 | Discharge: 2022-07-16 | Disposition: A | Discharge status: Home / Self Care | Payer: Medicare Other | Source: Ambulatory Visit | Attending: Physician Assistant | Admitting: Physician Assistant

## 2022-07-16 DIAGNOSIS — K8689 Other specified diseases of pancreas: Secondary | ICD-10-CM | POA: Diagnosis not present

## 2022-07-16 MED ORDER — IOHEXOL 300 MG/ML  SOLN
100.0000 mL | Freq: Once | INTRAMUSCULAR | Status: AC | PRN
  Administered 2022-07-16: 100 mL via INTRAVENOUS

## 2022-07-16 MED ORDER — SODIUM CHLORIDE (PF) 0.9 % IJ SOLN
INTRAMUSCULAR | Status: AC
  Filled 2022-07-16: qty 50

## 2022-07-23 ENCOUNTER — Other Ambulatory Visit: Payer: Self-pay

## 2022-07-23 ENCOUNTER — Other Ambulatory Visit (INDEPENDENT_AMBULATORY_CARE_PROVIDER_SITE_OTHER): Payer: Medicare Other

## 2022-07-23 DIAGNOSIS — K8689 Other specified diseases of pancreas: Secondary | ICD-10-CM | POA: Diagnosis not present

## 2022-07-23 LAB — CBC WITH DIFFERENTIAL/PLATELET
Basophils Absolute: 0.1 K/uL (ref 0.0–0.1)
Basophils Relative: 1.2 % (ref 0.0–3.0)
Eosinophils Absolute: 0.3 K/uL (ref 0.0–0.7)
Eosinophils Relative: 7.2 % — ABNORMAL HIGH (ref 0.0–5.0)
HCT: 31.2 % — ABNORMAL LOW (ref 39.0–52.0)
Hemoglobin: 10.5 g/dL — ABNORMAL LOW (ref 13.0–17.0)
Lymphocytes Relative: 35.6 % (ref 12.0–46.0)
Lymphs Abs: 1.6 K/uL (ref 0.7–4.0)
MCHC: 33.6 g/dL (ref 30.0–36.0)
MCV: 88.9 fl (ref 78.0–100.0)
Monocytes Absolute: 0.6 K/uL (ref 0.1–1.0)
Monocytes Relative: 12.6 % — ABNORMAL HIGH (ref 3.0–12.0)
Neutro Abs: 1.9 K/uL (ref 1.4–7.7)
Neutrophils Relative %: 43.4 % (ref 43.0–77.0)
Platelets: 336 K/uL (ref 150.0–400.0)
RBC: 3.51 Mil/uL — ABNORMAL LOW (ref 4.22–5.81)
RDW: 15.1 % (ref 11.5–15.5)
WBC: 4.4 K/uL (ref 4.0–10.5)

## 2022-07-23 LAB — COMPREHENSIVE METABOLIC PANEL WITH GFR
ALT: 29 U/L (ref 0–53)
AST: 23 U/L (ref 0–37)
Albumin: 3.9 g/dL (ref 3.5–5.2)
Alkaline Phosphatase: 563 U/L — ABNORMAL HIGH (ref 39–117)
BUN: 8 mg/dL (ref 6–23)
CO2: 28 meq/L (ref 19–32)
Calcium: 9 mg/dL (ref 8.4–10.5)
Chloride: 97 meq/L (ref 96–112)
Creatinine, Ser: 0.63 mg/dL (ref 0.40–1.50)
GFR: 95.55 mL/min
Glucose, Bld: 73 mg/dL (ref 70–99)
Potassium: 4.3 meq/L (ref 3.5–5.1)
Sodium: 132 meq/L — ABNORMAL LOW (ref 135–145)
Total Bilirubin: 1.4 mg/dL — ABNORMAL HIGH (ref 0.2–1.2)
Total Protein: 7.9 g/dL (ref 6.0–8.3)

## 2022-07-23 LAB — PROTIME-INR
INR: 1.1 ratio — ABNORMAL HIGH (ref 0.8–1.0)
Prothrombin Time: 12 s (ref 9.6–13.1)

## 2022-07-23 LAB — LIPASE: Lipase: 27 U/L (ref 11.0–59.0)

## 2022-07-24 LAB — CANCER ANTIGEN 19-9: CA 19-9: 31 U/mL (ref <34)

## 2022-07-25 ENCOUNTER — Other Ambulatory Visit: Payer: Self-pay

## 2022-07-25 DIAGNOSIS — K8689 Other specified diseases of pancreas: Secondary | ICD-10-CM

## 2022-08-01 ENCOUNTER — Other Ambulatory Visit (INDEPENDENT_AMBULATORY_CARE_PROVIDER_SITE_OTHER): Payer: Medicare Other

## 2022-08-01 ENCOUNTER — Ambulatory Visit (INDEPENDENT_AMBULATORY_CARE_PROVIDER_SITE_OTHER): Payer: Medicare Other | Admitting: Internal Medicine

## 2022-08-01 ENCOUNTER — Encounter: Payer: Self-pay | Admitting: Internal Medicine

## 2022-08-01 VITALS — BP 124/76 | HR 56 | Ht 75.0 in | Wt 141.0 lb

## 2022-08-01 DIAGNOSIS — K838 Other specified diseases of biliary tract: Secondary | ICD-10-CM | POA: Diagnosis not present

## 2022-08-01 DIAGNOSIS — K8689 Other specified diseases of pancreas: Secondary | ICD-10-CM | POA: Diagnosis not present

## 2022-08-01 LAB — HEPATIC FUNCTION PANEL
ALT: 13 U/L (ref 0–53)
AST: 16 U/L (ref 0–37)
Albumin: 4.3 g/dL (ref 3.5–5.2)
Alkaline Phosphatase: 341 U/L — ABNORMAL HIGH (ref 39–117)
Bilirubin, Direct: 0.4 mg/dL — ABNORMAL HIGH (ref 0.0–0.3)
Total Bilirubin: 1 mg/dL (ref 0.2–1.2)
Total Protein: 8.3 g/dL (ref 6.0–8.3)

## 2022-08-01 NOTE — Progress Notes (Signed)
Logan Chambers 71 y.o. 1950/12/14 [id number]  Assessment & Plan:   Encounter Diagnoses  Name Primary?   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  Malignancy not excluded.  There is biliary obstruction developing.  Clinically not an issue right now.  He was advised of what to watch out for as far as jaundice or signs of cholangitis and to contact us if so.  He is scheduled for an EUS September 09, 2022.  We have reviewed those instructions today.  I will recheck LFTs today.   Depending upon clinical course he could require biliary stenting but if his pancreatic cystic lesion in the head of the pancreas is aspirated and drained that might resolve the issue as well depending upon the etiology of the obstruction.  CC: Velna Hatchet, MD   Subjective:   Chief Complaint: Pancreatic lesions/masses  HPI 71 year old African-American man with 2 pancreatic lesions in the setting of prior alcohol abuse who presents for follow-up after recent imaging.  Originally seen in the summer in the hospital and we thought he had carcinomatosis but that did not pan out.  Subsequently CA 19-9 is not elevated, and he has had 2 cystic lesions in the pancreas reimaged.  CT abdomen pelvis 07/17/2022 after seeing Vicie Mutters, PA-C  IMPRESSION: 1. Stable 2.7 x 2.5 cm cystic lesion in the pancreatic head. 2. Slight interval increase in size of a cystic lesion in the pancreatic tail. 3. Persistent and slightly progressive intrahepatic and extrahepatic biliary dilatation. Stable appearing pancreatic ductal dilatation. Progressive gallbladder distension. Patient may need biliary drainage. 4. Stable emphysematous changes and pulmonary scarring. 5. Stable advanced atherosclerotic calcification involving the aorta and branch vessels. 6. Small amount of scattered fluid most notably in the right pericolic gutter and around the vena  cava. 7. Aortic atherosclerosis.  He was unable to tolerate an MRCP so that is why he had a CT abdomen and pelvis.  EUS is planned for January 15.  Lab Results  Component Value Date   ALT 29 07/23/2022   AST 23 07/23/2022   ALKPHOS 563 (H) 07/23/2022   BILITOT 1.4 (H) 07/23/2022   CA 19-9 on same date is 31, lipase is normal.     Latest Ref Rng & Units 07/23/2022   10:46 AM 04/16/2022   10:15 AM 03/07/2022   12:35 AM  CBC  WBC 4.0 - 10.5 K/uL 4.4  5.3  6.3   Hemoglobin 13.0 - 17.0 g/dL 10.5  10.1  8.1   Hematocrit 39.0 - 52.0 % 31.2  29.3  22.5   Platelets 150.0 - 400.0 K/uL 336.0  373.0  301    Lab Results  Component Value Date   FERRITIN 313.3 04/16/2022   Lab Results  Component Value Date   VITAMINB12 625 02/28/2022   Lab Results  Component Value Date   FOLATE 17.3 02/28/2022    The patient denies any signs of jaundice (icterus, dark urine pale stools) abdominal pain other than some transient epigastric pain around the time of the CT scan, pruritus or fever.  He has given up smoking cigarettes and alcohol.  He is gaining some weight back.  He denies any diarrhea.  Wt Readings from Last 3 Encounters:  08/01/22 141 lb (64 kg)  04/16/22 135 lb 12.8 oz (61.6 kg)  09/14/19 150 lb (68 kg)     No Known Allergies Current Meds  Medication Sig   pantoprazole (  PROTONIX) 40 MG tablet Take 1 tablet (40 mg total) by mouth daily.   Past Medical History:  Diagnosis Date   Alcohol-induced pancreatitis    Arm fracture, left    Arthritis    Hypertension    Normocytic anemia    Pancreatic mass-cystic    Plantar fasciitis    Past Surgical History:  Procedure Laterality Date   IR PARACENTESIS  02/28/2022   Social History   Social History Narrative   The patient lives alone, he has 2 supportive daughters in [place]   Former significant alcohol use and smoking history stopped 2023   family history is not on file.   Review of Systems See HPI  Objective:    Physical Exam '@BP'$  124/76   Pulse (!) 56   Ht '6\' 3"'$  (1.905 m)   Wt 141 lb (64 kg)   BMI 17.62 kg/m @  General:  NAD thin black male in no acute distress Eyes:   anicteric Lungs:  clear Heart::  S1S2 no rubs, murmurs or gallops Abdomen:  soft and nontender, BS+, very thin.     Data Reviewed:  See HPI but I have reviewed previous GI notes, and the imaging reports and directly reviewed the most recent CT scan

## 2022-08-01 NOTE — Patient Instructions (Signed)
Your provider has requested that you go to the basement level for lab work before leaving today. Press "B" on the elevator. The lab is located at the first door on the left as you exit the elevator.  _______________________________________________________  If you are age 71 or older, your body mass index should be between 23-30. Your Body mass index is 17.62 kg/m. If this is out of the aforementioned range listed, please consider follow up with your Primary Care Provider.  If you are age 69 or younger, your body mass index should be between 19-25. Your Body mass index is 17.62 kg/m. If this is out of the aformentioned range listed, please consider follow up with your Primary Care Provider.   ________________________________________________________  The [facility] providers would like to encourage you to use [facility] to communicate with providers for non-urgent requests or questions.  Due to long hold times on the telephone, sending your provider a message by [facility] may be a faster and more efficient way to get a response.  Please allow 48 business hours for a response.  Please remember that this is for non-urgent requests.  _______________________________________________________

## 2022-08-28 ENCOUNTER — Other Ambulatory Visit: Payer: Self-pay

## 2022-08-28 DIAGNOSIS — K838 Other specified diseases of biliary tract: Secondary | ICD-10-CM

## 2022-08-28 DIAGNOSIS — K8689 Other specified diseases of pancreas: Secondary | ICD-10-CM

## 2022-08-28 NOTE — Progress Notes (Signed)
The pt has been advised to come in for repeat labs.  Order entered

## 2022-09-02 ENCOUNTER — Other Ambulatory Visit (INDEPENDENT_AMBULATORY_CARE_PROVIDER_SITE_OTHER): Payer: Medicare Other

## 2022-09-02 ENCOUNTER — Encounter (HOSPITAL_COMMUNITY): Payer: Self-pay | Admitting: Gastroenterology

## 2022-09-02 DIAGNOSIS — K8689 Other specified diseases of pancreas: Secondary | ICD-10-CM

## 2022-09-02 DIAGNOSIS — K838 Other specified diseases of biliary tract: Secondary | ICD-10-CM

## 2022-09-02 LAB — HEPATIC FUNCTION PANEL
ALT: 33 U/L (ref 0–53)
AST: 56 U/L — ABNORMAL HIGH (ref 0–37)
Albumin: 4.3 g/dL (ref 3.5–5.2)
Alkaline Phosphatase: 431 U/L — ABNORMAL HIGH (ref 39–117)
Bilirubin, Direct: 0.2 mg/dL (ref 0.0–0.3)
Total Bilirubin: 0.7 mg/dL (ref 0.2–1.2)
Total Protein: 8.1 g/dL (ref 6.0–8.3)

## 2022-09-09 ENCOUNTER — Encounter (HOSPITAL_COMMUNITY): Payer: Self-pay | Admitting: Gastroenterology

## 2022-09-09 ENCOUNTER — Ambulatory Visit (HOSPITAL_COMMUNITY)
Admission: RE | Admit: 2022-09-09 | Discharge: 2022-09-09 | Disposition: A | Discharge status: Home / Self Care | Payer: 59 | Attending: Gastroenterology | Admitting: Gastroenterology

## 2022-09-09 ENCOUNTER — Ambulatory Visit (HOSPITAL_BASED_OUTPATIENT_CLINIC_OR_DEPARTMENT_OTHER): Payer: 59 | Admitting: Anesthesiology

## 2022-09-09 ENCOUNTER — Encounter (HOSPITAL_COMMUNITY)
Admission: RE | Disposition: A | Discharge status: Home / Self Care | Payer: Self-pay | Source: Home / Self Care | Attending: Gastroenterology

## 2022-09-09 ENCOUNTER — Ambulatory Visit (HOSPITAL_COMMUNITY): Payer: 59 | Admitting: Anesthesiology

## 2022-09-09 DIAGNOSIS — K862 Cyst of pancreas: Secondary | ICD-10-CM | POA: Diagnosis not present

## 2022-09-09 DIAGNOSIS — K828 Other specified diseases of gallbladder: Secondary | ICD-10-CM

## 2022-09-09 DIAGNOSIS — K838 Other specified diseases of biliary tract: Secondary | ICD-10-CM

## 2022-09-09 DIAGNOSIS — K31819 Angiodysplasia of stomach and duodenum without bleeding: Secondary | ICD-10-CM

## 2022-09-09 DIAGNOSIS — I10 Essential (primary) hypertension: Secondary | ICD-10-CM | POA: Insufficient documentation

## 2022-09-09 DIAGNOSIS — K2289 Other specified disease of esophagus: Secondary | ICD-10-CM

## 2022-09-09 DIAGNOSIS — K449 Diaphragmatic hernia without obstruction or gangrene: Secondary | ICD-10-CM

## 2022-09-09 DIAGNOSIS — K8689 Other specified diseases of pancreas: Secondary | ICD-10-CM | POA: Diagnosis not present

## 2022-09-09 DIAGNOSIS — K2950 Unspecified chronic gastritis without bleeding: Secondary | ICD-10-CM | POA: Insufficient documentation

## 2022-09-09 DIAGNOSIS — Z87891 Personal history of nicotine dependence: Secondary | ICD-10-CM | POA: Insufficient documentation

## 2022-09-09 DIAGNOSIS — R7401 Elevation of levels of liver transaminase levels: Secondary | ICD-10-CM | POA: Insufficient documentation

## 2022-09-09 HISTORY — PX: EUS: SHX5427

## 2022-09-09 HISTORY — PX: FINE NEEDLE ASPIRATION: SHX5430

## 2022-09-09 HISTORY — PX: HOT HEMOSTASIS: SHX5433

## 2022-09-09 HISTORY — PX: BIOPSY: SHX5522

## 2022-09-09 HISTORY — PX: ESOPHAGOGASTRODUODENOSCOPY: SHX5428

## 2022-09-09 SURGERY — UPPER ENDOSCOPIC ULTRASOUND (EUS) RADIAL
Anesthesia: Monitor Anesthesia Care

## 2022-09-09 MED ORDER — CIPROFLOXACIN IN D5W 400 MG/200ML IV SOLN
INTRAVENOUS | Status: AC
  Filled 2022-09-09: qty 200

## 2022-09-09 MED ORDER — HYDRALAZINE HCL 20 MG/ML IJ SOLN
INTRAMUSCULAR | Status: DC | PRN
  Administered 2022-09-09: 2 mg via INTRAVENOUS

## 2022-09-09 MED ORDER — CIPROFLOXACIN IN D5W 400 MG/200ML IV SOLN
INTRAVENOUS | Status: DC | PRN
  Administered 2022-09-09: 400 mg via INTRAVENOUS

## 2022-09-09 MED ORDER — HYDRALAZINE HCL 20 MG/ML IJ SOLN
10.0000 mg | Freq: Once | INTRAMUSCULAR | Status: AC
  Administered 2022-09-09: 10 mg via INTRAVENOUS

## 2022-09-09 MED ORDER — HYDRALAZINE HCL 20 MG/ML IJ SOLN
INTRAMUSCULAR | Status: AC
  Filled 2022-09-09: qty 1

## 2022-09-09 MED ORDER — LACTATED RINGERS IV SOLN: INTRAVENOUS | Status: DC

## 2022-09-09 MED ORDER — ONDANSETRON HCL 4 MG/2ML IJ SOLN
INTRAMUSCULAR | Status: DC | PRN
  Administered 2022-09-09: 4 mg via INTRAVENOUS

## 2022-09-09 MED ORDER — LIDOCAINE HCL 1 % IJ SOLN
INTRAMUSCULAR | Status: DC | PRN
  Administered 2022-09-09: 50 mg via INTRADERMAL

## 2022-09-09 MED ORDER — SODIUM CHLORIDE 0.9 % IV SOLN: INTRAVENOUS | Status: DC

## 2022-09-09 MED ORDER — PROPOFOL 500 MG/50ML IV EMUL
INTRAVENOUS | Status: DC | PRN
  Administered 2022-09-09: 120 ug/kg/min via INTRAVENOUS

## 2022-09-09 MED ORDER — CIPROFLOXACIN HCL 500 MG PO TABS: 500.0000 mg | ORAL_TABLET | Freq: Two times a day (BID) | ORAL | 0 refills | Status: AC

## 2022-09-09 MED ORDER — PROPOFOL 10 MG/ML IV BOLUS
INTRAVENOUS | Status: DC | PRN
  Administered 2022-09-09 (×3): 20 mg via INTRAVENOUS
  Administered 2022-09-09: 30 mg via INTRAVENOUS

## 2022-09-09 MED ORDER — PROPOFOL 1000 MG/100ML IV EMUL
INTRAVENOUS | Status: AC
  Filled 2022-09-09: qty 100

## 2022-09-09 NOTE — Op Note (Signed)
[facility] Patient Name: Logan Chambers Procedure Date: 09/09/2022 MRN: [id number] Attending MD: Justice Britain , MD, [id number] Date of Birth: 1951-06-22 CSN: [id number] Age: 72 Admit Type: Outpatient Procedure:                Upper EUS Indications:              Common bile duct dilation (etiology unknown) seen                            on CT scan, Dilated pancreatic duct on CT scan,                            Pancreatic cyst on CT scan, Elevated liver enzymes Providers:                Justice Britain, MD, Burtis Junes, RN, Cletis Athens,                            Technician Referring MD:             Gatha Mayer, MD, Vicie Mutters Medicines:                Monitored Anesthesia Care, Cipro 366 mg IV Complications:            No immediate complications. Estimated Blood Loss:     Estimated blood loss was minimal. Procedure:                Pre-Anesthesia Assessment:                           - Prior to the procedure, a History and Physical                            was performed, and patient medications and                            allergies were reviewed. The patient's tolerance of                            previous anesthesia was also reviewed. The risks                            and benefits of the procedure and the sedation                            options and risks were discussed with the patient.                            All questions were answered, and informed consent                            was obtained. Prior Anticoagulants: The patient has                            taken no anticoagulant or antiplatelet agents. ASA  Grade Assessment: II - A patient with mild systemic                            disease. After reviewing the risks and benefits,                            the patient was deemed in satisfactory condition to                            undergo the procedure.                           After obtaining  informed consent, the endoscope was                            passed under direct vision. Throughout the                            procedure, the patient's blood pressure, pulse, and                            oxygen saturations were monitored continuously. The                            [id number]-H190 ([id number]) Olympus endoscope was introduced                            through the mouth, and advanced to the second part                            of duodenum. The [id number]-[id number] ([id number]) Olympus                            duodenoscope was introduced through the mouth, and                            advanced to the area of papilla. The [id number]                            ([id number]) Olympus linear ultrasound scope was                            introduced through the mouth, and advanced to the                            duodenum for ultrasound examination from the                            stomach and duodenum. The upper EUS was                            accomplished without difficulty. The patient  tolerated the procedure. Scope In: Scope Out: Findings:      ENDOSCOPIC FINDING: :      No gross lesions were noted in the entire esophagus.      The Z-line was irregular and was found 44 cm from the incisors.      A 1 cm hiatal hernia was present.      Patchy mildly erythematous mucosa without bleeding was found in the       entire examined stomach. Biopsies were taken with a cold forceps for       histology and Helicobacter pylori testing.      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       Fulguration to ablate the lesion to prevent bleeding by argon plasma was       successful.      No gross lesions were noted in the duodenal bulb, in the first portion       of the duodenum and in the second portion of the duodenum.      The major papilla was located inferiorly but  otherwise normal in       appearance.      ENDOSONOGRAPHIC FINDING: :      An anechoic lesion suggestive of a cyst was identified in the       peripancreatic head region. It does not communicate with the pancreatic       duct. The lesion measured 11 mm by 5 mm in maximal cross-sectional       diameter. There was a single compartment without septae. The outer wall       of the lesion was thin. There was no associated mass. There was no       internal debris within the fluid-filled cavity. Diagnostic needle       aspiration for fluid was performed. Color Doppler imaging was utilized       prior to needle puncture to confirm a lack of significant vascular       structures within the needle path. One pass was made with the Expect 22       gauge needle using a transduodenal approach. No stylet was used. The       amount of fluid collected was less than 1 mL. The fluid was cloudy,       blood-tinged and watery. Sample sent for amylase concentration, cytology       and CEA.      The pancreatic duct had a dilated endosonographic appearance, had a       prominently branched endosonographic appearance, had a tortuous/ectatic       appearance and had hyperechoic walls in the pancreatic head (2.8 mm ->       3.5 mm), genu of the pancreas (3.1 mm -> 4.7 mm), body of the pancreas       (4.7 mm) and tail of the pancreas (3.0 mm -> 2.1 mm).      Pancreatic parenchymal abnormalities were noted in the entire pancreas.       These consisted of atrophy, hyperechoic foci with shadowing, lobularity       with honeycombing and hyperechoic strands. In the area where the bile       duct and pancreas duct began to dilate further, even though a overt mass       was not noted, I did want to rule  out underlying malignancy. A set of       fine needle biopsies were performed. Color Doppler imaging was utilized       prior to needle puncture to confirm a lack of significant vascular       structures within the needle  path. Four passes were made with the 22       gauge Acquire biopsy needle using a transduodenal approach. Visible       cores of tissue were obtained. Preliminary cytologic examination and       touch preps were performed. Final cytology results are pending.      There was dilation in the common bile duct (4.9 mm -> 7.5 mm), in the       common hepatic duct (10.6 mm). The gallbladder was distended but no       overt stones or significant sludge were found.      Endosonographic imaging of the ampulla showed no intramural       (subepithelial) lesion.      Endosonographic imaging in the visualized portion of the liver showed no       mass.      No malignant-appearing lymph nodes were visualized in the celiac region       (level 20), peripancreatic region and porta hepatis region.      The celiac region visualized. Impression:               EGD impression:                           - No gross lesions in the entire esophagus. Z-line                            irregular, 44 cm from the incisors.                           - 1 cm hiatal hernia.                           - Erythematous mucosa in the stomach. Biopsied.                           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                            Treated with argon plasma coagulation (APC).                           - No gross lesions in the duodenal bulb, in the                            first portion of the duodenum and in the second                            portion of the duodenum.                           - Normal major papilla though pointed significantly  inferior.                           EUS impression:                           - A cystic lesion was seen in the peripancreatic                            head region. Cytology results are pending. However,                            the endosonographic appearance is suggestive of a                            pancreatic pseudocyst versus  intramural cyst versus                            inflammatory cyst. Fine needle aspiration for fluid                            performed.                           - The pancreatic duct had a dilated endosonographic                            appearance, had a prominently branched                            endosonographic appearance, had a tortuous/ectatic                            appearance and had hyperechoic walls in the                            pancreatic head, genu of the pancreas, body of the                            pancreas and tail of the pancreas.                           - Pancreatic parenchymal abnormalities consisting                            of atrophy, hyperechoic foci, lobularity with                            honeycombing and hyperechoic strands were noted in                            the entire pancreas. Fine needle biopsy performed                            in the head of the pancreas where the CBD and PD  were noted to have a more significant dilation                            occur (though overt mass or lesion was not noted).                           - EUS is consistent with chronic pancreatitis                            changes based on [place] criteria.                           - There was dilation in the common bile duct, in                            the common hepatic duct and in the gallbladder.                           - No malignant-appearing lymph nodes were                            visualized in the celiac region (level 20),                            peripancreatic region and porta hepatis region. Moderate Sedation:      Not Applicable - Patient had care per Anesthesia. Recommendation:           - The patient will be observed post-procedure,                            until all discharge criteria are met.                           - Discharge patient to home.                           - Patient has a contact  number available for                            emergencies. The signs and symptoms of potential                            delayed complications were discussed with the                            patient. Return to normal activities tomorrow.                            Written discharge instructions were provided to the                            patient.                           -  Low fat diet.                           - Ciprofloxacin 500 mg twice daily x 3 days                           - Await cytology results and await path results.                           - If LFTs continue to uptrend, and inflammatory                            stricture seems most likely, especially if the                            cytology brushings are negative. He may require a                            biliary stent to be placed if that were to occur.                           - The findings and recommendations were discussed                            with the patient.                           - The findings and recommendations were discussed                            with the designated responsible adult. Procedure Code(s):        --- Professional ---                           [phone], Esophagogastroduodenoscopy, flexible,                            transoral; with transendoscopic ultrasound-guided                            intramural or transmural fine needle                            aspiration/biopsy(s), (includes endoscopic                            ultrasound examination limited to the esophagus,                            stomach or duodenum, and adjacent structures)                           02542, 59, Esophagogastroduodenoscopy, flexible,                            transoral; with control  of bleeding, any method                           43239, 59, Esophagogastroduodenoscopy, flexible,                            transoral; with biopsy, single or multiple Diagnosis Code(s):        ---  Professional ---                           K22.89, Other specified disease of esophagus                           K44.9, Diaphragmatic hernia without obstruction or                            gangrene                           K31.89, Other diseases of stomach and duodenum                           K31.819, Angiodysplasia of stomach and duodenum                            without bleeding                           K86.2, Cyst of pancreas                           K86.9, Disease of pancreas, unspecified                           I89.9, Noninfective disorder of lymphatic vessels                            and lymph nodes, unspecified                           K86.89, Other specified diseases of pancreas                           R74.8, Abnormal levels of other serum enzymes                           R93.3, Abnormal findings on diagnostic imaging of                            other parts of digestive tract                           K83.8, Other specified diseases of biliary tract                           K82.8, Other specified diseases of gallbladder  R93.2, Abnormal findings on diagnostic imaging of                            liver and biliary tract CPT copyright 2022 [employer]. All rights reserved. The codes documented in this report are preliminary and upon coder review may  be revised to meet current compliance requirements. Justice Britain, MD 09/09/2022 12:19:49 PM Number of Addenda: 0

## 2022-09-09 NOTE — Transfer of Care (Signed)
Immediate Anesthesia Transfer of Care Note  Patient: Logan Chambers  Procedure(s) Performed: UPPER ENDOSCOPIC ULTRASOUND (EUS) RADIAL ESOPHAGOGASTRODUODENOSCOPY (EGD) BIOPSY FINE NEEDLE ASPIRATION (FNA) LINEAR  Patient Location: PACU and Endoscopy Unit  Anesthesia Type:MAC  Level of Consciousness: awake, alert , oriented, and patient cooperative  Airway & Oxygen Therapy: Patient Spontanous Breathing and Patient connected to face mask oxygen  Post-op Assessment: Report given to RN and Post -op Vital signs reviewed and stable  Post vital signs: Reviewed and stable  Last Vitals:  Vitals Value Taken Time  BP 111/63 09/09/22 1206  Temp    Pulse 70 09/09/22 1206  Resp 18 09/09/22 1207  SpO2 94 % 09/09/22 1206  Vitals shown include unvalidated device data.  Last Pain:  Vitals:   09/09/22 0911  TempSrc: Tympanic  PainSc: 0-No pain         Complications: No notable events documented.

## 2022-09-09 NOTE — Anesthesia Postprocedure Evaluation (Signed)
Anesthesia Post Note  Patient: Logan Chambers  Procedure(s) Performed: UPPER ENDOSCOPIC ULTRASOUND (EUS) RADIAL ESOPHAGOGASTRODUODENOSCOPY (EGD) BIOPSY FINE NEEDLE ASPIRATION (FNA) LINEAR     Patient location during evaluation: Endoscopy Anesthesia Type: MAC Level of consciousness: oriented, awake and alert and awake Pain management: pain level controlled Vital Signs Assessment: post-procedure vital signs reviewed and stable Respiratory status: spontaneous breathing, nonlabored ventilation, respiratory function stable and patient connected to nasal cannula oxygen Cardiovascular status: blood pressure returned to baseline and stable Postop Assessment: no headache, no backache and no apparent nausea or vomiting Anesthetic complications: no   No notable events documented.  Last Vitals:  Vitals:   09/09/22 1220 09/09/22 1230  BP: (!) 134/58 (!) 149/64  Pulse: 66 65  Resp: 16 14  Temp:    SpO2: 99% 99%    Last Pain:  Vitals:   09/09/22 1230  TempSrc:   PainSc: 0-No pain                 Santa Lighter

## 2022-09-09 NOTE — Anesthesia Preprocedure Evaluation (Addendum)
Anesthesia Evaluation  Patient identified by MRN, date of birth, ID band Patient awake    Reviewed: Allergy & Precautions, NPO status , Patient's Chart, lab work & pertinent test results  Airway Mallampati: II  TM Distance: >3 FB Neck ROM: Full    Dental  (+) Dental Advisory Given, Edentulous Upper   Pulmonary former smoker   Pulmonary exam normal breath sounds clear to auscultation       Cardiovascular hypertension, Normal cardiovascular exam Rhythm:Regular Rate:Normal     Neuro/Psych negative neurological ROS  negative psych ROS   GI/Hepatic ,GERD  Medicated and Controlled,,(+)     substance abuse  alcohol usePancreatic mass   Endo/Other  negative endocrine ROS    Renal/GU negative Renal ROS     Musculoskeletal  (+) Arthritis ,    Abdominal   Peds  Hematology negative hematology ROS (+)   Anesthesia Other Findings Day of surgery medications reviewed with the patient.  Reproductive/Obstetrics                             Anesthesia Physical Anesthesia Plan  ASA: 3  Anesthesia Plan: MAC   Post-op Pain Management:    Induction: Intravenous  PONV Risk Score and Plan: 1 and TIVA and Treatment may vary due to age or medical condition  Airway Management Planned: Natural Airway and Simple Face Mask  Additional Equipment:   Intra-op Plan:   Post-operative Plan:   Informed Consent: I have reviewed the patients History and Physical, chart, labs and discussed the procedure including the risks, benefits and alternatives for the proposed anesthesia with the patient or authorized representative who has indicated his/her understanding and acceptance.     Dental advisory given  Plan Discussed with: CRNA and Anesthesiologist  Anesthesia Plan Comments:        Anesthesia Quick Evaluation

## 2022-09-09 NOTE — Discharge Instructions (Signed)
YOU HAD AN ENDOSCOPIC PROCEDURE TODAY: Refer to the procedure report and other information in the discharge instructions given to you for any specific questions about what was found during the examination. If this information does not answer your questions, please call [place] office at [phone] to clarify.  ° °YOU SHOULD EXPECT: Some feelings of bloating in the abdomen. Passage of more gas than usual. Walking can help get rid of the air that was put into your GI tract during the procedure and reduce the bloating. If you had a lower endoscopy (such as a colonoscopy or flexible sigmoidoscopy) you may notice spotting of blood in your stool or on the toilet paper. Some abdominal soreness may be present for a day or two, also. ° °DIET: Your first meal following the procedure should be a light meal and then it is ok to progress to your normal diet. A half-sandwich or bowl of soup is an example of a good first meal. Heavy or fried foods are harder to digest and may make you feel nauseous or bloated. Drink plenty of fluids but you should avoid alcoholic beverages for 24 hours. If you had a esophageal dilation, please see attached instructions for diet.   ° °ACTIVITY: Your care partner should take you home directly after the procedure. You should plan to take it easy, moving slowly for the rest of the day. You can resume normal activity the day after the procedure however YOU SHOULD NOT DRIVE, use power tools, machinery or perform tasks that involve climbing or major physical exertion for 24 hours (because of the sedation medicines used during the test).  ° °SYMPTOMS TO REPORT IMMEDIATELY: °A gastroenterologist can be reached at any hour. Please call [phone]  for any of the following symptoms:  °Following lower endoscopy (colonoscopy, flexible sigmoidoscopy) °Excessive amounts of blood in the stool  °Significant tenderness, worsening of abdominal pains  °Swelling of the abdomen that is new, acute  °Fever of 100° or  higher  °Following upper endoscopy (EGD, EUS, ERCP, esophageal dilation) °Vomiting of blood or coffee ground material  °New, significant abdominal pain  °New, significant chest pain or pain under the shoulder blades  °Painful or persistently difficult swallowing  °New shortness of breath  °Black, tarry-looking or red, bloody stools ° °FOLLOW UP:  °If any biopsies were taken you will be contacted by phone or by letter within the next 1-3 weeks. Call [phone]  if you have not heard about the biopsies in 3 weeks.  °Please also call with any specific questions about appointments or follow up tests. ° °

## 2022-09-09 NOTE — H&P (Signed)
GASTROENTEROLOGY PROCEDURE H&P NOTE   Primary Care Physician: Velna Hatchet, MD  HPI: Logan Chambers is a 72 y.o. male who presents for EGD/EUS to evaluate HOP cyst and possible partial obstruction of the biliary tree.  For likely aspriation.  Past Medical History:  Diagnosis Date   Alcohol-induced pancreatitis    Arm fracture, left    Arthritis    Hypertension    Normocytic anemia    Pancreatic mass-cystic    Plantar fasciitis    Past Surgical History:  Procedure Laterality Date   IR PARACENTESIS  02/28/2022   Current Facility-Administered Medications  Medication Dose Route Frequency Provider Last Rate Last Admin   0.9 %  sodium chloride infusion   Intravenous Continuous Mansouraty, Telford Nab., MD       lactated ringers infusion   Intravenous Continuous Mansouraty, Telford Nab., MD 50 mL/hr at 09/09/22 0916 New Bag at 09/09/22 0916    Current Facility-Administered Medications:    0.9 %  sodium chloride infusion, , Intravenous, Continuous, Mansouraty, Telford Nab., MD   lactated ringers infusion, , Intravenous, Continuous, Mansouraty, Telford Nab., MD, Last Rate: 50 mL/hr at 09/09/22 0916, New Bag at 09/09/22 0916 No Known Allergies Family History  Problem Relation Age of Onset   Colon cancer Neg Hx    Rectal cancer Neg Hx    Stomach cancer Neg Hx    Pancreatic cancer Neg Hx    Liver disease Neg Hx    Social History   Socioeconomic History   Marital status: Single    Spouse name: Not on file   Number of children: 2   Years of education: Not on file   Highest education level: Not on file  Occupational History   Not on file  Tobacco Use   Smoking status: Former    Types: Cigarettes   Smokeless tobacco: Never   Tobacco comments:    Since 02/27/22  Vaping Use   Vaping Use: Never used  Substance and Sexual Activity   Alcohol use: Not Currently    Comment: 2 40 oz per day    Drug use: No   Sexual activity: Not on file  Other Topics Concern   Not on file   Social History Narrative   The patient lives alone, he has 2 supportive daughters in [place]   Former significant alcohol use and smoking history stopped 2023   Social Determinants of Health   Financial Resource Strain: Not on file  Food Insecurity: Not on file  Transportation Needs: Not on file  Physical Activity: Not on file  Stress: Not on file  Social Connections: Not on file  Intimate Partner Violence: Not on file    Physical Exam: Today's Vitals   09/02/22 1229 09/09/22 0911 09/09/22 0940  BP:  (!) 199/73 (!) 151/59  Pulse:  (!) 50   Temp:  (!) 97.2 F (36.2 C)   TempSrc:  Tympanic   SpO2:  100%   Weight: 68 kg 68 kg   Height:  '6\' 3"'$  (1.905 m)   PainSc:  0-No pain    Body mass index is 18.75 kg/m. GEN: NAD EYE: Sclerae anicteric ENT: MMM CV: Non-tachycardic GI: Soft, NT/ND NEURO:  Alert & Oriented x 3  Lab Results: No results for input(s): "WBC", "HGB", "HCT", "PLT" in the last 72 hours. BMET No results for input(s): "NA", "K", "CL", "CO2", "GLUCOSE", "BUN", "CREATININE", "CALCIUM" in the last 72 hours. LFT No results for input(s): "PROT", "ALBUMIN", "AST", "ALT", "ALKPHOS", "BILITOT", "BILIDIR", "IBILI" in the  last 72 hours. PT/INR No results for input(s): "LABPROT", "INR" in the last 72 hours.   Impression / Plan: This is a 72 y.o.male who presents for EGD/EUS to evaluate HOP cyst and possible partial obstruction of the biliary tree.  For likely aspriation.  The risks of an EUS including intestinal perforation, bleeding, infection, aspiration, and medication effects were discussed as was the possibility it may not give a definitive diagnosis if a biopsy is performed.  When a biopsy of the pancreas is done as part of the EUS, there is an additional risk of pancreatitis at the rate of about 1-2%.  It was explained that procedure related pancreatitis is typically mild, although it can be severe and even life threatening, which is why we do not perform  random pancreatic biopsies and only biopsy a lesion/area we feel is concerning enough to warrant the risk.  The risks and benefits of endoscopic evaluation/treatment were discussed with the patient and/or family; these include but are not limited to the risk of perforation, infection, bleeding, missed lesions, lack of diagnosis, severe illness requiring hospitalization, as well as anesthesia and sedation related illnesses.  The patient's history has been reviewed, patient examined, no change in status, and deemed stable for procedure.  The patient and/or family is agreeable to proceed.    Justice Britain, MD [facility] Office # [phone]

## 2022-09-10 LAB — CYTOLOGY - NON PAP

## 2022-09-11 ENCOUNTER — Encounter (HOSPITAL_COMMUNITY): Payer: Self-pay | Admitting: Gastroenterology

## 2022-09-11 LAB — SURGICAL PATHOLOGY

## 2022-09-13 ENCOUNTER — Encounter: Payer: Self-pay | Admitting: Gastroenterology

## 2022-09-17 ENCOUNTER — Encounter: Payer: Self-pay | Admitting: Gastroenterology

## 2022-09-17 NOTE — Progress Notes (Signed)
[facility] pancreatic fluid analysis  CEA 157 ng/mL Amylase 21,548 units/L  Overall, based on the fluid analysis, this looks like this is likely a pseudocyst versus an intramural pseudocyst.   Less likely that this is a mucinous cystic neoplasm or IPMN based on the CEA not being greater than 192.  Recommend repeat imaging in the next 6 months if not done sooner. Further recommendations as per EUS note.  We will forward these results to the patient's primary GI team.  Justice Britain, MD [facility] Office # [phone]

## 2022-09-20 NOTE — Progress Notes (Signed)
The patient has been notified of this information and all questions answered.  Staff message sent to Dr Celesta Aver nurse to set up CT in 6 months.  The pt will call if he has any symptoms prior to that time.

## 2022-09-27 NOTE — Progress Notes (Signed)
Pancreatic cyst fluid cytology Review from [facility] Diagnosis negative for malignancy.  We will have this result note scanned to the chart and also copied and sent to the patient's home for his records.  No change in plan of action as previously outlined.

## 2022-10-14 ENCOUNTER — Encounter: Payer: Self-pay | Admitting: Gastroenterology

## 2022-11-13 ENCOUNTER — Ambulatory Visit: Payer: 59 | Admitting: Internal Medicine

## 2022-12-10 ENCOUNTER — Ambulatory Visit (INDEPENDENT_AMBULATORY_CARE_PROVIDER_SITE_OTHER): Payer: 59 | Admitting: Internal Medicine

## 2022-12-10 ENCOUNTER — Other Ambulatory Visit (INDEPENDENT_AMBULATORY_CARE_PROVIDER_SITE_OTHER): Payer: 59

## 2022-12-10 ENCOUNTER — Encounter: Payer: Self-pay | Admitting: Internal Medicine

## 2022-12-10 VITALS — BP 120/64 | HR 70 | Ht 75.0 in | Wt 140.4 lb

## 2022-12-10 DIAGNOSIS — K838 Other specified diseases of biliary tract: Secondary | ICD-10-CM

## 2022-12-10 DIAGNOSIS — K862 Cyst of pancreas: Secondary | ICD-10-CM

## 2022-12-10 DIAGNOSIS — K860 Alcohol-induced chronic pancreatitis: Secondary | ICD-10-CM | POA: Diagnosis not present

## 2022-12-10 LAB — CBC WITH DIFFERENTIAL/PLATELET
Basophils Absolute: 0 10*3/uL (ref 0.0–0.1)
Basophils Relative: 0.6 % (ref 0.0–3.0)
Eosinophils Absolute: 0.5 10*3/uL (ref 0.0–0.7)
Eosinophils Relative: 10.9 % — ABNORMAL HIGH (ref 0.0–5.0)
HCT: 40 % (ref 39.0–52.0)
Hemoglobin: 13.9 g/dL (ref 13.0–17.0)
Lymphocytes Relative: 40.7 % (ref 12.0–46.0)
Lymphs Abs: 1.8 10*3/uL (ref 0.7–4.0)
MCHC: 34.6 g/dL (ref 30.0–36.0)
MCV: 93.7 fl (ref 78.0–100.0)
Monocytes Absolute: 0.5 10*3/uL (ref 0.1–1.0)
Monocytes Relative: 9.9 % (ref 3.0–12.0)
Neutro Abs: 1.7 10*3/uL (ref 1.4–7.7)
Neutrophils Relative %: 37.9 % — ABNORMAL LOW (ref 43.0–77.0)
Platelets: 222 10*3/uL (ref 150.0–400.0)
RBC: 4.27 Mil/uL (ref 4.22–5.81)
RDW: 15 % (ref 11.5–15.5)
WBC: 4.5 10*3/uL (ref 4.0–10.5)

## 2022-12-10 LAB — COMPREHENSIVE METABOLIC PANEL
ALT: 7 U/L (ref 0–53)
AST: 18 U/L (ref 0–37)
Albumin: 4.1 g/dL (ref 3.5–5.2)
Alkaline Phosphatase: 185 U/L — ABNORMAL HIGH (ref 39–117)
BUN: 5 mg/dL — ABNORMAL LOW (ref 6–23)
CO2: 28 mEq/L (ref 19–32)
Calcium: 9 mg/dL (ref 8.4–10.5)
Chloride: 96 mEq/L (ref 96–112)
Creatinine, Ser: 0.66 mg/dL (ref 0.40–1.50)
GFR: 93.97 mL/min (ref >60.00)
Glucose, Bld: 90 mg/dL (ref 70–99)
Potassium: 3.9 mEq/L (ref 3.5–5.1)
Sodium: 130 mEq/L — ABNORMAL LOW (ref 135–145)
Total Bilirubin: 0.5 mg/dL (ref 0.2–1.2)
Total Protein: 7.8 g/dL (ref 6.0–8.3)

## 2022-12-10 LAB — LIPASE: Lipase: 6 U/L — ABNORMAL LOW (ref 11.0–59.0)

## 2022-12-10 NOTE — Progress Notes (Addendum)
Logan Chambers 72 y.o. [birth year] [id number]  Assessment & Plan:   Encounter Diagnoses  Name Primary?   Alcohol-induced chronic pancreatitis Yes   Pancreatic cyst    Dilated bile duct    He seems to be doing well.  I recheck labs today as follows:  Lab Results  Component Value Date   WBC 4.5 12/10/2022   HGB 13.9 12/10/2022   HCT 40.0 12/10/2022   MCV 93.7 12/10/2022   PLT 222.0 12/10/2022     Chemistry      Component Value Date/Time   NA 130 (L) 12/10/2022 1605   K 3.9 12/10/2022 1605   CL 96 12/10/2022 1605   CO2 28 12/10/2022 1605   BUN 5 (L) 12/10/2022 1605   CREATININE 0.66 12/10/2022 1605      Component Value Date/Time   CALCIUM 9.0 12/10/2022 1605   ALKPHOS 185 (H) 12/10/2022 1605   AST 18 12/10/2022 1605   ALT 7 12/10/2022 1605   BILITOT 0.5 12/10/2022 1605     Lab Results  Component Value Date   LIPASE 6.0 (L) 12/10/2022    He will continue with abstinence.  I am going to have him repeat labs in 3 months. I have requested records of previous endoscopic procedures performed at [facility] so we can clarify his status.  Apparently his last colonoscopy was in 2019 and was normal.  He does not have stearrhea signs or symptoms stools are formed so no indication for pancreatic enzyme replacement.  01/03/2023  3 mm adenoma good prep 10/21 - recall colon 10/28  Subjective:   Chief Complaint: Follow-up of chronic pancreatitis  HPI 72 year old African-American man with alcoholic pancreatitis and a dilated bile duct who is here for follow-up after EUS and FNA/aspiration of pancreatic cyst September 09, 2022.  He feels like he is doing well at this point he is eating better.  He says he remains abstinent from alcohol.  Weights listed below I think the 150 pounds at the hospital was erroneous.  Every once a while he gets some very fleeting epigastric pain.  Wt Readings from Last 3 Encounters:  12/10/22 140 lb 6.4 oz (63.7 kg)  09/09/22 150 lb (68 kg)  08/01/22 141  lb (64 kg)   EUS 09/09/2022  EGD impression: - No gross lesions in the entire esophagus. Z-line irregular, 44 cm from the incisors. - 1 cm hiatal hernia. - Erythematous mucosa in the stomach. Biopsied. 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 Treated with argon plasma coagulation (APC). - No gross lesions in the duodenal bulb, in the first portion of the duodenum and in the second portion of the duodenum. - Normal major papilla though pointed significantly inferior.   EUS impression: - A cystic lesion was seen in the peripancreatic head region. Cytology results are pending. However, the endosonographic appearance is suggestive of a pancreatic pseudocyst versus intramural cyst versus inflammatory cyst. Fine needle aspiration for fluid performed. - The pancreatic duct had a dilated endosonographic appearance, had a prominently branched endosonographic appearance, had a tortuous/ectatic appearance and had hyperechoic walls in the pancreatic head, genu of the pancreas, body of the pancreas and tail of the pancreas. - Pancreatic parenchymal abnormalities consisting of atrophy, hyperechoic foci, lobularity with honeycombing and hyperechoic strands were noted in the entire pancreas. Fine needle biopsy performed in the head of the pancreas where the CBD and PD were noted to have a more significant dilation occur (though overt mass or lesion was not noted). - EUS is consistent with  chronic pancreatitis changes based on [place] criteria. - There was dilation in the common bile duct, in the common hepatic duct and in the gallbladder. - No malignant-appearing lymph nodes were visualized in the celiac region (level 20), peripancreatic region and porta hepatis region   CEA 157 Amylase > 21,000  Gastritis biopsies no H pylori No Known Allergies Past Medical History:  Diagnosis Date   Alcohol-induced pancreatitis    Arm fracture, left    Arthritis    Hypertension    Normocytic anemia    Pancreatic  mass-cystic    Plantar fasciitis    Past Surgical History:  Procedure Laterality Date   BIOPSY  09/09/2022   Procedure: BIOPSY;  Surgeon: Lemar Lofty., MD;  Location: [facility];  Service: Gastroenterology;;   ESOPHAGOGASTRODUODENOSCOPY N/A 09/09/2022   Procedure: ESOPHAGOGASTRODUODENOSCOPY (EGD);  Surgeon: Lemar Lofty., MD;  Location: [facility];  Service: Gastroenterology;  Laterality: N/A;   EUS N/A 09/09/2022   Procedure: UPPER ENDOSCOPIC ULTRASOUND (EUS) RADIAL;  Surgeon: Lemar Lofty., MD;  Location: [facility];  Service: Gastroenterology;  Laterality: N/A;   FINE NEEDLE ASPIRATION N/A 09/09/2022   Procedure: FINE NEEDLE ASPIRATION (FNA) LINEAR;  Surgeon: Lemar Lofty., MD;  Location: [facility];  Service: Gastroenterology;  Laterality: N/A;   HOT HEMOSTASIS N/A 09/09/2022   Procedure: HOT HEMOSTASIS (ARGON PLASMA COAGULATION/BICAP);  Surgeon: Lemar Lofty., MD;  Location: [facility];  Service: Gastroenterology;  Laterality: N/A;   IR PARACENTESIS  02/28/2022   Social History   Social History Narrative   The patient lives alone, he has 2 supportive daughters in [place]   Former significant alcohol use and smoking history stopped 2023   family history is not on file.   Review of Systems As above  Objective:   Physical Exam @BP  120/64   Pulse 70   Ht 6\' 3"  (1.905 m)   Wt 140 lb 6.4 oz (63.7 kg)   BMI 17.55 kg/m @  General:  NAD Eyes:   anicteric Lungs:  clear Heart::  S1S2 no rubs, murmurs or gallops Abdomen:  soft and nontender, BS+ Ext:   no edema, cyanosis or clubbing    Data Reviewed:  See HPI

## 2022-12-10 NOTE — Patient Instructions (Addendum)
_______________________________________________________  If your blood pressure at your visit was 140/90 or greater, please contact your primary care physician to follow up on this.  _______________________________________________________  If you are age 72 or older, your body mass index should be between 23-30. Your Body mass index is 17.55 kg/m. If this is out of the aforementioned range listed, please consider follow up with your Primary Care Provider.  If you are age 6 or younger, your body mass index should be between 19-25. Your Body mass index is 17.55 kg/m. If this is out of the aformentioned range listed, please consider follow up with your Primary Care Provider.   ________________________________________________________  The [facility] providers would like to encourage you to use [facility] to communicate with providers for non-urgent requests or questions.  Due to long hold times on the telephone, sending your provider a message by [facility] may be a faster and more efficient way to get a response.  Please allow 48 business hours for a response.  Please remember that this is for non-urgent requests.  _______________________________________________________  Your provider has requested that you go to the basement level for lab work before leaving today. Press "B" on the elevator. The lab is located at the first door on the left as you exit the elevator.  Due to recent changes in healthcare laws, you may see the results of your imaging and laboratory studies on MyChart before your provider has had a chance to review them.  We understand that in some cases there may be results that are confusing or concerning to you. Not all laboratory results come back in the same time frame and the provider may be waiting for multiple results in order to interpret others.  Please give [place] 48 hours in order for your provider to thoroughly review all the results before contacting the office for clarification of  your results.    It was a pleasure to see you today!  Thank you for trusting me with your gastrointestinal care!

## 2022-12-11 ENCOUNTER — Other Ambulatory Visit: Payer: Self-pay | Admitting: Internal Medicine

## 2022-12-11 ENCOUNTER — Telehealth: Payer: Self-pay | Admitting: Internal Medicine

## 2022-12-11 DIAGNOSIS — K860 Alcohol-induced chronic pancreatitis: Secondary | ICD-10-CM

## 2022-12-11 DIAGNOSIS — K838 Other specified diseases of biliary tract: Secondary | ICD-10-CM

## 2022-12-11 NOTE — Telephone Encounter (Signed)
Please contact the patient and tell him his labs showed improvement in the liver tests, his sodium or salt is slightly low which happens sometimes. I want to recheck labs in 3 months so I want him to come in early July.  I have already ordered the labs.  Please let him know to come the first week of July and we will recheck chemistries and pancreas test. He should remain off alcohol.

## 2022-12-12 NOTE — Telephone Encounter (Signed)
Patient informed of below.

## 2023-01-03 ENCOUNTER — Encounter: Payer: Self-pay | Admitting: Internal Medicine

## 2023-01-09 ENCOUNTER — Other Ambulatory Visit: Payer: Self-pay | Admitting: Internal Medicine

## 2023-01-09 DIAGNOSIS — F17200 Nicotine dependence, unspecified, uncomplicated: Secondary | ICD-10-CM

## 2023-02-12 ENCOUNTER — Ambulatory Visit
Admission: RE | Admit: 2023-02-12 | Discharge: 2023-02-12 | Disposition: A | Discharge status: Home / Self Care | Payer: 59 | Source: Ambulatory Visit | Attending: Internal Medicine | Admitting: Internal Medicine

## 2023-02-12 DIAGNOSIS — F17200 Nicotine dependence, unspecified, uncomplicated: Secondary | ICD-10-CM

## 2023-03-04 ENCOUNTER — Telehealth: Payer: Self-pay | Admitting: Acute Care

## 2023-03-04 NOTE — Telephone Encounter (Signed)
Please call PT w/screening results. A little hard to understand. Thanks.

## 2023-03-05 NOTE — Telephone Encounter (Signed)
Sorry- Looks like he was calling on a referral, Angelique Blonder. (Mush mouth and my bad hearing collided.) Please call PT. Thanks.

## 2023-03-05 NOTE — Telephone Encounter (Signed)
Spoke with pt regarding lung screening referral. Pt just had LDCT done through PCP that read as a LR2. Pt advised that we will contact him 1 year from this scan to see about scheduling SDMV and follow up LDCT. Pt verbalized understanding.

## 2023-03-21 ENCOUNTER — Other Ambulatory Visit: Payer: Self-pay

## 2023-03-21 ENCOUNTER — Telehealth: Payer: Self-pay

## 2023-03-21 DIAGNOSIS — K8689 Other specified diseases of pancreas: Secondary | ICD-10-CM

## 2023-03-21 NOTE — Telephone Encounter (Signed)
Spoke with patient & advised him of CT that is needed for 6 month follow up, along with lab work. He's aware schedulers have been notified & will contact him within 1 week for scheduling. He will call back if he has not heard from them. We discussed that he can come for labs at earliest convenience, no appointment needed and advised on when/where to go. Pt verbalized all understanding.

## 2023-03-21 NOTE — Telephone Encounter (Signed)
-----   Message from Nurse Patty P sent at 09/20/2022  9:35 AM EST ----- Repeat imaging study with CT abdomen pancreas protocol in 6 months under Dr. Marvell Fuller name.

## 2023-04-03 ENCOUNTER — Ambulatory Visit (HOSPITAL_COMMUNITY)
Admission: RE | Admit: 2023-04-03 | Discharge: 2023-04-03 | Disposition: A | Discharge status: Home / Self Care | Payer: 59 | Source: Ambulatory Visit | Attending: Internal Medicine | Admitting: Internal Medicine

## 2023-04-03 DIAGNOSIS — K8689 Other specified diseases of pancreas: Secondary | ICD-10-CM | POA: Diagnosis present

## 2023-04-03 MED ORDER — SODIUM CHLORIDE (PF) 0.9 % IJ SOLN
INTRAMUSCULAR | Status: AC
  Filled 2023-04-03: qty 50

## 2023-04-03 MED ORDER — IOHEXOL 300 MG/ML  SOLN
100.0000 mL | Freq: Once | INTRAMUSCULAR | Status: AC | PRN
  Administered 2023-04-03: 100 mL via INTRAVENOUS

## 2023-05-01 ENCOUNTER — Other Ambulatory Visit (INDEPENDENT_AMBULATORY_CARE_PROVIDER_SITE_OTHER): Payer: 59

## 2023-05-01 ENCOUNTER — Other Ambulatory Visit: Payer: Self-pay

## 2023-05-01 DIAGNOSIS — K862 Cyst of pancreas: Secondary | ICD-10-CM

## 2023-05-01 LAB — COMPREHENSIVE METABOLIC PANEL
ALT: 10 U/L (ref 0–53)
AST: 18 U/L (ref 0–37)
Albumin: 4.1 g/dL (ref 3.5–5.2)
Alkaline Phosphatase: 80 U/L (ref 39–117)
BUN: 4 mg/dL — ABNORMAL LOW (ref 6–23)
CO2: 27 meq/L (ref 19–32)
Calcium: 9.2 mg/dL (ref 8.4–10.5)
Chloride: 93 meq/L — ABNORMAL LOW (ref 96–112)
Creatinine, Ser: 0.67 mg/dL (ref 0.40–1.50)
GFR: 93.29 mL/min (ref >60.00)
Glucose, Bld: 78 mg/dL (ref 70–99)
Potassium: 3.4 meq/L — ABNORMAL LOW (ref 3.5–5.1)
Sodium: 127 meq/L — ABNORMAL LOW (ref 135–145)
Total Bilirubin: 0.7 mg/dL (ref 0.2–1.2)
Total Protein: 7.9 g/dL (ref 6.0–8.3)

## 2023-05-01 LAB — CBC
HCT: 39.7 % (ref 39.0–52.0)
Hemoglobin: 13.1 g/dL (ref 13.0–17.0)
MCHC: 33.1 g/dL (ref 30.0–36.0)
MCV: 99.7 fl (ref 78.0–100.0)
Platelets: 199 10*3/uL (ref 150.0–400.0)
RBC: 3.98 Mil/uL — ABNORMAL LOW (ref 4.22–5.81)
RDW: 14.1 % (ref 11.5–15.5)
WBC: 4.9 10*3/uL (ref 4.0–10.5)

## 2023-05-01 LAB — LIPASE: Lipase: 85 U/L — ABNORMAL HIGH (ref 11.0–59.0)

## 2023-11-05 NOTE — Progress Notes (Signed)
 GU Location of Tumor / Histology: Prostate Ca  If Prostate Cancer, Gleason Score is (3 + 4) and PSA is (9.83 on 06/23/2023)  Logan Chambers presented as referral from Dr. Berniece Salines [facility]) elevated PSA.  Biopsies       Past/Anticipated interventions by urology, if any:  Dr. Berniece Salines   Past/Anticipated interventions by medical oncology, if any: NA  Weight changes, if any:  No  IPSS:  11 SHIM:  12  Bowel/Bladder complaints, if any:  No  Nausea/Vomiting, if any: No  Pain issues, if any:  0/10  SAFETY ISSUES: Prior radiation?  No Pacemaker/ICD?  no Possible current pregnancy? Male Is the patient on methotrexate? No  Current Complaints / other details:

## 2023-11-10 NOTE — Progress Notes (Signed)
 Radiation Oncology         [phone] ________________________________  Initial Outpatient Consultation  Name: Logan Chambers MRN: [id number]  Date: 11/11/2023  DOB: [birth year]  CC:Alysia Penna, MD  Crist Fat, MD   REFERRING PHYSICIAN: Crist Fat, MD  DIAGNOSIS: 73 y.o. gentleman with Stage T1c adenocarcinoma of the prostate with Gleason score of 3+4, and PSA of 9.83.  No diagnosis found.  HISTORY OF PRESENT ILLNESS: Logan Chambers is a 73 y.o. male with a diagnosis of prostate cancer. He was initially referred to Dr. Marlou Porch back in 2022 for an elevated PSA of 9.74. He was treated for UTI, and his PSA initially dropped to 5.55. However, it has been rising again since that time, most recently to 9.83 in 05/2023. The patient proceeded to transrectal ultrasound with 12 biopsies of the prostate on 10/14/23.  The prostate volume measured 30.75 cc.  Out of 12 core biopsies, 8 were positive.  The maximum Gleason score was 3+, and this was seen in right mid lateral (small focus), right mid, right base, left mid, and left base. Additionally, Gleason 3+3 was seen in left base lateral, left mid lateral, and right apex lateral (small focus).  The patient reviewed the biopsy results with his urologist and he has kindly been referred today for discussion of potential radiation treatment options.   PREVIOUS RADIATION THERAPY: No  PAST MEDICAL HISTORY:  Past Medical History:  Diagnosis Date   Alcohol-induced pancreatitis    Arm fracture, left    Arthritis    Hx of adenomatous polyp of colon 06/12/2020   Hypertension    Normocytic anemia    Pancreatic mass-cystic    Plantar fasciitis       PAST SURGICAL HISTORY: Past Surgical History:  Procedure Laterality Date   BIOPSY  09/09/2022   Procedure: BIOPSY;  Surgeon: Lemar Lofty., MD;  Location: [facility];  Service: Gastroenterology;;   ESOPHAGOGASTRODUODENOSCOPY N/A 09/09/2022   Procedure:  ESOPHAGOGASTRODUODENOSCOPY (EGD);  Surgeon: Lemar Lofty., MD;  Location: [facility];  Service: Gastroenterology;  Laterality: N/A;   EUS N/A 09/09/2022   Procedure: UPPER ENDOSCOPIC ULTRASOUND (EUS) RADIAL;  Surgeon: Lemar Lofty., MD;  Location: [facility];  Service: Gastroenterology;  Laterality: N/A;   FINE NEEDLE ASPIRATION N/A 09/09/2022   Procedure: FINE NEEDLE ASPIRATION (FNA) LINEAR;  Surgeon: Lemar Lofty., MD;  Location: [facility];  Service: Gastroenterology;  Laterality: N/A;   HOT HEMOSTASIS N/A 09/09/2022   Procedure: HOT HEMOSTASIS (ARGON PLASMA COAGULATION/BICAP);  Surgeon: Lemar Lofty., MD;  Location: [facility];  Service: Gastroenterology;  Laterality: N/A;   IR PARACENTESIS  02/28/2022    FAMILY HISTORY:  Family History  Problem Relation Age of Onset   Colon cancer Neg Hx    Rectal cancer Neg Hx    Stomach cancer Neg Hx    Pancreatic cancer Neg Hx    Liver disease Neg Hx     SOCIAL HISTORY:  Social History   Socioeconomic History   Marital status: Single    Spouse name: Not on file   Number of children: 2   Years of education: Not on file   Highest education level: Not on file  Occupational History   Not on file  Tobacco Use   Smoking status: Former    Types: Cigarettes   Smokeless tobacco: Never   Tobacco comments:    Since 02/27/22  Vaping Use   Vaping status: Never Used  Substance and Sexual Activity   Alcohol use: Not  Currently    Comment: 2 40 oz per day    Drug use: No   Sexual activity: Not on file  Other Topics Concern   Not on file  Social History Narrative   The patient lives alone, he has 2 supportive daughters in [place]   Former significant alcohol use and smoking history stopped 2023   Social Drivers of Health   Financial Resource Strain: Not on file  Food Insecurity: Not on file  Transportation Needs: Not on file  Physical Activity: Not on file  Stress: Not on file  Social  Connections: Not on file  Intimate Partner Violence: Not on file    ALLERGIES: Lisinopril  MEDICATIONS:  Current Outpatient Medications  Medication Sig Dispense Refill   pantoprazole (PROTONIX) 40 MG tablet Take 1 tablet (40 mg total) by mouth daily. 30 tablet 1   No current facility-administered medications for this encounter.    REVIEW OF SYSTEMS:  On review of systems, the patient reports that he is doing well overall. He denies any chest pain, shortness of breath, cough, fevers, chills, night sweats, unintended weight changes. He denies any bowel disturbances, and denies abdominal pain, nausea or vomiting. He denies any new musculoskeletal or joint aches or pains. His IPSS was ***, indicating *** urinary symptoms. His SHIM was ***, indicating he {does not have/has mild/moderate/severe} erectile dysfunction. A complete review of systems is obtained and is otherwise negative.    PHYSICAL EXAM:  Wt Readings from Last 3 Encounters:  12/10/22 140 lb 6.4 oz (63.7 kg)  09/09/22 150 lb (68 kg)  08/01/22 141 lb (64 kg)   Temp Readings from Last 3 Encounters:  09/09/22 97.6 F (36.4 C) (Temporal)  03/07/22 98.3 F (36.8 C) (Oral)  02/16/22 (!) 97.5 F (36.4 C)   BP Readings from Last 3 Encounters:  12/10/22 120/64  09/09/22 (!) 149/64  08/01/22 124/76   Pulse Readings from Last 3 Encounters:  12/10/22 70  09/09/22 65  08/01/22 (!) 56    /10  In general this is a well appearing *** male in no acute distress. He's alert and oriented x4 and appropriate throughout the examination. Cardiopulmonary assessment is negative for acute distress, and he exhibits normal effort.     KPS = ***  100 - Normal; no complaints; no evidence of disease. 90   - Able to carry on normal activity; minor signs or symptoms of disease. 80   - Normal activity with effort; some signs or symptoms of disease. 57   - Cares for self; unable to carry on normal activity or to do active work. 60   - Requires  occasional assistance, but is able to care for most of his personal needs. 50   - Requires considerable assistance and frequent medical care. 40   - Disabled; requires special care and assistance. 30   - Severely disabled; hospital admission is indicated although death not imminent. 20   - Very sick; hospital admission necessary; active supportive treatment necessary. 10   - Moribund; fatal processes progressing rapidly. 0     - Dead  Karnofsky DA, Abelmann WH, Craver LS and Burchenal [facility] [phone]) The use of the nitrogen mustards in the palliative treatment of carcinoma: with particular reference to bronchogenic carcinoma Cancer 1 634-56  LABORATORY DATA:  Lab Results  Component Value Date   WBC 4.9 05/01/2023   HGB 13.1 05/01/2023   HCT 39.7 05/01/2023   MCV 99.7 05/01/2023   PLT 199.0 05/01/2023   Lab Results  Component Value Date   NA 127 (L) 05/01/2023   K 3.4 (L) 05/01/2023   CL 93 (L) 05/01/2023   CO2 27 05/01/2023   Lab Results  Component Value Date   ALT 10 05/01/2023   AST 18 05/01/2023   ALKPHOS 80 05/01/2023   BILITOT 0.7 05/01/2023     RADIOGRAPHY: No results found.    IMPRESSION/PLAN: 1. 73 y.o. gentleman with Stage T1c adenocarcinoma of the prostate with Gleason Score of 3+4, and PSA of 9.83. We discussed the patient's workup and outlined the nature of prostate cancer in this setting. The patient's T stage, Gleason's score, and PSA put him into the favorable intermediate risk group. Accordingly, he is eligible for a variety of potential treatment options including brachytherapy, 5.5 weeks of external radiation, or prostatectomy. We discussed the available radiation techniques, and focused on the details and logistics of delivery. We discussed and outlined the risks, benefits, short and long-term effects associated with radiotherapy and compared and contrasted these with prostatectomy. We discussed the role of SpaceOAR gel in reducing the rectal toxicity associated  with radiotherapy. He appears to have a good understanding of his disease and our treatment recommendations which are of curative intent.  He was encouraged to ask questions that were answered to his stated satisfaction.  At the conclusion of our conversation, the patient is interested in moving forward with ***.  We personally spent *** minutes in this encounter including chart review, reviewing radiological studies, meeting face-to-face with the patient, entering orders and completing documentation.    Marguarite Arbour, PA-C    Margaretmary Dys, MD  [facility]  Radiation Oncology Direct Dial: [phone]  Fax: [phone] [email or web address]  Skype  [email or web address]   This document serves as a record of services personally performed by Margaretmary Dys, MD and Marcello Fennel, PA-C. It was created on their behalf by [name], a trained medical scribe. The creation of this record is based on the scribe's personal observations and the provider's statements to them. This document has been checked and approved by the attending provider.

## 2023-11-11 ENCOUNTER — Ambulatory Visit
Admission: RE | Admit: 2023-11-11 | Discharge: 2023-11-11 | Disposition: A | Discharge status: Home / Self Care | Source: Ambulatory Visit | Attending: Radiation Oncology

## 2023-11-11 ENCOUNTER — Ambulatory Visit
Admission: RE | Admit: 2023-11-11 | Discharge: 2023-11-11 | Disposition: A | Discharge status: Home / Self Care | Source: Ambulatory Visit | Attending: Radiation Oncology | Admitting: Radiation Oncology

## 2023-11-11 ENCOUNTER — Encounter: Payer: Self-pay | Admitting: Radiation Oncology

## 2023-11-11 VITALS — BP 123/87 | HR 75 | Temp 97.8°F | Resp 18 | Ht 74.0 in | Wt 136.4 lb

## 2023-11-11 DIAGNOSIS — Z860101 Personal history of adenomatous and serrated colon polyps: Secondary | ICD-10-CM | POA: Insufficient documentation

## 2023-11-11 DIAGNOSIS — I10 Essential (primary) hypertension: Secondary | ICD-10-CM | POA: Insufficient documentation

## 2023-11-11 DIAGNOSIS — Z87440 Personal history of urinary (tract) infections: Secondary | ICD-10-CM | POA: Insufficient documentation

## 2023-11-11 DIAGNOSIS — Z79899 Other long term (current) drug therapy: Secondary | ICD-10-CM | POA: Diagnosis not present

## 2023-11-11 DIAGNOSIS — D649 Anemia, unspecified: Secondary | ICD-10-CM | POA: Insufficient documentation

## 2023-11-11 DIAGNOSIS — C61 Malignant neoplasm of prostate: Secondary | ICD-10-CM | POA: Diagnosis present

## 2023-11-11 DIAGNOSIS — K860 Alcohol-induced chronic pancreatitis: Secondary | ICD-10-CM | POA: Insufficient documentation

## 2023-11-11 DIAGNOSIS — F17210 Nicotine dependence, cigarettes, uncomplicated: Secondary | ICD-10-CM | POA: Insufficient documentation

## 2023-11-11 DIAGNOSIS — M722 Plantar fascial fibromatosis: Secondary | ICD-10-CM | POA: Insufficient documentation

## 2023-11-11 NOTE — Progress Notes (Signed)
 Introduced myself to the patient as the prostate nurse navigator.  He is here to discuss his radiation treatment options and will proceed with brachytherapy.  I gave him my business card and asked him to call me with questions or concerns.  Verbalized understanding.

## 2023-11-19 ENCOUNTER — Telehealth: Payer: Self-pay | Admitting: *Deleted

## 2023-11-19 NOTE — Telephone Encounter (Signed)
CALLED PATIENT TO UPDATE, SPOKE WITH PATIENT 

## 2023-11-21 ENCOUNTER — Other Ambulatory Visit: Payer: Self-pay | Admitting: Urology

## 2023-11-28 ENCOUNTER — Telehealth: Payer: Self-pay | Admitting: *Deleted

## 2023-11-28 NOTE — Telephone Encounter (Signed)
CALLED PATIENT TO INFORM OF PRE-SEED APPTS. AND IMPLANT DATE, LVM FOR A RETURN CALL 

## 2023-12-02 ENCOUNTER — Telehealth: Payer: Self-pay | Admitting: *Deleted

## 2023-12-02 NOTE — Telephone Encounter (Signed)
 CALLED PATIENT TO REMIND OF PRE-SEED APPTS. FOR 12-04-23, SPOKE WITH PATIENT AND HE IS AWARE OF THESE APPTS.

## 2023-12-03 NOTE — Progress Notes (Signed)
 Radiation Oncology         [phone] ________________________________  Outpatient Follow up- Pre-seed visit  Name: Logan Chambers MRN: [id number]  Date: 12/04/2023  DOB: 1951/05/22  CC:Alysia Penna, MD  Logan Fat, MD   REFERRING PHYSICIAN: Crist Fat, MD  DIAGNOSIS: 73 y.o. gentleman with Stage T1c adenocarcinoma of the prostate with Gleason score of 3+4, and PSA of 9.83.     ICD-10-CM   1. Malignant neoplasm of prostate (HCC)  C61       HISTORY OF PRESENT ILLNESS: Logan Chambers is a 73 y.o. male with a diagnosis of prostate cancer. He was initially referred to Dr. Marlou Porch back in 2022 for an elevated PSA of 9.74. He was treated for UTI, and his PSA initially decreased to 5.55. His PSA remained stable in the 7s throughout 2023 but began rising again, at 8.38 in 12/2022 and most recently up to 9.83 in 05/2023. The patient proceeded to transrectal ultrasound with 12 biopsies of the prostate on 10/14/23.  The prostate volume measured 30.75 cc.  Out of 12 core biopsies, 8 were positive.  The maximum Gleason score was 3+ 4, and this was seen in the right mid lateral (small focus), right mid, right base, left mid, and left base. Additionally, Gleason 3+3 was seen in the left base lateral, left mid lateral, and right apex lateral (small focus).   The patient reviewed the biopsy results with his urologist and was kindly referred to [place] for discussion of potential radiation treatment options. We initially met the patient on 11/11/23 and he was most interested in proceeding with brachytherapy and SpaceOAR gel placement for treatment of his disease. He is here today for his pre-procedure imaging for planning and to answer any additional questions he may have about this treatment.   PREVIOUS RADIATION THERAPY: No  PAST MEDICAL HISTORY:  Past Medical History:  Diagnosis Date   Alcohol-induced pancreatitis    Arm fracture, left    Arthritis    Hx of adenomatous polyp of colon  06/12/2020   Hypertension    Normocytic anemia    Pancreatic mass-cystic    Plantar fasciitis       PAST SURGICAL HISTORY: Past Surgical History:  Procedure Laterality Date   BIOPSY  09/09/2022   Procedure: BIOPSY;  Surgeon: Logan Chambers., MD;  Location: [facility];  Service: Gastroenterology;;   ESOPHAGOGASTRODUODENOSCOPY N/A 09/09/2022   Procedure: ESOPHAGOGASTRODUODENOSCOPY (EGD);  Surgeon: Logan Chambers., MD;  Location: [facility];  Service: Gastroenterology;  Laterality: N/A;   EUS N/A 09/09/2022   Procedure: UPPER ENDOSCOPIC ULTRASOUND (EUS) RADIAL;  Surgeon: Logan Chambers., MD;  Location: [facility];  Service: Gastroenterology;  Laterality: N/A;   FINE NEEDLE ASPIRATION N/A 09/09/2022   Procedure: FINE NEEDLE ASPIRATION (FNA) LINEAR;  Surgeon: Logan Chambers., MD;  Location: [facility];  Service: Gastroenterology;  Laterality: N/A;   HOT HEMOSTASIS N/A 09/09/2022   Procedure: HOT HEMOSTASIS (ARGON PLASMA COAGULATION/BICAP);  Surgeon: Logan Chambers., MD;  Location: [facility];  Service: Gastroenterology;  Laterality: N/A;   IR PARACENTESIS  02/28/2022   PROSTATE BIOPSY      FAMILY HISTORY:  Family History  Problem Relation Age of Onset   Colon cancer Neg Hx    Rectal cancer Neg Hx    Stomach cancer Neg Hx    Pancreatic cancer Neg Hx    Liver disease Neg Hx     SOCIAL HISTORY:  Social History   Socioeconomic History   Marital status: Single  Spouse name: Not on file   Number of children: 2   Years of education: Not on file   Highest education level: Not on file  Occupational History   Not on file  Tobacco Use   Smoking status: Every Day    Types: Cigarettes   Smokeless tobacco: Never   Tobacco comments:    Since 02/27/22  Vaping Use   Vaping status: Never Used  Substance and Sexual Activity   Alcohol use: Yes    Comment: 2 40 oz per day    Drug use: No   Sexual activity: Not on file  Other Topics  Concern   Not on file  Social History Narrative   The patient lives alone, he has 2 supportive daughters in [place]   Former significant alcohol use and smoking history stopped 2023   Social Drivers of Health   Financial Resource Strain: Not on file  Food Insecurity: No Food Insecurity (11/11/2023)   Hunger Vital Sign    Worried About Running Out of Food in the Last Year: Never true    Ran Out of Food in the Last Year: Never true  Transportation Needs: No Transportation Needs (11/11/2023)   PRAPARE - Administrator, Civil Service (Medical): No    Lack of Transportation (Non-Medical): No  Physical Activity: Not on file  Stress: Not on file  Social Connections: Not on file  Intimate Partner Violence: Not At Risk (11/11/2023)   Humiliation, Afraid, Rape, and Kick questionnaire    Fear of Current or Ex-Partner: No    Emotionally Abused: No    Physically Abused: No    Sexually Abused: No    ALLERGIES: Lisinopril  MEDICATIONS:  Current Outpatient Medications  Medication Sig Dispense Refill   latanoprost (XALATAN) 0.005 % ophthalmic solution Place 1 drop into both eyes at bedtime.     pantoprazole (PROTONIX) 40 MG tablet Take 1 tablet (40 mg total) by mouth daily. 30 tablet 1   No current facility-administered medications for this encounter.    REVIEW OF SYSTEMS:  On review of systems, the patient reports that he is doing well overall. He denies any chest pain, shortness of breath, cough, fevers, chills, night sweats, unintended weight changes. He denies any bowel disturbances, and denies abdominal pain, nausea or vomiting. He denies any new musculoskeletal or joint aches or pains. His IPSS was 11, indicating moderate urinary symptoms with some urgency and intermittency which are not bothersome enough to warrant medication. His SHIM was 12, indicating he has moderate erectile dysfunction. A complete review of systems is obtained and is otherwise negative.     PHYSICAL  EXAM:  Wt Readings from Last 3 Encounters:  11/11/23 136 lb 6.4 oz (61.9 kg)  12/10/22 140 lb 6.4 oz (63.7 kg)  09/09/22 150 lb (68 kg)   Temp Readings from Last 3 Encounters:  11/11/23 97.8 F (36.6 C)  09/09/22 97.6 F (36.4 C) (Temporal)  03/07/22 98.3 F (36.8 C) (Oral)   BP Readings from Last 3 Encounters:  11/11/23 123/87  12/10/22 120/64  09/09/22 (!) 149/64   Pulse Readings from Last 3 Encounters:  11/11/23 75  12/10/22 70  09/09/22 65    /10  In general this is a well appearing African American male in no acute distress. He's alert and oriented x4 and appropriate throughout the examination. Cardiopulmonary assessment is negative for acute distress, and he exhibits normal effort.     KPS = 90  100 - Normal; no complaints; no  evidence of disease. 90   - Able to carry on normal activity; minor signs or symptoms of disease. 80   - Normal activity with effort; some signs or symptoms of disease. 11   - Cares for self; unable to carry on normal activity or to do active work. 60   - Requires occasional assistance, but is able to care for most of his personal needs. 50   - Requires considerable assistance and frequent medical care. 40   - Disabled; requires special care and assistance. 30   - Severely disabled; hospital admission is indicated although death not imminent. 20   - Very sick; hospital admission necessary; active supportive treatment necessary. 10   - Moribund; fatal processes progressing rapidly. 0     - Dead  Karnofsky DA, Abelmann WH, Craver LS and Burchenal [facility] [phone]) The use of the nitrogen mustards in the palliative treatment of carcinoma: with particular reference to bronchogenic carcinoma Cancer 1 634-56  LABORATORY DATA:  Lab Results  Component Value Date   WBC 4.9 05/01/2023   HGB 13.1 05/01/2023   HCT 39.7 05/01/2023   MCV 99.7 05/01/2023   PLT 199.0 05/01/2023   Lab Results  Component Value Date   NA 127 (L) 05/01/2023   K 3.4 (L)  05/01/2023   CL 93 (L) 05/01/2023   CO2 27 05/01/2023   Lab Results  Component Value Date   ALT 10 05/01/2023   AST 18 05/01/2023   ALKPHOS 80 05/01/2023   BILITOT 0.7 05/01/2023     RADIOGRAPHY: No results found.    IMPRESSION/PLAN: 1. 73 y.o. gentleman with Stage T1c adenocarcinoma of the prostate with Gleason score of 3+4, and PSA of 9.83.  The patient has elected to proceed with seed implant for treatment of his disease. We reviewed the risks, benefits, short and long-term effects associated with brachytherapy and discussed the role of SpaceOAR in reducing the rectal toxicity associated with radiotherapy.  He appears to have a good understanding of his disease and our treatment recommendations which are of curative intent.  He was encouraged to ask questions that were answered to his stated satisfaction. He has freely signed written consent to proceed today in the office and a copy of this document will be placed in his medical record. His procedure is tentatively scheduled for 12/26/23 in collaboration with Dr. Marlou Porch and we will see him back for his post-procedure visit approximately 3 weeks thereafter. We look forward to continuing to participate in his care. He knows that he is welcome to call with any questions or concerns at any time in the interim.  I personally spent 30 minutes in this encounter including chart review, reviewing radiological studies, meeting face-to-face with the patient, entering orders and completing documentation.    Marguarite Arbour, MMS, PA-C [place]  [facility] Radiation Oncology Physician Assistant Direct Dial: [phone]  Fax: [phone]

## 2023-12-03 NOTE — Progress Notes (Signed)
 Pre-seed nursing interview for a diagnosis of Stage T1c adenocarcinoma of the prostate with Gleason score of 3+4, and PSA of 9.83.  Patient identity verified x2.   Patient reports doing well. No issues conveyed at this time.  Meaningful use complete.  Urinary Management medication(s)- None Urology appointment date- 12/26/2023, with Dr. Marlou Porch at [facility].  No vitals needed for this visit.  This concludes the interaction.  Ruel Favors, LPN

## 2023-12-03 NOTE — Progress Notes (Signed)
  Radiation Oncology         [phone] ________________________________  Name: Logan Chambers MRN: [id number]  Date: 12/04/2023  DOB: [birth year]  SIMULATION AND TREATMENT PLANNING NOTE PUBIC ARCH STUDY  MV:HQIONGEX, Geralyn Knee, MD  Andrez Banker, MD  DIAGNOSIS: 73 y.o. gentleman with Stage T1c adenocarcinoma of the prostate with Gleason score of 3+4, and PSA of 9.83.   Oncology History  Malignant neoplasm of prostate (HCC)  10/14/2023 Cancer Staging   Staging form: Prostate, AJCC 8th Edition - Clinical stage from 10/14/2023: Stage IIB (cT1c, cN0, cM0, PSA: 9.8, Grade Group: 2) - Signed by Keitha Pata, PA-C on 11/11/2023 Histopathologic type: Adenocarcinoma, NOS Stage prefix: Initial diagnosis Prostate specific antigen (PSA) range: Less than 10 Gleason primary pattern: 3 Gleason secondary pattern: 4 Gleason score: 7 Histologic grading system: 5 grade system Number of biopsy cores examined: 12 Number of biopsy cores positive: 8 Location of positive needle core biopsies: Both sides   11/11/2023 Initial Diagnosis   Malignant neoplasm of prostate (HCC)       ICD-10-CM   1. Malignant neoplasm of prostate (HCC)  C61       COMPLEX SIMULATION:  The patient presented today for evaluation for possible prostate seed implant. He was brought to the radiation planning suite and placed supine on the CT couch. A 3-dimensional image study set was obtained in upload to the planning computer. There, on each axial slice, I contoured the prostate gland. Then, using three-dimensional radiation planning tools I reconstructed the prostate in view of the structures from the transperineal needle pathway to assess for possible pubic arch interference. In doing so, I did not appreciate any pubic arch interference. Also, the patient's prostate volume was estimated based on the drawn structure. The volume was 30 cc.  Given the pubic arch appearance and prostate volume, patient remains a good candidate to  proceed with prostate seed implant. Today, he freely provided informed written consent to proceed.    PLAN: The patient will undergo prostate seed implant.   ________________________________  Trilby Fujisawa. Lorri Rota, M.D.

## 2023-12-04 ENCOUNTER — Ambulatory Visit
Admission: RE | Admit: 2023-12-04 | Discharge: 2023-12-04 | Disposition: A | Discharge status: Home / Self Care | Payer: Self-pay | Source: Ambulatory Visit | Attending: Urology | Admitting: Urology

## 2023-12-04 ENCOUNTER — Ambulatory Visit
Admission: RE | Admit: 2023-12-04 | Discharge: 2023-12-04 | Disposition: A | Discharge status: Home / Self Care | Source: Ambulatory Visit | Attending: Radiation Oncology | Admitting: Radiation Oncology

## 2023-12-04 ENCOUNTER — Encounter: Payer: Self-pay | Admitting: Urology

## 2023-12-04 VITALS — Ht 74.0 in | Wt 138.0 lb

## 2023-12-04 DIAGNOSIS — C61 Malignant neoplasm of prostate: Secondary | ICD-10-CM | POA: Diagnosis present

## 2023-12-16 NOTE — Patient Instructions (Signed)
 DUE TO COVID-19 ONLY TWO VISITORS  (aged 73 and older)  ARE ALLOWED TO COME WITH YOU AND STAY IN THE WAITING ROOM ONLY DURING PRE OP AND PROCEDURE.   **NO VISITORS ARE ALLOWED IN THE SHORT STAY AREA OR RECOVERY ROOM!!**  IF YOU WILL BE ADMITTED INTO THE HOSPITAL YOU ARE ALLOWED ONLY FOUR SUPPORT PEOPLE DURING VISITATION HOURS ONLY (7 AM -8PM)   The support person(s) must pass our screening, gel in and out, and wear a mask at all times, including in the patient's room. Patients must also wear a mask when staff or their support person are in the room. Visitors GUEST BADGE MUST BE WORN VISIBLY  One adult visitor may remain with you overnight and MUST be in the room by 8 P.M.     Your procedure is scheduled on: 12/26/23   Report to [facility] Main Entrance    Report to admitting at : 9:15 AM   Call this number if you have problems the morning of surgery [phone]   Do not eat food :After Midnight.   After Midnight you may have the following liquids until : 8:30 AM DAY OF SURGERY  Water Black Coffee (sugar ok, NO MILK/CREAM OR CREAMERS)  Tea (sugar ok, NO MILK/CREAM OR CREAMERS) regular and decaf                             Plain Jell-O (NO RED)                                           Fruit ices (not with fruit pulp, NO RED)                                     Popsicles (NO RED)                                                                  Juice: apple, WHITE grape, WHITE cranberry Sports drinks like Gatorade (NO RED)              FOLLOW BOWEL PREP AND ANY ADDITIONAL PRE OP INSTRUCTIONS YOU RECEIVED FROM YOUR SURGEON'S OFFICE!!!   Oral Hygiene is also important to reduce your risk of infection.                                    Remember - BRUSH YOUR TEETH THE MORNING OF SURGERY WITH YOUR REGULAR TOOTHPASTE  DENTURES WILL BE REMOVED PRIOR TO SURGERY PLEASE DO NOT APPLY "Poly grip" OR ADHESIVES!!!   Do NOT smoke after Midnight   Take these medicines the morning of  surgery with A SIP OF WATER: pantoprazole .                              You may not have any metal on your body including hair pins, jewelry, and body piercing  Do not wear lotions, powders, perfumes/cologne, or deodorant              Men may shave face and neck.   Do not bring valuables to the hospital. [place] IS NOT             RESPONSIBLE   FOR VALUABLES.   Contacts, glasses, or bridgework may not be worn into surgery.   Bring small overnight bag day of surgery.   DO NOT BRING YOUR HOME MEDICATIONS TO THE HOSPITAL. PHARMACY WILL DISPENSE MEDICATIONS LISTED ON YOUR MEDICATION LIST TO YOU DURING YOUR ADMISSION IN THE HOSPITAL!    Patients discharged on the day of surgery will not be allowed to drive home.  Someone NEEDS to stay with you for the first 24 hours after anesthesia.   Special Instructions: Bring a copy of your healthcare power of attorney and living will documents         the day of surgery if you haven't scanned them before.              Please read over the following fact sheets you were given: IF YOU HAVE QUESTIONS ABOUT YOUR PRE-OP INSTRUCTIONS PLEASE CALL [phone]    [facility] - Preparing for Surgery Before surgery, you can play an important role.  Because skin is not sterile, your skin needs to be as free of germs as possible.  You can reduce the number of germs on your skin by washing with CHG (chlorahexidine gluconate) soap before surgery.  CHG is an antiseptic cleaner which kills germs and bonds with the skin to continue killing germs even after washing. Please DO NOT use if you have an allergy to CHG or antibacterial soaps.  If your skin becomes reddened/irritated stop using the CHG and inform your nurse when you arrive at Short Stay. Do not shave (including legs and underarms) for at least 48 hours prior to the first CHG shower.  You may shave your face/neck. Please follow these instructions carefully:  1.  Shower with CHG Soap the night  before surgery and the  morning of Surgery.  2.  If you choose to wash your hair, wash your hair first as usual with your  normal  shampoo.  3.  After you shampoo, rinse your hair and body thoroughly to remove the  shampoo.                           4.  Use CHG as you would any other liquid soap.  You can apply chg directly  to the skin and wash                       Gently with a scrungie or clean washcloth.  5.  Apply the CHG Soap to your body ONLY FROM THE NECK DOWN.   Do not use on face/ open                           Wound or open sores. Avoid contact with eyes, ears mouth and genitals (private parts).                       Wash face,  Genitals (private parts) with your normal soap.             6.  Wash thoroughly, paying special attention to the area where your surgery  will be performed.  7.  Thoroughly rinse your body with warm water from the neck down.  8.  DO NOT shower/wash with your normal soap after using and rinsing off  the CHG Soap.                9.  Pat yourself dry with a clean towel.            10.  Wear clean pajamas.            11.  Place clean sheets on your bed the night of your first shower and do not  sleep with pets. Day of Surgery : Do not apply any lotions/deodorants the morning of surgery.  Please wear clean clothes to the hospital/surgery center.  FAILURE TO FOLLOW THESE INSTRUCTIONS MAY RESULT IN THE CANCELLATION OF YOUR SURGERY PATIENT SIGNATURE_________________________________  NURSE SIGNATURE__________________________________  ________________________________________________________________________

## 2023-12-17 ENCOUNTER — Encounter (HOSPITAL_COMMUNITY): Payer: Self-pay

## 2023-12-17 ENCOUNTER — Encounter (HOSPITAL_COMMUNITY)
Admission: RE | Admit: 2023-12-17 | Discharge: 2023-12-17 | Disposition: A | Discharge status: Home / Self Care | Source: Ambulatory Visit | Attending: Urology | Admitting: Urology

## 2023-12-17 ENCOUNTER — Other Ambulatory Visit: Payer: Self-pay

## 2023-12-17 DIAGNOSIS — I10 Essential (primary) hypertension: Secondary | ICD-10-CM | POA: Diagnosis not present

## 2023-12-17 DIAGNOSIS — Z01818 Encounter for other preprocedural examination: Secondary | ICD-10-CM | POA: Insufficient documentation

## 2023-12-17 HISTORY — DX: Malignant (primary) neoplasm, unspecified: C80.1

## 2023-12-17 LAB — CBC
HCT: 35.4 % — ABNORMAL LOW (ref 39.0–52.0)
Hemoglobin: 12.3 g/dL — ABNORMAL LOW (ref 13.0–17.0)
MCH: 35 pg — ABNORMAL HIGH (ref 26.0–34.0)
MCHC: 34.7 g/dL (ref 30.0–36.0)
MCV: 100.9 fL — ABNORMAL HIGH (ref 80.0–100.0)
Platelets: 246 10*3/uL (ref 150–400)
RBC: 3.51 MIL/uL — ABNORMAL LOW (ref 4.22–5.81)
RDW: 13.2 % (ref 11.5–15.5)
WBC: 4.3 10*3/uL (ref 4.0–10.5)
nRBC: 0 % (ref 0.0–0.2)

## 2023-12-17 LAB — BASIC METABOLIC PANEL WITH GFR
Anion gap: 13 (ref 5–15)
BUN: <5 mg/dL — ABNORMAL LOW (ref 8–23)
CO2: 20 mmol/L — ABNORMAL LOW (ref 22–32)
Calcium: 8.9 mg/dL (ref 8.9–10.3)
Chloride: 92 mmol/L — ABNORMAL LOW (ref 98–111)
Creatinine, Ser: 0.55 mg/dL — ABNORMAL LOW (ref 0.61–1.24)
GFR, Estimated: >60 mL/min (ref >60)
Glucose, Bld: 82 mg/dL (ref 70–99)
Potassium: 3.9 mmol/L (ref 3.5–5.1)
Sodium: 125 mmol/L — ABNORMAL LOW (ref 135–145)

## 2023-12-17 NOTE — Progress Notes (Signed)
Lab. Results: Sodium: 125.

## 2023-12-17 NOTE — Progress Notes (Signed)
 For Anesthesia: PCP - Barnetta Liberty, MD  Cardiologist - N/A  Bowel Prep reminder:  Chest x-ray - CT Chest: 02/17/23 EKG - 12/17/23 Stress Test -  ECHO -  Cardiac Cath -  Pacemaker/ICD device last checked: Pacemaker orders received: Device Rep notified:  Spinal Cord Stimulator:N/A  Sleep Study - N/A CPAP -   Fasting Blood Sugar - N/A Checks Blood Sugar _____ times a day Date and result of last Hgb A1c-  Last dose of GLP1 agonist- N/A GLP1 instructions:   Last dose of SGLT-2 inhibitors- N/A SGLT-2 instructions:   Blood Thinner Instructions:N/A Aspirin Instructions: Last Dose:  Activity level: Can go up a flight of stairs and activities of daily living without stopping and without chest pain and/or shortness of breath   Able to exercise without chest pain and/or shortness of breath  Anesthesia review: Hx: HTN,Smoker.  Patient denies shortness of breath, fever, cough and chest pain at PAT appointment   Patient verbalized understanding of instructions that were given to them at the PAT appointment. Patient was also instructed that they will need to review over the PAT instructions again at home before surgery.

## 2023-12-19 NOTE — Progress Notes (Signed)
 RN spoke with patient to review any questions prior to upcoming brachytherapy on 5/2.  Patient denies any questions at this time. RN provided education on next steps post brachytherapy.  No additional needs at this time. Plan of care in progress.

## 2023-12-25 ENCOUNTER — Telehealth: Payer: Self-pay | Admitting: *Deleted

## 2023-12-25 NOTE — Telephone Encounter (Signed)
 Called patient to remind of procedure for 12-26-23, spoke with patient and he is aware of this procedure

## 2023-12-25 NOTE — Anesthesia Preprocedure Evaluation (Addendum)
 Anesthesia Evaluation  Patient identified by MRN, date of birth, ID band Patient awake    Reviewed: Allergy & Precautions, NPO status , Patient's Chart, lab work & pertinent test results  History of Anesthesia Complications Negative for: history of anesthetic complications  Airway Mallampati: II  TM Distance: >3 FB Neck ROM: Full    Dental no notable dental hx. (+) Edentulous Upper, Missing,    Pulmonary Current Smoker and Patient abstained from smoking.   Pulmonary exam normal breath sounds clear to auscultation       Cardiovascular hypertension, (-) angina (-) Past MI Normal cardiovascular exam Rhythm:Regular Rate:Normal     Neuro/Psych    GI/Hepatic ,GERD  Medicated and Controlled,,(+)     substance abuse (hx)  alcohol use  Endo/Other    Renal/GU Lab Results      Component                Value               Date                      NA                       125 (L)             12/17/2023                CL                       92 (L)              12/17/2023                K                        3.9                 12/17/2023                CO2                      20 (L)              12/17/2023                BUN                      <5 (L)              12/17/2023                CREATININE               0.55 (L)            12/17/2023                            Malignant neoplasm of Prostate    Musculoskeletal  (+) Arthritis ,    Abdominal   Peds  Hematology Lab Results      Component                Value               Date  HGB                      12.3 (L)            12/17/2023                HCT                      35.4 (L)            12/17/2023         PLT                      246                 12/17/2023              Anesthesia Other Findings All: lisinopril  Reproductive/Obstetrics                             Anesthesia Physical Anesthesia  Plan  ASA: 3  Anesthesia Plan: General   Post-op Pain Management: Tylenol  PO (pre-op)*   Induction: Intravenous  PONV Risk Score and Plan: 2 and Treatment may vary due to age or medical condition, Ondansetron  and Midazolam   Airway Management Planned: Oral ETT  Additional Equipment: None  Intra-op Plan:   Post-operative Plan: Extubation in OR  Informed Consent: I have reviewed the patients History and Physical, chart, labs and discussed the procedure including the risks, benefits and alternatives for the proposed anesthesia with the patient or authorized representative who has indicated his/her understanding and acceptance.     Dental advisory given  Plan Discussed with: CRNA and Surgeon  Anesthesia Plan Comments:        Anesthesia Quick Evaluation

## 2023-12-26 ENCOUNTER — Ambulatory Visit (HOSPITAL_BASED_OUTPATIENT_CLINIC_OR_DEPARTMENT_OTHER): Payer: Self-pay | Admitting: Anesthesiology

## 2023-12-26 ENCOUNTER — Encounter (HOSPITAL_COMMUNITY): Payer: Self-pay | Admitting: Urology

## 2023-12-26 ENCOUNTER — Encounter (HOSPITAL_COMMUNITY)
Admission: RE | Disposition: A | Discharge status: Home / Self Care | Payer: Self-pay | Source: Home / Self Care | Attending: Urology

## 2023-12-26 ENCOUNTER — Other Ambulatory Visit: Payer: Self-pay | Admitting: Urology

## 2023-12-26 ENCOUNTER — Ambulatory Visit (HOSPITAL_COMMUNITY): Payer: Self-pay | Admitting: Anesthesiology

## 2023-12-26 ENCOUNTER — Other Ambulatory Visit: Payer: Self-pay

## 2023-12-26 ENCOUNTER — Ambulatory Visit (HOSPITAL_COMMUNITY)

## 2023-12-26 ENCOUNTER — Ambulatory Visit (HOSPITAL_COMMUNITY)
Admission: RE | Admit: 2023-12-26 | Discharge: 2023-12-26 | Disposition: A | Discharge status: Home / Self Care | Attending: Urology | Admitting: Urology

## 2023-12-26 DIAGNOSIS — I10 Essential (primary) hypertension: Secondary | ICD-10-CM | POA: Insufficient documentation

## 2023-12-26 DIAGNOSIS — F17210 Nicotine dependence, cigarettes, uncomplicated: Secondary | ICD-10-CM | POA: Insufficient documentation

## 2023-12-26 DIAGNOSIS — C61 Malignant neoplasm of prostate: Secondary | ICD-10-CM

## 2023-12-26 DIAGNOSIS — K219 Gastro-esophageal reflux disease without esophagitis: Secondary | ICD-10-CM | POA: Diagnosis not present

## 2023-12-26 HISTORY — PX: SPACE OAR INSTILLATION: SHX6769

## 2023-12-26 HISTORY — PX: RADIOACTIVE SEED IMPLANT: SHX5150

## 2023-12-26 SURGERY — INSERTION, RADIATION SOURCE, PROSTATE
Anesthesia: General

## 2023-12-26 MED ORDER — LACTATED RINGERS IV SOLN: INTRAVENOUS | Status: DC

## 2023-12-26 MED ORDER — ONDANSETRON HCL 4 MG/2ML IJ SOLN
INTRAMUSCULAR | Status: DC | PRN
  Administered 2023-12-26: 4 mg via INTRAVENOUS

## 2023-12-26 MED ORDER — PROPOFOL 10 MG/ML IV BOLUS
INTRAVENOUS | Status: AC
  Filled 2023-12-26: qty 20

## 2023-12-26 MED ORDER — EPHEDRINE SULFATE-NACL 50-0.9 MG/10ML-% IV SOSY
PREFILLED_SYRINGE | INTRAVENOUS | Status: DC | PRN
  Administered 2023-12-26 (×2): 5 mg via INTRAVENOUS

## 2023-12-26 MED ORDER — IOHEXOL 300 MG/ML  SOLN
INTRAMUSCULAR | Status: DC | PRN
Start: 2023-12-26 — End: 2023-12-26
  Administered 2023-12-26: 5 mL

## 2023-12-26 MED ORDER — SODIUM CHLORIDE 0.9 % IV SOLN: INTRAVENOUS | Status: DC

## 2023-12-26 MED ORDER — FENTANYL CITRATE (PF) 100 MCG/2ML IJ SOLN
INTRAMUSCULAR | Status: AC
  Filled 2023-12-26: qty 2

## 2023-12-26 MED ORDER — ACETAMINOPHEN 10 MG/ML IV SOLN: 1000.0000 mg | Freq: Once | INTRAVENOUS | Status: DC | PRN

## 2023-12-26 MED ORDER — TAMSULOSIN HCL 0.4 MG PO CAPS: 0.4000 mg | ORAL_CAPSULE | Freq: Every day | ORAL | 2 refills | Status: DC

## 2023-12-26 MED ORDER — EPHEDRINE 5 MG/ML INJ
INTRAVENOUS | Status: AC
  Filled 2023-12-26: qty 5

## 2023-12-26 MED ORDER — DEXAMETHASONE SODIUM PHOSPHATE 10 MG/ML IJ SOLN
INTRAMUSCULAR | Status: AC
Start: 2023-12-26 — End: ?
  Filled 2023-12-26: qty 1

## 2023-12-26 MED ORDER — FENTANYL CITRATE PF 50 MCG/ML IJ SOSY: 25.0000 ug | PREFILLED_SYRINGE | INTRAMUSCULAR | Status: DC | PRN

## 2023-12-26 MED ORDER — STERILE WATER FOR IRRIGATION IR SOLN
Status: DC | PRN
  Administered 2023-12-26: 3000 mL

## 2023-12-26 MED ORDER — DEXAMETHASONE SODIUM PHOSPHATE 10 MG/ML IJ SOLN
INTRAMUSCULAR | Status: DC | PRN
  Administered 2023-12-26: 5 mg via INTRAVENOUS

## 2023-12-26 MED ORDER — LIDOCAINE HCL (PF) 2 % IJ SOLN
INTRAMUSCULAR | Status: AC
  Filled 2023-12-26: qty 5

## 2023-12-26 MED ORDER — CHLORHEXIDINE GLUCONATE 0.12 % MT SOLN
15.0000 mL | Freq: Once | OROMUCOSAL | Status: AC
  Administered 2023-12-26: 15 mL via OROMUCOSAL

## 2023-12-26 MED ORDER — ORAL CARE MOUTH RINSE: 15.0000 mL | Freq: Once | OROMUCOSAL | Status: AC

## 2023-12-26 MED ORDER — ROCURONIUM BROMIDE 10 MG/ML (PF) SYRINGE
PREFILLED_SYRINGE | INTRAVENOUS | Status: AC
  Filled 2023-12-26: qty 10

## 2023-12-26 MED ORDER — ONDANSETRON HCL 4 MG/2ML IJ SOLN
INTRAMUSCULAR | Status: AC
  Filled 2023-12-26: qty 2

## 2023-12-26 MED ORDER — TRAMADOL HCL 50 MG PO TABS: 50.0000 mg | ORAL_TABLET | Freq: Four times a day (QID) | ORAL | 0 refills | Status: DC | PRN

## 2023-12-26 MED ORDER — SODIUM CHLORIDE (PF) 0.9 % IJ SOLN
INTRAMUSCULAR | Status: AC
  Filled 2023-12-26: qty 10

## 2023-12-26 MED ORDER — ROCURONIUM BROMIDE 10 MG/ML (PF) SYRINGE
PREFILLED_SYRINGE | INTRAVENOUS | Status: DC | PRN
  Administered 2023-12-26: 40 mg via INTRAVENOUS
  Administered 2023-12-26: 10 mg via INTRAVENOUS

## 2023-12-26 MED ORDER — CIPROFLOXACIN IN D5W 400 MG/200ML IV SOLN
400.0000 mg | INTRAVENOUS | Status: AC
Start: 2023-12-26 — End: 2023-12-26
  Administered 2023-12-26: 400 mg via INTRAVENOUS
  Filled 2023-12-26: qty 200

## 2023-12-26 MED ORDER — LIDOCAINE HCL (PF) 2 % IJ SOLN
INTRAMUSCULAR | Status: DC | PRN
  Administered 2023-12-26: 80 mg via INTRADERMAL

## 2023-12-26 MED ORDER — PROPOFOL 10 MG/ML IV BOLUS
INTRAVENOUS | Status: DC | PRN
  Administered 2023-12-26: 100 mg via INTRAVENOUS

## 2023-12-26 MED ORDER — SUGAMMADEX SODIUM 200 MG/2ML IV SOLN
INTRAVENOUS | Status: DC | PRN
  Administered 2023-12-26: 200 mg via INTRAVENOUS

## 2023-12-26 MED ORDER — ONDANSETRON HCL 4 MG/2ML IJ SOLN: 4.0000 mg | Freq: Once | INTRAMUSCULAR | Status: DC | PRN

## 2023-12-26 MED ORDER — FENTANYL CITRATE (PF) 100 MCG/2ML IJ SOLN
INTRAMUSCULAR | Status: DC | PRN
  Administered 2023-12-26: 50 ug via INTRAVENOUS
  Administered 2023-12-26 (×2): 25 ug via INTRAVENOUS

## 2023-12-26 MED ORDER — FLEET ENEMA RE ENEM: 1.0000 | ENEMA | Freq: Once | RECTAL | Status: DC

## 2023-12-26 SURGICAL SUPPLY — 28 items
BAG URINE DRAIN 2000ML AR STRL (UROLOGICAL SUPPLIES) ×1 IMPLANT
Bard Quicklink Cartridges with BrachySource I-25 s IMPLANT
CATH FOLEY 2WAY SLVR 5CC 16FR (CATHETERS) ×1 IMPLANT
CATH ROBINSON RED A/P 20FR (CATHETERS) ×1 IMPLANT
COVER BACK TABLE 60X90IN (DRAPES) ×1 IMPLANT
COVER MAYO STAND STRL (DRAPES) ×1 IMPLANT
DRSG TEGADERM 4X4.75 (GAUZE/BANDAGES/DRESSINGS) ×2 IMPLANT
DRSG TEGADERM 8X12 (GAUZE/BANDAGES/DRESSINGS) ×2 IMPLANT
GLOVE BIO SURGEON STRL SZ7.5 (GLOVE) ×1 IMPLANT
GLOVE SURG LX STRL 7.5 STRW (GLOVE) ×2 IMPLANT
GOWN STRL REUS W/ TWL LRG LVL3 (GOWN DISPOSABLE) ×2 IMPLANT
GRID BRACH TEMP 18GA 2.8X3X.75 (MISCELLANEOUS) ×1 IMPLANT
HOLDER FOLEY CATH W/STRAP (MISCELLANEOUS) ×1 IMPLANT
IMPL SPACEOAR SYSTEM 10ML (Spacer) ×1 IMPLANT
KIT TURNOVER KIT A (KITS) IMPLANT
MARKER SKIN DUAL TIP RULER LAB (MISCELLANEOUS) ×1 IMPLANT
NDL BRACHY 18G 5PK (NEEDLE) ×4 IMPLANT
NDL BRACHY 18G SINGLE (NEEDLE) IMPLANT
NDL PK MORGANSTERN STABILIZ (NEEDLE) ×1 IMPLANT
NEEDLE BRACHY 18G 5PK (NEEDLE) ×4 IMPLANT
NEEDLE BRACHY 18G SINGLE (NEEDLE) IMPLANT
NEEDLE PK MORGANSTERN STABILIZ (NEEDLE) ×1 IMPLANT
PACK CYSTO (CUSTOM PROCEDURE TRAY) ×1 IMPLANT
SURGILUBE 2OZ TUBE FLIPTOP (MISCELLANEOUS) ×1 IMPLANT
SYR 10ML LL (SYRINGE) ×1 IMPLANT
TOWEL OR 17X26 10 PK STRL BLUE (TOWEL DISPOSABLE) ×1 IMPLANT
TRAY FOLEY MTR SLVR 16FR STAT (SET/KITS/TRAYS/PACK) ×1 IMPLANT
UNDERPAD 30X36 HEAVY ABSORB (UNDERPADS AND DIAPERS) ×3 IMPLANT

## 2023-12-26 NOTE — H&P (Signed)
 Prostate cancer   Patient was prostate cancer in 2/25. 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 He had grade group 1 (Gleason 3+3 = 6) cancer in 3 cores.   His prostate volume was 30.75 g.   At the time of diagnosis the patient's PSA was 9.83.   IPSS: 3, QOL 2   The patient is otherwise reasonably healthy. Does have significant tobacco dependence. Otherwise he is treated for high blood pressure and acid reflux.     ALLERGIES: Lisinopril - Skin Rash    MEDICATIONS: Latanoprost 0.005 % drops 1 PO Daily  Pantoprazole  Sodium 40 mg tablet, delayed release 1 tablet PO Daily     GU PSH: Prostate Needle Biopsy - 10/14/2023     NON-GU PSH: Surgical Pathology, Gross And Microscopic Examination For Prostate Needle - 10/14/2023 Visit Complexity (formerly GPC1X) - 06/20/2023     GU PMH: Elevated PSA - 10/14/2023, - 06/20/2023, - 11/06/2021, - 05/04/2021, - 2022    NON-GU PMH: Pyuria/other UA findings - 06/20/2023 Hypertension    FAMILY HISTORY: 2 daughters - Daughter Death In The Family Father - Father Death In The Family Mother - Mother   SOCIAL HISTORY: Marital Status: Single Preferred Language: English; Ethnicity: Not Hispanic Or Latino; Race: Black or African American Current Smoking Status: Patient smokes. Has smoked since 02/24/1996. Smokes 1 pack per day.   Tobacco Use Assessment Completed: Used Tobacco in last 30 days? Does not use smokeless tobacco. Drinks 6 drinks per day.     REVIEW OF SYSTEMS:    GU Review Male:   Patient denies frequent urination, hard to postpone urination, burning/ pain with urination, get up at night to urinate, leakage of urine, stream starts and stops, trouble starting your stream, have to strain to urinate , erection problems, and penile pain.  Gastrointestinal (Upper):   Patient denies nausea, vomiting, and indigestion/ heartburn.  Gastrointestinal (Lower):   Patient denies diarrhea and constipation.   Constitutional:   Patient denies fever, night sweats, weight loss, and fatigue.  Skin:   Patient denies skin rash/ lesion and itching.  Eyes:   Patient denies blurred vision and double vision.  Ears/ Nose/ Throat:   Patient denies sore throat and sinus problems.  Hematologic/Lymphatic:   Patient denies swollen glands and easy bruising.  Cardiovascular:   Patient denies leg swelling and chest pains.  Respiratory:   Patient denies cough and shortness of breath.  Endocrine:   Patient denies excessive thirst.  Musculoskeletal:   Patient denies back pain and joint pain.  Neurological:   Patient denies headaches and dizziness.  Psychologic:   Patient denies depression and anxiety.   VITAL SIGNS: None   GU PHYSICAL EXAMINATION:    Anus and Perineum: No hemorrhoids. No anal stenosis. No rectal fissure, no anal fissure. No edema, no dimple, no perineal tenderness, no anal tenderness.  Seminal Vesicles: Nonpalpable.  Sphincter Tone: Normal sphincter. No rectal tenderness. No rectal mass.    MULTI-SYSTEM PHYSICAL EXAMINATION:    Constitutional: Well-nourished. No physical deformities. Normally developed. Good grooming.  Neck: Neck symmetrical, not swollen. Normal tracheal position.  Respiratory: No labored breathing, no use of accessory muscles.   Cardiovascular: Normal temperature, normal extremity pulses, no swelling, no varicosities.  Skin: No paleness, no jaundice, no cyanosis. No lesion, no ulcer, no rash.  Neurologic / Psychiatric: Oriented to time, oriented to place, oriented to person. No depression, no anxiety, no agitation.  Gastrointestinal: No mass, no tenderness, no rigidity,  non obese abdomen.  Musculoskeletal: Normal gait and station of head and neck.     Complexity of Data:  Source Of History:  Patient  Lab Test Review:   PSA  Records Review:   Pathology Reports, Previous Doctor Records, Previous Patient Records, POC Tool  Urine Test Review:   Urinalysis  Urodynamics Review:    Review Bladder Scan   06/20/23 01/01/23 12/19/21 10/29/21 04/17/21 03/06/21  PSA  Total PSA 9.83 ng/mL 8.338 ng/ml 7.669 ng/ml 7.42 ng/mL 5.55 ng/mL 9.74 ng/mL  Free PSA 0.94 ng/mL   0.61 ng/mL  1.57 ng/mL  % Free PSA 10 % PSA   8 % PSA  16 % PSA    PROCEDURES:          Visit Complexity - G2211          Urinalysis w/Scope Dipstick Dipstick Cont'd Micro  Color: Yellow Bilirubin: Neg mg/dL WBC/hpf: 0 - 5/hpf  Appearance: Clear Ketones: Neg mg/dL RBC/hpf: 3 - 30/QMV  Specific Gravity: <=1.005 Blood: 3+ ery/uL Bacteria: Rare (0-9/hpf)  pH: 6.5 Protein: Trace mg/dL Cystals: NS (Not Seen)  Glucose: Neg mg/dL Urobilinogen: 0.2 mg/dL Casts: NS (Not Seen)    Nitrites: Neg Trichomonas: Not Present    Leukocyte Esterase: Neg leu/uL Mucous: Not Present      Epithelial Cells: 0 - 5/hpf      Yeast: NS (Not Seen)      Sperm: Not Present    ASSESSMENT:      ICD-10 Details  1 GU:   Prostate Cancer - C61    PLAN:           Schedule Labs: 6 Months - PSA  Return Visit/Planned Activity: 6 Months - Office Visit          Document Letter(s):  Created for Patient: Clinical Summary         Notes:   I reviewed the patient's biopsy report with him. I explained the severity of it and the treatment options. He has grade group 2 prostate cancer, but reasonably high volume. Fortunately he has minimal voiding symptoms and a fairly small prostate. He may be well served with brachytherapy which I explained to him. I am referring him to the [facility] to discuss this in more detail with Dr. Lorri Rota.

## 2023-12-26 NOTE — Anesthesia Procedure Notes (Signed)
 Procedure Name: Intubation Date/Time: 12/26/2023 11:39 AM  Performed by: Jenene Miser, CRNAPre-anesthesia Checklist: Patient identified, Emergency Drugs available, Suction available and Patient being monitored Patient Re-evaluated:Patient Re-evaluated prior to induction Oxygen Delivery Method: Circle system utilized Preoxygenation: Pre-oxygenation with 100% oxygen Induction Type: IV induction Ventilation: Mask ventilation without difficulty Laryngoscope Size: Mac and 4 Grade View: Grade I Tube type: Oral Tube size: 7.5 mm Number of attempts: 1 Airway Equipment and Method: Stylet Placement Confirmation: ETT inserted through vocal cords under direct vision, positive ETCO2 and breath sounds checked- equal and bilateral Secured at: 21 cm Tube secured with: Tape Dental Injury: Teeth and Oropharynx as per pre-operative assessment

## 2023-12-26 NOTE — Anesthesia Postprocedure Evaluation (Signed)
 Anesthesia Post Note  Patient: Logan Chambers  Procedure(s) Performed: INSERTION, RADIATION SOURCE, PROSTATE INJECTION, HYDROGEL SPACER     Patient location during evaluation: PACU Anesthesia Type: General Level of consciousness: awake and alert Pain management: pain level controlled Vital Signs Assessment: post-procedure vital signs reviewed and stable Respiratory status: spontaneous breathing, nonlabored ventilation, respiratory function stable and patient connected to nasal cannula oxygen Cardiovascular status: blood pressure returned to baseline and stable Postop Assessment: no apparent nausea or vomiting Anesthetic complications: no  No notable events documented.  Last Vitals:  Vitals:   12/26/23 0901 12/26/23 1300  BP: (!) 165/85 (!) 161/73  Pulse: 65 63  Resp: 16 14  Temp: (!) 36.3 C (!) 36.2 C  SpO2: 100% 100%    Last Pain:  Vitals:   12/26/23 0913  TempSrc:   PainSc: 0-No pain                 Rosalita Combe

## 2023-12-26 NOTE — Interval H&P Note (Signed)
 History and Physical Interval Note:  12/26/2023 10:58 AM  Logan Chambers  has presented today for surgery, with the diagnosis of BRACHYTHERAPY AND SPACE OAR.  The various methods of treatment have been discussed with the patient and family. After consideration of risks, benefits and other options for treatment, the patient has consented to  Procedure(s) with comments: INSERTION, RADIATION SOURCE, PROSTATE (N/A) - RADIOACTIVE SEED IMPLANT AND SPACE OAR INJECTION, HYDROGEL SPACER (N/A) as a surgical intervention.  The patient's history has been reviewed, patient examined, no change in status, stable for surgery.  I have reviewed the patient's chart and labs.  Questions were answered to the patient's satisfaction.     Andrez Banker

## 2023-12-26 NOTE — Op Note (Signed)
 Preoperative diagnosis:  Clinical stage TI C adenocarcinoma the prostate  Postoperative diagnosis:  Same  Procedure:  #1 I-125 prostate seed implantation  #2 cystourethroscopy #3 instillation of SpaceOAR biogel  Surgeon: Salli Crawley, M.D. Radiation Oncologist: Kenith Payer, M.D.  Anesthesia: Gen.   Indications: Patient  was diagnosed with clinical stage TI C prostate cancer. We had extensive discussion with him about treatment options versus. He elected to proceed with seed implantation. He underwent consultation my office as well as with Dr. Kenith Payer. He appeared to understand the advantages disadvantages potential risks of this treatment option. Full informed consent has been obtained. The patient is had preoperative ciprofloxacin . PAS compression boots were placed.   Technique and findings: Patient was brought the operating room where he had  successful induction of general anesthesia. He was placed in lithotomy position and prepped and draped in usual manner. Appropriate surgical timeout was performed. Radiation oncology department placed a transrectal ultrasound probe anchoring stand. Foley catheter with contrast in the balloon was inserted without difficulty. Anchoring needles were placed within the prostate.  Real-time contouring of the urethra prostate and rectum were performed and the dosing parameters were established. Targeted dose was 145 gray. We then came to the operating suite suite for placement of the needles. A second timeout was performed. All needle passage was done with real-time transrectal ultrasound guidance with the sagittal plane. A total of 18 needles were placed.  61 active seeds were implanted.  The brachytherapy template was then removed.    A site in the midline was selected on the perineum for placement of an 18 g needle with saline.  The needle was advanced above the rectum and below Denonvillier's fascia to the mid gland and confirmed to be in the  midline on transverse imaging.  One cc of saline was injected confirming appropriate expansion of this space.  A total of 5 cc of saline was then injected to open the space further bilaterally.  The saline syringe was then removed and the SpaceOAR hydrogel was injected with good distribution bilaterally.A Foley catheter was removed and flexible cystoscopy failed to show any seeds outside the prostate. The Foley catheter was inserted which drained clear urine. The patient was brought to recovery room in stable condition.

## 2023-12-26 NOTE — Transfer of Care (Signed)
 Immediate Anesthesia Transfer of Care Note  Patient: Logan Chambers  Procedure(s) Performed: INSERTION, RADIATION SOURCE, PROSTATE INJECTION, HYDROGEL SPACER  Patient Location: PACU  Anesthesia Type:General  Level of Consciousness: awake, alert , and oriented  Airway & Oxygen Therapy: Patient Spontanous Breathing and Patient connected to face mask oxygen  Post-op Assessment: Report given to RN and Post -op Vital signs reviewed and stable  Post vital signs: Reviewed and stable  Last Vitals:  Vitals Value Taken Time  BP 150/123 12/26/23 1255  Temp    Pulse 70 12/26/23 1256  Resp 13 12/26/23 1256  SpO2 100 % 12/26/23 1256  Vitals shown include unfiled device data.  Last Pain:  Vitals:   12/26/23 0913  TempSrc:   PainSc: 0-No pain      Patients Stated Pain Goal: 5 (12/26/23 0913)  Complications: No notable events documented.

## 2023-12-26 NOTE — Addendum Note (Signed)
 Addendum  created 12/26/23 1318 by Jenene Miser, CRNA   Intraprocedure Meds edited

## 2023-12-26 NOTE — Discharge Instructions (Signed)
 DISCHARGE INSTRUCTIONS FOR PROSTATE SEED IMPLANTATION  Activity:    Rest for the remainder of the day.  Do not drive or operate equipment today.  You may resume normal  activities in a few days as instructed by your physician, without risk of harmful radiation exposure to those around you, provided you follow the time and distance precautions on the Radiation Oncology Instruction Sheet.   Meals: Drink plenty of lipuids and eat light foods, such as gelatin or soup this evening .  You may return to normal meal plan tomorrow.  Return To Work: You may return to work as instructed by Designer, multimedia.  Special Instruction:   If any seeds are found, use tweezers to pick up seeds and place in a glass container of any kind and bring to your physician's office.  Call your physician if any of these symptoms occur:  Persistent or heavy bleeding Urine stream diminishes or stops completely after catheter is removed Fever equal to or greater than 101 degrees F Cloudy urine with a strong foul odor Severe pain  You may feel some burning pain and/or hesitancy when you urinate after the catheter is removed.  These symptoms may increase over the next few weeks, but should diminish within forur to six weeks.  Applying moist heat to the lower abdomen or a hot tub bath may help relieve the pain.  If the discomfort becomes severe, please call your physician for additional medications.    PROSTATE CANCER TREATMENT WITH RADIOACTIVE IODINE-125 SEED IMPLANT  This instruction sheet is intended to discuss implantation of Iodine-125 seeds as treatment for cancer of the prostate. It will explain in detail what you may expect from this treatment and what precautions are necessary as a result of the treatment. Iodine-125 emits a relatively low energy radiation. The radioactive seeds are surgically implanted directly into the prostate gland. Most of the radiation is contained within the prostate gland. A very small amount  is present outside the body.The precautions that we ask you to take are to ensure that those around you are protected from unnecessary radiation. The principles of radiation safety that you need to understand are:  DISTANCE: The further a person is from the radioactive implant the less radiation they will be receiving. The amount of radiation received falls off quite rapidly with distance. More specific guidelines are given in the table on the last page.  TIME: The amount of radiation a person is exposed to is directly proportional to the amount of time that is spent in close proximity to the radioactive implant. Very little radiation will be received during short periods. See the table on the last page for more specific guideline.  CHILDREN UNDER AGE 35 Children should not be allowed to sit on your lap or otherwise be in very close contact for more than a few minutes for the first 6-8 weeks following the implant. You may affectionately greet (hug/kiss) a child for a short period of time, but remember, the longer you are in close proximity with that child the more radiation they are being exposed to. At a distance of 6 feet there is no limit to the length of time you may spend together. See specific guidelines on the last page.  PREGNANT OR POSSIBLY PREGNANT WOMEN Pregnant women should avoid prolonged close physical contact with you for the first 6-8 weeks after implant. At a distance of 6 feet there is no limit to the length of time you may spend together. Pregnant women or possibly  pregnant women can safely be in close contact with you for a limited period of time. See the last page for guidelines.  FAMILY RELATIONS You may sleep in the same bed as your partner (provided she is not pregnant or under the age of 75). Sexual intercourse, using a condom, may be resumed 2 weeks after the implant. Your semen may be discolored, dark brown or black. This is normal and is the result of bleeding that may have  occurred during the implant. After 3-4 weeks it will not be necessary to use a condom.  DAILY ACTIVITIES You may resume normal activities in a few days (example: work, shopping, church) without the risk of harmful radiation exposure to those around you provided you keep in mind the time and distance precautions. Objects that you touch or item that you use do not become radioactive. Linens, clothing, tableware, and dishes may be used by other persons without special precautions. Your bodily wastes (urine and stool) are not radioactive.  SPECIAL PRECAUTIONS It is possible to lose implanted Iodine-125 seed(s) through urination. Although it is possible to pass seeds indefinitely, it is most likely to occur immediately after catheter removal. To prevent this from happening the catheter that was in place during the implant procedure is removed immediately after the implant and a cystoscopy procedure is performed. The process of removing the catheter and the cystoscopy procedure should dislodge and remove any seeds that are not firmly imbedded in the prostate tissue. However, you should watch for seeds if/when you remove your catheter at home. The seeds are silver colored and the size of a grain of rice. In the unlikely event that a seed is seen after urination, simply flush the seed down the toilet. The seed should not be handled with your fingers, not even with a glove or napkin. A spoon or tweezers can be used to pick up a seed. The Radiation Oncology department is open Monday - Friday from 8:00 am to 5:30 pm with a Radiation Oncologist on call at all times. He or she may be reached by calling [phone]. If you are to be hospitalized or if death should occur, your family should notify the Actor.  SIDE EFFECTS There are very few side effects associate with the implant procedure. Minor burning with urination, weak stream, hesitancy, intermittency, frequency, mild pain or feeling unable to  pass your urine freely are common and usually stop in one to four months. If these symptoms are extremely uncomfortable, contact your physician.  RADIATION SAFETY GUIDELINES PROSTATE CANCER TREATMENT WITH RADIOACTIVE IODINE-125 SEED IMPLANT  The following guidelines will limit exposure to less than naturally occurring background radiation.  PERSONS AGE 77-45 (if able to become pregnant)  FOR 8 WEEKS FOLLOWING IMPLANT  At a distance of 1 foot: limit time to less than 2 hours/week At a distance of 3 feet: limit time to 20 hours/week At a distance of 6 feet: no restrictions  AFTER 8 WEEKS No restrictions  CHILDREN UNDER AGE 77, PREGNANT WOMEN OR POSSIBLY PREGNANT WOMEN  FOR 8 WEEKS FOLLOWING IMPLANT At a distance of 1 foot: limit time to 10 minutes/week At a distance of 3 feet: limit time to 2 hours/week At a distance of 6 feet: no restrictions  AFTER 8 WEEKS No restrictions  PERSONS OVER THE AGE OF 45 AND DO NOT EXPECT TO HAVE ANY MORE CHILDREN No restrictions  Updated by SCP in January 2020

## 2023-12-27 NOTE — Progress Notes (Signed)
  Radiation Oncology         [phone] ________________________________  Name: Logan Chambers MRN: [id number]  Date: 12/27/2023  DOB: 1951-06-26       Prostate Seed Implant  CC:Barnetta Liberty, MD  No ref. provider found  DIAGNOSIS:  73 y.o. gentleman with Stage T1c adenocarcinoma of the prostate with Gleason score of 3+4, and PSA of 9.83.   Oncology History  Malignant neoplasm of prostate (HCC)  10/14/2023 Cancer Staging   Staging form: Prostate, AJCC 8th Edition - Clinical stage from 10/14/2023: Stage IIB (cT1c, cN0, cM0, PSA: 9.8, Grade Group: 2) - Signed by Keitha Pata, PA-C on 11/11/2023 Histopathologic type: Adenocarcinoma, NOS Stage prefix: Initial diagnosis Prostate specific antigen (PSA) range: Less than 10 Gleason primary pattern: 3 Gleason secondary pattern: 4 Gleason score: 7 Histologic grading system: 5 grade system Number of biopsy cores examined: 12 Number of biopsy cores positive: 8 Location of positive needle core biopsies: Both sides   11/11/2023 Initial Diagnosis   Malignant neoplasm of prostate (HCC)       ICD-10-CM   1. Hypertension, unspecified type  I10 CANCELED: Basic metabolic panel per protocol    CANCELED: CBC per protocol    2. Malignant neoplasm of prostate [facility])  C61 Discharge patient      PROCEDURE: Insertion of radioactive I-125 seeds into the prostate gland.  RADIATION DOSE: 145 Gy, definitive therapy.  TECHNIQUE: Branten Felber was brought to the operating room with the urologist. He was placed in the dorsolithotomy position. He was catheterized and a rectal tube was inserted. The perineum was shaved, prepped and draped. The ultrasound probe was then introduced by me into the rectum to see the prostate gland.  TREATMENT DEVICE: I attached the needle grid to the ultrasound probe stand and anchor needles were placed.  3D PLANNING: The prostate was imaged in 3D using a sagittal sweep of the prostate probe. These images were transferred  to the planning computer. There, the prostate, urethra and rectum were defined on each axial reconstructed image. Then, the software created an optimized 3D plan and a few seed positions were adjusted. The quality of the plan was reviewed using [facility] information for the target and the following two organs at risk:  Urethra and Rectum.  Then the accepted plan was printed and handed off to the radiation therapist.  Under my supervision, the custom loading of the seeds and spacers was carried out using the quick loader.  These pre-loaded needles were then placed into the needle holder.Aaron Aas  PROSTATE VOLUME STUDY:  Using transrectal ultrasound the volume of the prostate was verified to be 31.1 cc.  SPECIAL TREATMENT PROCEDURE/SUPERVISION AND HANDLING: The pre-loaded needles were then delivered by the urologist under sagittal guidance. A total of 18 needles were used to deposit 61 seeds in the prostate gland. The individual seed activity was 0.396 mCi.  SpaceOAR:  Yes  COMPLEX SIMULATION: At the end of the procedure, an anterior radiograph of the pelvis was obtained to document seed positioning and count. Cystoscopy was performed by the urologist to check the urethra and bladder.  MICRODOSIMETRY: At the end of the procedure, the patient was emitting 0.185 mR/hr at 1 meter. Accordingly, he was considered safe for hospital discharge.  PLAN: The patient will return to the radiation oncology clinic for post implant CT dosimetry in three weeks.   ________________________________  Trilby Fujisawa Lorri Rota, M.D.

## 2023-12-29 ENCOUNTER — Encounter (HOSPITAL_COMMUNITY): Payer: Self-pay | Admitting: Urology

## 2023-12-30 ENCOUNTER — Encounter (HOSPITAL_COMMUNITY): Payer: Self-pay | Admitting: Urology

## 2024-01-01 NOTE — Progress Notes (Signed)
 Patient was a RadOnc Consult on 11/12/23 for his Stage T1c adenocarcinoma of the prostate with Gleason score of 3+4, and PSA of 9.83.  Patient proceed with treatment recommendations of brachytherapy and had his treatment on 5/2.   Patient is scheduled for a CT Sim post seed on 5/22 and urology post op follow up on 5/23.  RN spoke with patient and reviewed next steps with post treatment PSA monitoring. All questions answered, no additional needs.

## 2024-01-13 ENCOUNTER — Telehealth: Payer: Self-pay | Admitting: *Deleted

## 2024-01-13 NOTE — Telephone Encounter (Signed)
 CALLED PATIENT TO REMIND OF POST SEED APPTS. AND MRI FOR 01/15/24, SPOKE WITH PATIENT AND HE IS AWARE OF THESE APPTS. AND THE INSTRUCTIONS

## 2024-01-13 NOTE — Progress Notes (Signed)
 Post-seed nursing interview for a diagnosis of Stage T1c adenocarcinoma of the prostate with Gleason score of 3+4, and PSA of 9.83.  Patient identity verified x2.   Patient states issues as follows...  -Pain: Denies -Fatigue: Mild -Abdomen: Denies -Groin: Denies -Urinary: Frequency -Bowels: Denies -Appetite: Good  Patient denies all other related issues at this time.  Meaningful use complete.  I-PSS (AUA) score- 12 - Moderate SHIM (ED) score- 23 Urinary Management medication(s) Tamsulosin  Urology appointment date- 01/16/2024 with Dr. Dulcy Gibney at [facility]  Vitals- BP (!) 154/70   Pulse (!) 58   Temp (!) 96.9 F (36.1 C) (Temporal)   Resp 18   Ht 6\' 2"  (1.88 m)   Wt 136 lb (61.7 kg)   SpO2 100%   BMI 17.46 kg/m   This concludes the interaction.   Avery Bodo, LPN

## 2024-01-14 ENCOUNTER — Other Ambulatory Visit: Payer: Self-pay | Admitting: Internal Medicine

## 2024-01-14 ENCOUNTER — Encounter: Payer: Self-pay | Admitting: Internal Medicine

## 2024-01-14 DIAGNOSIS — I739 Peripheral vascular disease, unspecified: Secondary | ICD-10-CM

## 2024-01-14 DIAGNOSIS — R9389 Abnormal findings on diagnostic imaging of other specified body structures: Secondary | ICD-10-CM

## 2024-01-15 ENCOUNTER — Ambulatory Visit (HOSPITAL_COMMUNITY)
Admission: RE | Admit: 2024-01-15 | Discharge: 2024-01-15 | Disposition: A | Discharge status: Home / Self Care | Source: Ambulatory Visit | Attending: Urology | Admitting: Urology

## 2024-01-15 ENCOUNTER — Ambulatory Visit
Admission: RE | Admit: 2024-01-15 | Discharge: 2024-01-15 | Disposition: A | Discharge status: Home / Self Care | Payer: Self-pay | Source: Ambulatory Visit | Attending: Urology | Admitting: Urology

## 2024-01-15 ENCOUNTER — Encounter: Payer: Self-pay | Admitting: Urology

## 2024-01-15 ENCOUNTER — Ambulatory Visit
Admission: RE | Admit: 2024-01-15 | Discharge: 2024-01-15 | Disposition: A | Discharge status: Home / Self Care | Source: Ambulatory Visit | Attending: Radiation Oncology | Admitting: Radiation Oncology

## 2024-01-15 VITALS — BP 154/70 | HR 58 | Temp 96.9°F | Resp 18 | Ht 74.0 in | Wt 136.6 lb

## 2024-01-15 VITALS — BP 154/70 | HR 58 | Temp 96.9°F | Resp 18 | Ht 74.0 in | Wt 136.0 lb

## 2024-01-15 DIAGNOSIS — Z79899 Other long term (current) drug therapy: Secondary | ICD-10-CM | POA: Insufficient documentation

## 2024-01-15 DIAGNOSIS — C61 Malignant neoplasm of prostate: Secondary | ICD-10-CM | POA: Insufficient documentation

## 2024-01-15 DIAGNOSIS — Z923 Personal history of irradiation: Secondary | ICD-10-CM | POA: Insufficient documentation

## 2024-01-15 NOTE — Progress Notes (Signed)
  Radiation Oncology         [phone]) [phone] ________________________________  Name: Logan Chambers MRN: [id number]  Date: 01/15/2024  DOB: 06-09-1951  COMPLEX SIMULATION NOTE  NARRATIVE:  The patient was brought to the CT Simulation planning suite today following prostate seed implantation approximately one month ago.  Identity was confirmed.  All relevant records and images related to the planned course of therapy were reviewed.  Then, the patient was set-up supine.  CT images were obtained.  The CT images were loaded into the planning software.  Then the prostate and rectum were contoured.  Treatment planning then occurred.  The implanted iodine 125 seeds were identified by the physics staff for projection of radiation distribution  I have requested : 3D Simulation  I have requested a DVH of the following structures: Prostate and rectum.    ________________________________  Trilby Fujisawa Lorri Rota, M.D.

## 2024-01-15 NOTE — Progress Notes (Signed)
 Radiation Oncology         [phone] ________________________________  Name: Logan Chambers MRN: [id number]  Date: 01/15/2024  DOB: [birth year]  Post-Seed Follow-Up Visit Note  CC: Barnetta Liberty, MD  Andrez Banker, MD  Diagnosis:   73 y.o. gentleman with Stage T1c adenocarcinoma of the prostate with Gleason score of 3+4, and PSA of 9.83.     ICD-10-CM   1. Malignant neoplasm of prostate (HCC)  C61       Interval Since Last Radiation:  3 weeks 12/26/23:  Insertion of radioactive I-125 seeds into the prostate gland; 145 Gy, definitive therapy with placement of SpaceOAR gel.  Narrative:  The patient returns today for routine follow-up.  He is complaining of increased urinary frequency and urinary hesitation symptoms. He filled out a questionnaire regarding urinary function today providing and overall IPSS score of 12 characterizing his symptoms as moderate with increased daytime frequency, intermittency and nocturia x1.  He specifically denies dysuria, gross hematuria, straining to void or incontinence.  His pre-implant score was 11. He denies any abdominal pain or bowel symptoms.  He has not noticed any significant change in his energy level and overall, is quite pleased with his progress today.  ALLERGIES:  is allergic to lisinopril.  Meds: Current Outpatient Medications  Medication Sig Dispense Refill   latanoprost (XALATAN) 0.005 % ophthalmic solution Place 1 drop into both eyes at bedtime.     pantoprazole  (PROTONIX ) 40 MG tablet Take 1 tablet (40 mg total) by mouth daily. 30 tablet 1   tamsulosin  (FLOMAX ) 0.4 MG CAPS capsule Take 1 capsule (0.4 mg total) by mouth daily. 30 capsule 2   traMADol  (ULTRAM ) 50 MG tablet Take 1-2 tablets (50-100 mg total) by mouth every 6 (six) hours as needed for moderate pain (pain score 4-6). 15 tablet 0   No current facility-administered medications for this encounter.    Physical Findings: In general this is a well appearing African  American male in no acute distress. He's alert and oriented x4 and appropriate throughout the examination. Cardiopulmonary assessment is negative for acute distress and he exhibits normal effort.   Lab Findings: Lab Results  Component Value Date   WBC 4.3 12/17/2023   HGB 12.3 (L) 12/17/2023   HCT 35.4 (L) 12/17/2023   MCV 100.9 (H) 12/17/2023   PLT 246 12/17/2023    Radiographic Findings:  Patient underwent CT imaging in our clinic for post implant dosimetry. The CT will be fused with his prostate MRI that will be performed today and will be reviewed by Dr. Lorri Rota to confirm there is an adequate distribution of radioactive seeds throughout the prostate gland and ensure that there are no seeds in or near the rectum. We suspect the final radiation plan and dosimetry will show appropriate coverage of the prostate gland. He understands that we will call and inform him of any unexpected findings on further review of his imaging and dosimetry.  Impression/Plan: 73 y.o. gentleman with Stage T1c adenocarcinoma of the prostate with Gleason score of 3+4, and PSA of 9.83.  The patient is recovering from the effects of radiation. His urinary symptoms should gradually improve over the next 4-6 months. We talked about this today. He is encouraged by his improvement already and is otherwise pleased with his outcome. We also talked about long-term follow-up for prostate cancer following seed implant. He understands that ongoing PSA determinations and digital rectal exams will help perform surveillance to rule out disease recurrence. He has a follow up  appointment scheduled with Jacqulin Maus, NP at [facility] on 01/16/24 and will  then see Dr. Dulcy Gibney in 03/2024, following his initial post-treatment PSA to be drawn 1 week prior. He understands what to expect with his PSA measures. Patient was also educated today about some of the long-term effects from radiation including a small risk for rectal bleeding and  possibly erectile dysfunction. We talked about some of the general management approaches to these potential complications. However, I did encourage the patient to contact our office or return at any point if he has questions or concerns related to his previous radiation and prostate cancer.    Arta Bihari, PA-C

## 2024-01-20 ENCOUNTER — Encounter: Payer: Self-pay | Admitting: Radiation Oncology

## 2024-01-20 ENCOUNTER — Ambulatory Visit
Admission: RE | Admit: 2024-01-20 | Discharge: 2024-01-20 | Disposition: A | Discharge status: Home / Self Care | Source: Ambulatory Visit | Attending: Internal Medicine | Admitting: Internal Medicine

## 2024-01-20 ENCOUNTER — Ambulatory Visit
Admission: RE | Admit: 2024-01-20 | Discharge: 2024-01-20 | Discharge status: Home / Self Care | Source: Ambulatory Visit | Attending: Internal Medicine | Admitting: Internal Medicine

## 2024-01-20 DIAGNOSIS — R9389 Abnormal findings on diagnostic imaging of other specified body structures: Secondary | ICD-10-CM

## 2024-01-20 DIAGNOSIS — C61 Malignant neoplasm of prostate: Secondary | ICD-10-CM | POA: Diagnosis not present

## 2024-01-20 DIAGNOSIS — I739 Peripheral vascular disease, unspecified: Secondary | ICD-10-CM

## 2024-01-20 NOTE — Radiation Completion Notes (Signed)
 Patient Name: ULYSSES, ALPER MRN: [id number] Date of Birth: 1951-01-03 Referring Physician: BENJAMIN HERRICK, M.D. Date of Service: 2024-01-20 Radiation Oncologist: Bartholome Ligas, M.D. [facility] [place]                             RADIATION ONCOLOGY END OF TREATMENT NOTE     Diagnosis: C61 Malignant neoplasm of prostate Staging on 2023-10-14: Malignant neoplasm of prostate (HCC) T=cT1c, N=cN0, M=cM0 Intent: Curative     ==========DELIVERED PLANS==========  Prostate Seed Implant Date: 2023-12-26   Plan Name: Prostate Seed Implant Site: Prostate Technique: Radioactive Seed Implant I-125 Mode: Brachytherapy Dose Per Fraction: 145 Gy Prescribed Dose (Delivered / Prescribed): 145 Gy / 145 Gy Prescribed Fxs (Delivered / Prescribed): 1 / 1     ==========ON TREATMENT VISIT DATES========== 2023-12-26     ==========UPCOMING VISITS==========

## 2024-01-21 NOTE — Progress Notes (Signed)
  Radiation Oncology         [phone] ________________________________  Name: Logan Chambers MRN: [id number]  Date: 01/20/2024  DOB: 01/06/1951  3D Planning Note   Prostate Brachytherapy Post-Implant Dosimetry  Diagnosis: 73 y.o. gentleman with Stage T1c adenocarcinoma of the prostate with Gleason score of 3+4, and PSA of 9.83.   Narrative: On a previous date, Logan Chambers returned following prostate seed implantation for post implant planning. He underwent CT scan complex simulation to delineate the three-dimensional structures of the pelvis and demonstrate the radiation distribution.  Since that time, the seed localization, and complex isodose planning with dose volume histograms have now been completed.  Results:   Prostate Coverage - The dose of radiation delivered to the 90% or more of the prostate gland (D90) was 117.43% of the prescription dose. This exceeds our goal of greater than 90%. Rectal Sparing - The volume of rectal tissue receiving the prescription dose or higher was 0.0 cc. This falls under our thresholds tolerance of 1.0 cc.  Impression: The prostate seed implant appears to show adequate target coverage and appropriate rectal sparing.  Plan:  The patient will continue to follow with urology for ongoing PSA determinations. I would anticipate a high likelihood for local tumor control with minimal risk for rectal morbidity.  ________________________________  Trilby Fujisawa Lorri Rota, M.D.

## 2024-01-26 ENCOUNTER — Encounter: Payer: Self-pay | Admitting: *Deleted

## 2024-02-03 ENCOUNTER — Ambulatory Visit: Attending: Vascular Surgery | Admitting: Vascular Surgery

## 2024-02-03 ENCOUNTER — Encounter: Payer: Self-pay | Admitting: Vascular Surgery

## 2024-02-03 VITALS — BP 151/86 | HR 70 | Temp 97.8°F | Ht 74.0 in | Wt 135.0 lb

## 2024-02-03 DIAGNOSIS — I70211 Atherosclerosis of native arteries of extremities with intermittent claudication, right leg: Secondary | ICD-10-CM

## 2024-02-03 NOTE — Progress Notes (Signed)
 [facility] [place]  ASSESSMENT / PLAN: Logan Chambers is a 73 y.o. 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  Patient counseled patients with asymptomatic peripheral arterial disease or claudication have a 1-2% risk of developing chronic limb threatening ischemia, but a 15-30% risk of mortality in the next 5 years. Intervention should only be considered for medically optimized patients with disabling symptoms.   Recommend:  Abstinence from all tobacco products. Blood glucose control with goal A1c < 7%. Blood pressure control with goal blood pressure < 140/90 mmHg. Lipid reduction therapy with goal LDL-C 55mg /dL Aspirin 81mg  PO QD.  Atorvastatin 40-80mg  PO QD (or other "high intensity" statin therapy). Daily walking  Follow up in 3 months to evaluate medical therapy for PAD.  CHIEF COMPLAINT: cramping pain with walking  HISTORY OF PRESENT ILLNESS: Logan Chambers is a 73 y.o. male referred to clinic for evaluation of cramping pain with walking.  The patient reports fairly typical symptoms of intermittent claudication in the right calf.  He reports pain begins after about a block.  The pain is reliably relieved with rest.  He does not have any symptoms of ischemic rest pain.  He has no ulcers about his feet.  He is a lifelong smoker.  He is trying to quit.   Past Medical History:  Diagnosis Date   Alcohol-induced pancreatitis    Arm fracture, left    Arthritis    Cancer (HCC)    Hx of adenomatous polyp of colon 06/12/2020   Hypertension    Normocytic anemia    Pancreatic mass-cystic    Plantar fasciitis     Past Surgical History:  Procedure Laterality Date   BIOPSY  09/09/2022   Procedure: BIOPSY;  Surgeon: Normie Becton., MD;  Location: [facility];  Service: Gastroenterology;;   ESOPHAGOGASTRODUODENOSCOPY N/A 09/09/2022   Procedure: ESOPHAGOGASTRODUODENOSCOPY (EGD);  Surgeon:  Normie Becton., MD;  Location: [facility];  Service: Gastroenterology;  Laterality: N/A;   EUS N/A 09/09/2022   Procedure: UPPER ENDOSCOPIC ULTRASOUND (EUS) RADIAL;  Surgeon: Normie Becton., MD;  Location: [facility];  Service: Gastroenterology;  Laterality: N/A;   FINE NEEDLE ASPIRATION N/A 09/09/2022   Procedure: FINE NEEDLE ASPIRATION (FNA) LINEAR;  Surgeon: Normie Becton., MD;  Location: [facility];  Service: Gastroenterology;  Laterality: N/A;   HOT HEMOSTASIS N/A 09/09/2022   Procedure: HOT HEMOSTASIS (ARGON PLASMA COAGULATION/BICAP);  Surgeon: Normie Becton., MD;  Location: [facility];  Service: Gastroenterology;  Laterality: N/A;   IR PARACENTESIS  02/28/2022   PROSTATE BIOPSY     RADIOACTIVE SEED IMPLANT N/A 12/26/2023   Procedure: INSERTION, RADIATION SOURCE, PROSTATE;  Surgeon: Andrez Banker, MD;  Location: [facility];  Service: Urology;  Laterality: N/A;  RADIOACTIVE SEED IMPLANT AND SPACE OAR   SPACE OAR INSTILLATION N/A 12/26/2023   Procedure: INJECTION, HYDROGEL SPACER;  Surgeon: Andrez Banker, MD;  Location: [facility];  Service: Urology;  Laterality: N/A;    Family History  Problem Relation Age of Onset   Colon cancer Neg Hx    Rectal cancer Neg Hx    Stomach cancer Neg Hx    Pancreatic cancer Neg Hx    Liver disease Neg Hx     Social History   Socioeconomic History   Marital status: Single    Spouse name: Not on file   Number of children: 2   Years of education: Not on file   Highest education level: Not on file  Occupational History   Not on file  Tobacco Use   Smoking status: Every Day    Current packs/day: 1.00    Average packs/day: 1 pack/day for 50.0 years (50.0 ttl pk-yrs)    Types: Cigarettes    Start date: 02/02/1974    Passive exposure: Never   Smokeless tobacco: Never   Tobacco comments:    Since 02/27/22  Vaping Use   Vaping status: Never Used  Substance and Sexual Activity   Alcohol use: Yes     Alcohol/week: 4.0 - 5.0 standard drinks of alcohol    Types: 4 - 5 Cans of beer per week    Comment: 2 40 oz per day    Drug use: No   Sexual activity: Not on file  Other Topics Concern   Not on file  Social History Narrative   The patient lives alone, he has 2 supportive daughters in [place]   Former significant alcohol use and smoking history stopped 2023   Social Drivers of Health   Financial Resource Strain: Not on file  Food Insecurity: No Food Insecurity (01/14/2024)   Hunger Vital Sign    Worried About Running Out of Food in the Last Year: Never true    Ran Out of Food in the Last Year: Never true  Transportation Needs: No Transportation Needs (01/14/2024)   PRAPARE - Administrator, Civil Service (Medical): No    Lack of Transportation (Non-Medical): No  Physical Activity: Not on file  Stress: Not on file  Social Connections: Not on file  Intimate Partner Violence: Not At Risk (01/14/2024)   Humiliation, Afraid, Rape, and Kick questionnaire    Fear of Current or Ex-Partner: No    Emotionally Abused: No    Physically Abused: No    Sexually Abused: No    Allergies  Allergen Reactions   Lisinopril Rash    Current Outpatient Medications  Medication Sig Dispense Refill   aspirin EC 81 MG tablet 1 tablet Orally Once a day     latanoprost (XALATAN) 0.005 % ophthalmic solution Place 1 drop into both eyes at bedtime.     tamsulosin  (FLOMAX ) 0.4 MG CAPS capsule Take 1 capsule (0.4 mg total) by mouth daily. 30 capsule 2   traMADol  (ULTRAM ) 50 MG tablet Take 1-2 tablets (50-100 mg total) by mouth every 6 (six) hours as needed for moderate pain (pain score 4-6). 15 tablet 0   pantoprazole  (PROTONIX ) 40 MG tablet Take 1 tablet (40 mg total) by mouth daily. 30 tablet 1   No current facility-administered medications for this visit.    PHYSICAL EXAM Vitals:   02/03/24 0931  BP: (!) 151/86  Pulse: 70  Temp: 97.8 F (36.6 C)  SpO2: 98%  Weight: 135 lb (61.2  kg)  Height: 6\' 2"  (1.88 m)   Chronically ill older man in no distress Regular rate and rhythm Unlabored breathing No palpable pedal pulses  PERTINENT LABORATORY AND RADIOLOGIC DATA  Most recent CBC    Latest Ref Rng & Units 12/17/2023   10:14 AM 05/01/2023   12:12 PM 12/10/2022    4:05 PM  CBC  WBC 4.0 - 10.5 K/uL 4.3  4.9  4.5   Hemoglobin 13.0 - 17.0 g/dL 16.1  09.6  04.5   Hematocrit 39.0 - 52.0 % 35.4  39.7  40.0   Platelets 150 - 400 K/uL 246  199.0  222.0      Most recent CMP    Latest Ref Rng & Units  12/17/2023   10:14 AM 05/01/2023   12:12 PM 12/10/2022    4:05 PM  CMP  Glucose 70 - 99 mg/dL 82  78  90   BUN 8 - 23 mg/dL 5  4  5    Creatinine 0.61 - 1.24 mg/dL 4.09  8.11  9.14   Sodium 135 - 145 mmol/L 125  127  130   Potassium 3.5 - 5.1 mmol/L 3.9  3.4  3.9   Chloride 98 - 111 mmol/L 92  93  96   CO2 22 - 32 mmol/L 20  27  28    Calcium 8.9 - 10.3 mg/dL 8.9  9.2  9.0   Total Protein 6.0 - 8.3 g/dL  7.9  7.8   Total Bilirubin 0.2 - 1.2 mg/dL  0.7  0.5   Alkaline Phos 39 - 117 U/L  80  185   AST 0 - 37 U/L  18  18   ALT 0 - 53 U/L  10  7    CLINICAL DATA:  Peripheral arterial disease. Current smoker. Hypertension. RIGHT calf feels warm when walking for the past 6 months. Pain improves with elevation of the feet.   EXAM: NONINVASIVE PHYSIOLOGIC VASCULAR STUDY OF BILATERAL LOWER EXTREMITIES   TECHNIQUE: Evaluation of both lower extremities were performed at rest, including calculation of ankle-brachial indices with single level pressure measurements and doppler and pulse volume recording.   COMPARISON:  None available.   FINDINGS: Right ABI:  0.63   Right TBI: 0.47   Left ABI:  0.81   Left TBI: 0.64   Right Lower Extremity: Monophasic waveforms at the posterior tibial and dorsalis pedis arteries.   Left Lower Extremity: Monophasic waveforms at the posterior tibial and dorsalis pedis arteries.   0.5-0.79 Moderate PAD   IMPRESSION: Ankle  brachial indices consistent with mild LEFT and moderate RIGHT lower extremity peripheral disease.     Electronically Signed   By: Elester Grim M.D.   On: 01/20/2024 16:52  Heber Little. Edgardo Goodwill, MD FACS [facility] Phone Number: [phone] 02/03/2024 11:48 AM   Total time spent on preparing this encounter including chart review, data review, collecting history, examining the patient, and coordinating care: 45 minutes  Portions of this report may have been transcribed using voice recognition software.  Every effort has been made to ensure accuracy; however, inadvertent computerized transcription errors may still be present.

## 2024-02-04 ENCOUNTER — Other Ambulatory Visit: Payer: Self-pay

## 2024-02-04 DIAGNOSIS — I70211 Atherosclerosis of native arteries of extremities with intermittent claudication, right leg: Secondary | ICD-10-CM

## 2024-02-24 ENCOUNTER — Encounter: Payer: Self-pay | Admitting: *Deleted

## 2024-02-24 NOTE — Progress Notes (Signed)
 SCP summary completed and mailed to patient.

## 2024-03-02 ENCOUNTER — Telehealth: Payer: Self-pay | Admitting: Nutrition

## 2024-03-02 NOTE — Telephone Encounter (Signed)
 See telephone note

## 2024-03-08 ENCOUNTER — Encounter: Payer: Self-pay | Admitting: *Deleted

## 2024-03-08 ENCOUNTER — Inpatient Hospital Stay: Attending: Adult Health | Admitting: *Deleted

## 2024-03-08 DIAGNOSIS — C61 Malignant neoplasm of prostate: Secondary | ICD-10-CM

## 2024-03-08 NOTE — Progress Notes (Signed)
 SCP reviewed and completed. Two identifiers were used as this was not an in -person visit. No complaints of pain today. Allergies and meds reviewed and updated. Pt admits to smoking 1/4 pack of cigarettes per day and drinks 4 cans of beer a week. Pt had last lung cancer screening  01/20/2024. Last colonoscopy was 06/13/2020. Pt does not take the flu vaccine by choice. He is updated on Tdap, given 11/03/2017. Pt will see Dr.Herrick in  September at [facility]. A copy of prostate summary was sent to pt's PCP.

## 2024-03-29 ENCOUNTER — Encounter (HOSPITAL_COMMUNITY): Payer: Self-pay

## 2024-03-29 ENCOUNTER — Emergency Department (HOSPITAL_COMMUNITY)

## 2024-03-29 ENCOUNTER — Inpatient Hospital Stay (HOSPITAL_COMMUNITY)
Admission: EM | Admit: 2024-03-29 | Discharge: 2024-04-01 | DRG: 438 | Disposition: A | Discharge status: Home / Self Care | Attending: Student in an Organized Health Care Education/Training Program | Admitting: Student in an Organized Health Care Education/Training Program

## 2024-03-29 ENCOUNTER — Other Ambulatory Visit: Payer: Self-pay

## 2024-03-29 DIAGNOSIS — Z8546 Personal history of malignant neoplasm of prostate: Secondary | ICD-10-CM

## 2024-03-29 DIAGNOSIS — I160 Hypertensive urgency: Secondary | ICD-10-CM | POA: Diagnosis present

## 2024-03-29 DIAGNOSIS — Z79899 Other long term (current) drug therapy: Secondary | ICD-10-CM

## 2024-03-29 DIAGNOSIS — Z91148 Patient's other noncompliance with medication regimen for other reason: Secondary | ICD-10-CM | POA: Diagnosis not present

## 2024-03-29 DIAGNOSIS — Z888 Allergy status to other drugs, medicaments and biological substances status: Secondary | ICD-10-CM

## 2024-03-29 DIAGNOSIS — I10 Essential (primary) hypertension: Secondary | ICD-10-CM | POA: Diagnosis present

## 2024-03-29 DIAGNOSIS — F1020 Alcohol dependence, uncomplicated: Secondary | ICD-10-CM | POA: Diagnosis present

## 2024-03-29 DIAGNOSIS — K831 Obstruction of bile duct: Secondary | ICD-10-CM | POA: Diagnosis present

## 2024-03-29 DIAGNOSIS — E222 Syndrome of inappropriate secretion of antidiuretic hormone: Secondary | ICD-10-CM | POA: Diagnosis present

## 2024-03-29 DIAGNOSIS — K8500 Idiopathic acute pancreatitis without necrosis or infection: Secondary | ICD-10-CM | POA: Diagnosis not present

## 2024-03-29 DIAGNOSIS — K852 Alcohol induced acute pancreatitis without necrosis or infection: Secondary | ICD-10-CM | POA: Diagnosis not present

## 2024-03-29 DIAGNOSIS — Z681 Body mass index (BMI) 19 or less, adult: Secondary | ICD-10-CM | POA: Diagnosis not present

## 2024-03-29 DIAGNOSIS — E871 Hypo-osmolality and hyponatremia: Secondary | ICD-10-CM | POA: Diagnosis present

## 2024-03-29 DIAGNOSIS — Z87891 Personal history of nicotine dependence: Secondary | ICD-10-CM

## 2024-03-29 DIAGNOSIS — Z91199 Patient's noncompliance with other medical treatment and regimen due to unspecified reason: Secondary | ICD-10-CM

## 2024-03-29 DIAGNOSIS — E43 Unspecified severe protein-calorie malnutrition: Secondary | ICD-10-CM | POA: Diagnosis present

## 2024-03-29 DIAGNOSIS — E876 Hypokalemia: Secondary | ICD-10-CM | POA: Diagnosis not present

## 2024-03-29 DIAGNOSIS — K860 Alcohol-induced chronic pancreatitis: Secondary | ICD-10-CM | POA: Diagnosis present

## 2024-03-29 DIAGNOSIS — F1090 Alcohol use, unspecified, uncomplicated: Secondary | ICD-10-CM | POA: Insufficient documentation

## 2024-03-29 DIAGNOSIS — K219 Gastro-esophageal reflux disease without esophagitis: Secondary | ICD-10-CM | POA: Diagnosis present

## 2024-03-29 DIAGNOSIS — K859 Acute pancreatitis without necrosis or infection, unspecified: Secondary | ICD-10-CM | POA: Diagnosis present

## 2024-03-29 DIAGNOSIS — K862 Cyst of pancreas: Secondary | ICD-10-CM | POA: Diagnosis present

## 2024-03-29 LAB — CBC
HCT: 35.3 % — ABNORMAL LOW (ref 39.0–52.0)
Hemoglobin: 12.4 g/dL — ABNORMAL LOW (ref 13.0–17.0)
MCH: 32 pg (ref 26.0–34.0)
MCHC: 35.1 g/dL (ref 30.0–36.0)
MCV: 91 fL (ref 80.0–100.0)
Platelets: 245 K/uL (ref 150–400)
RBC: 3.88 MIL/uL — ABNORMAL LOW (ref 4.22–5.81)
RDW: 15.9 % — ABNORMAL HIGH (ref 11.5–15.5)
WBC: 5.4 K/uL (ref 4.0–10.5)
nRBC: 0 % (ref 0.0–0.2)

## 2024-03-29 LAB — COMPREHENSIVE METABOLIC PANEL WITH GFR
ALT: 12 U/L (ref 0–44)
AST: 16 U/L (ref 15–41)
Albumin: 3.6 g/dL (ref 3.5–5.0)
Alkaline Phosphatase: 161 U/L — ABNORMAL HIGH (ref 38–126)
Anion gap: 13 (ref 5–15)
BUN: 7 mg/dL — ABNORMAL LOW (ref 8–23)
CO2: 23 mmol/L (ref 22–32)
Calcium: 9.5 mg/dL (ref 8.9–10.3)
Chloride: 89 mmol/L — ABNORMAL LOW (ref 98–111)
Creatinine, Ser: 0.7 mg/dL (ref 0.61–1.24)
GFR, Estimated: >60 mL/min (ref >60)
Glucose, Bld: 105 mg/dL — ABNORMAL HIGH (ref 70–99)
Potassium: 4.2 mmol/L (ref 3.5–5.1)
Sodium: 125 mmol/L — ABNORMAL LOW (ref 135–145)
Total Bilirubin: 1.1 mg/dL (ref 0.0–1.2)
Total Protein: 7.7 g/dL (ref 6.5–8.1)

## 2024-03-29 LAB — OSMOLALITY: Osmolality: 258 mosm/kg — ABNORMAL LOW (ref 275–295)

## 2024-03-29 LAB — LIPASE, BLOOD: Lipase: 101 U/L — ABNORMAL HIGH (ref 11–51)

## 2024-03-29 LAB — URINALYSIS, ROUTINE W REFLEX MICROSCOPIC
Bilirubin Urine: NEGATIVE
Glucose, UA: NEGATIVE mg/dL
Hgb urine dipstick: NEGATIVE
Ketones, ur: 20 mg/dL — AB
Leukocytes,Ua: NEGATIVE
Nitrite: NEGATIVE
Protein, ur: NEGATIVE mg/dL
Specific Gravity, Urine: >1.046 — ABNORMAL HIGH (ref 1.005–1.030)
pH: 6 (ref 5.0–8.0)

## 2024-03-29 LAB — BASIC METABOLIC PANEL WITH GFR
Anion gap: 12 (ref 5–15)
BUN: 8 mg/dL (ref 8–23)
CO2: 22 mmol/L (ref 22–32)
Calcium: 8.9 mg/dL (ref 8.9–10.3)
Chloride: 90 mmol/L — ABNORMAL LOW (ref 98–111)
Creatinine, Ser: 0.57 mg/dL — ABNORMAL LOW (ref 0.61–1.24)
GFR, Estimated: >60 mL/min (ref >60)
Glucose, Bld: 78 mg/dL (ref 70–99)
Potassium: 4.2 mmol/L (ref 3.5–5.1)
Sodium: 124 mmol/L — ABNORMAL LOW (ref 135–145)

## 2024-03-29 LAB — SODIUM, URINE, RANDOM: Sodium, Ur: 67 mmol/L

## 2024-03-29 LAB — OSMOLALITY, URINE: Osmolality, Ur: 654 mosm/kg (ref 300–900)

## 2024-03-29 LAB — MAGNESIUM: Magnesium: 1.6 mg/dL — ABNORMAL LOW (ref 1.7–2.4)

## 2024-03-29 MED ORDER — ADULT MULTIVITAMIN W/MINERALS CH
1.0000 | ORAL_TABLET | Freq: Every day | ORAL | Status: DC
  Administered 2024-03-30 – 2024-04-01 (×3): 1 via ORAL
  Filled 2024-03-29 (×3): qty 1

## 2024-03-29 MED ORDER — MORPHINE SULFATE (PF) 2 MG/ML IV SOLN: 1.0000 mg | INTRAVENOUS | Status: DC | PRN

## 2024-03-29 MED ORDER — MAGNESIUM SULFATE 2 GM/50ML IV SOLN
2.0000 g | Freq: Once | INTRAVENOUS | Status: AC
  Administered 2024-03-30: 2 g via INTRAVENOUS
  Filled 2024-03-29: qty 50

## 2024-03-29 MED ORDER — HYDRALAZINE HCL 20 MG/ML IJ SOLN
10.0000 mg | Freq: Once | INTRAMUSCULAR | Status: AC
  Administered 2024-03-29: 10 mg via INTRAVENOUS
  Filled 2024-03-29: qty 1

## 2024-03-29 MED ORDER — LATANOPROST 0.005 % OP SOLN
1.0000 [drp] | Freq: Every day | OPHTHALMIC | Status: DC
  Administered 2024-03-30 – 2024-03-31 (×2): 1 [drp] via OPHTHALMIC
  Filled 2024-03-29 (×2): qty 2.5

## 2024-03-29 MED ORDER — SODIUM CHLORIDE 0.9 % IV BOLUS
500.0000 mL | Freq: Once | INTRAVENOUS | Status: AC
  Administered 2024-03-29: 500 mL via INTRAVENOUS

## 2024-03-29 MED ORDER — LORAZEPAM 2 MG/ML IJ SOLN: 1.0000 mg | INTRAMUSCULAR | Status: DC | PRN

## 2024-03-29 MED ORDER — OXYCODONE HCL 5 MG PO TABS: 5.0000 mg | ORAL_TABLET | Freq: Four times a day (QID) | ORAL | Status: DC | PRN

## 2024-03-29 MED ORDER — HYDRALAZINE HCL 20 MG/ML IJ SOLN
10.0000 mg | INTRAMUSCULAR | Status: DC | PRN
  Administered 2024-03-30: 10 mg via INTRAVENOUS
  Filled 2024-03-29: qty 1

## 2024-03-29 MED ORDER — ACETAMINOPHEN 650 MG RE SUPP: 650.0000 mg | Freq: Four times a day (QID) | RECTAL | Status: DC | PRN

## 2024-03-29 MED ORDER — PANTOPRAZOLE SODIUM 40 MG PO TBEC
40.0000 mg | DELAYED_RELEASE_TABLET | Freq: Every day | ORAL | Status: DC
  Administered 2024-03-30 – 2024-04-01 (×3): 40 mg via ORAL
  Filled 2024-03-29 (×3): qty 1

## 2024-03-29 MED ORDER — THIAMINE MONONITRATE 100 MG PO TABS
100.0000 mg | ORAL_TABLET | Freq: Every day | ORAL | Status: DC
  Administered 2024-03-30 – 2024-04-01 (×3): 100 mg via ORAL
  Filled 2024-03-29 (×3): qty 1

## 2024-03-29 MED ORDER — SODIUM CHLORIDE 0.9 % IV SOLN: INTRAVENOUS | Status: AC

## 2024-03-29 MED ORDER — IOHEXOL 350 MG/ML SOLN
75.0000 mL | Freq: Once | INTRAVENOUS | Status: AC | PRN
  Administered 2024-03-29: 75 mL via INTRAVENOUS

## 2024-03-29 MED ORDER — ACETAMINOPHEN 325 MG PO TABS: 650.0000 mg | ORAL_TABLET | Freq: Four times a day (QID) | ORAL | Status: DC | PRN

## 2024-03-29 MED ORDER — NALOXONE HCL 0.4 MG/ML IJ SOLN: 0.4000 mg | INTRAMUSCULAR | Status: DC | PRN

## 2024-03-29 MED ORDER — THIAMINE HCL 100 MG/ML IJ SOLN
100.0000 mg | Freq: Every day | INTRAMUSCULAR | Status: DC
  Filled 2024-03-29: qty 2

## 2024-03-29 MED ORDER — LORAZEPAM 1 MG PO TABS: 1.0000 mg | ORAL_TABLET | ORAL | Status: DC | PRN

## 2024-03-29 MED ORDER — FOLIC ACID 1 MG PO TABS
1.0000 mg | ORAL_TABLET | Freq: Every day | ORAL | Status: DC
  Administered 2024-03-30 – 2024-04-01 (×3): 1 mg via ORAL
  Filled 2024-03-29 (×3): qty 1

## 2024-03-29 NOTE — ED Notes (Signed)
 Called and placed PT on monitor with CCMD.

## 2024-03-29 NOTE — H&P (Signed)
 History and Physical    Logan Chambers FMW:[id number] DOB: 12-Dec-1950 DOA: 03/29/2024  PCP: Larnell Hamilton, MD  Patient coming from: Home  Chief Complaint: Abdominal pain  HPI: Logan Chambers is a 73 y.o. male with medical history significant of alcohol abuse, alcoholic pancreatitis, pancreatic cystic lesion, hypertension, prostate cancer status post radioactive seed implant, tobacco abuse, GERD, arthritis, chronic hyponatremia presenting with a chief complaint of epigastric abdominal pain.  Patient is reporting 4-day history of progressively worsening epigastric abdominal pain but denies nausea or vomiting.  He normally drinks two 40 ounce beers daily but has not been able to eat or drink for the past few days.  Denies fevers or chills.  He stopped taking his home antihypertensive medication 3 years ago and does not check his blood pressure at home.  No other complaints.  ED Course: Blood pressure elevated with systolic in the 200s, remainder of vital signs stable.  Labs showing no leukocytosis, hemoglobin 12.4 (stable), sodium 124, chloride 90, alk phos 161, transaminases and T. bili normal, lipase 101, UA not suggestive of infection, magnesium  1.6, urine osmolarity 654, urine sodium 67, serum osmolarity 258.  CT showing findings consistent with interstitial pancreatitis but no walled off fluid collection seen.  Also showing new mild intrahepatic bile duct dilation and there is also increased dilation of the proximal extrahepatic bile duct measuring up to 1.3 cm, however, it gradually tapers in the middle third portion and completely obliterated in the distal third portion.  No calcified gallstones seen.  There is increased dilation of the main pancreatic duct measuring up to 8 mm but no obstructing mass seen.  Patient was given IV hydralazine  10 mg and 1 L normal saline.  Review of Systems:  Review of Systems  All other systems reviewed and are negative.   Past Medical History:  Diagnosis Date    Alcohol-induced pancreatitis    Arm fracture, left    Arthritis    Cancer (HCC)    Hx of adenomatous polyp of colon 06/12/2020   Hypertension    Normocytic anemia    Pancreatic mass-cystic    Plantar fasciitis     Past Surgical History:  Procedure Laterality Date   BIOPSY  09/09/2022   Procedure: BIOPSY;  Surgeon: Wilhelmenia Aloha Raddle., MD;  Location: [facility];  Service: Gastroenterology;;   ESOPHAGOGASTRODUODENOSCOPY N/A 09/09/2022   Procedure: ESOPHAGOGASTRODUODENOSCOPY (EGD);  Surgeon: Wilhelmenia Aloha Raddle., MD;  Location: [facility];  Service: Gastroenterology;  Laterality: N/A;   EUS N/A 09/09/2022   Procedure: UPPER ENDOSCOPIC ULTRASOUND (EUS) RADIAL;  Surgeon: Wilhelmenia Aloha Raddle., MD;  Location: [facility];  Service: Gastroenterology;  Laterality: N/A;   FINE NEEDLE ASPIRATION N/A 09/09/2022   Procedure: FINE NEEDLE ASPIRATION (FNA) LINEAR;  Surgeon: Wilhelmenia Aloha Raddle., MD;  Location: [facility];  Service: Gastroenterology;  Laterality: N/A;   HOT HEMOSTASIS N/A 09/09/2022   Procedure: HOT HEMOSTASIS (ARGON PLASMA COAGULATION/BICAP);  Surgeon: Wilhelmenia Aloha Raddle., MD;  Location: [facility];  Service: Gastroenterology;  Laterality: N/A;   IR PARACENTESIS  02/28/2022   PROSTATE BIOPSY     RADIOACTIVE SEED IMPLANT N/A 12/26/2023   Procedure: INSERTION, RADIATION SOURCE, PROSTATE;  Surgeon: Cam Morene ORN, MD;  Location: [facility];  Service: Urology;  Laterality: N/A;  RADIOACTIVE SEED IMPLANT AND SPACE OAR   SPACE OAR INSTILLATION N/A 12/26/2023   Procedure: INJECTION, HYDROGEL SPACER;  Surgeon: Cam Morene ORN, MD;  Location: [facility];  Service: Urology;  Laterality: N/A;     reports that he has been smoking  cigarettes. He started smoking about 50 years ago. He has a 50.2 pack-year smoking history. He has never been exposed to tobacco smoke. He has never used smokeless tobacco. He reports current alcohol use of about 4.0 - 5.0 standard drinks of  alcohol per week. He reports that he does not use drugs.  Allergies  Allergen Reactions   Lisinopril Rash    Family History  Problem Relation Age of Onset   Colon cancer Neg Hx    Rectal cancer Neg Hx    Stomach cancer Neg Hx    Pancreatic cancer Neg Hx    Liver disease Neg Hx     Prior to Admission medications   Medication Sig Start Date End Date Taking? Authorizing Provider  aspirin EC 81 MG tablet 1 tablet Orally Once a day 01/22/24  Yes [provider]  latanoprost  (XALATAN ) 0.005 % ophthalmic solution Place 1 drop into both eyes at bedtime. 11/04/23  Yes [provider]  pantoprazole  (PROTONIX ) 40 MG tablet Take 1 tablet (40 mg total) by mouth daily. 03/07/22 03/29/25 Yes Patel, Pranav M, MD  vitamin B-12 (CYANOCOBALAMIN) 500 MCG tablet Take 5,000 mcg by mouth daily.   Yes [provider]  tamsulosin  (FLOMAX ) 0.4 MG CAPS capsule Take 1 capsule (0.4 mg total) by mouth daily. Patient not taking: Reported on 03/29/2024 12/26/23   Cam Morene ORN, MD    Physical Exam: Vitals:   03/29/24 1930 03/29/24 1945 03/29/24 2010 03/29/24 2045  BP: (!) 205/96 (!) 200/105 (!) 166/95 (!) 182/96  Pulse: 68 65 63 72  Resp:   20   Temp:      TempSrc:      SpO2: 100% 100% 100% 100%    Physical Exam Vitals reviewed.  Constitutional:      General: He is not in acute distress. HENT:     Head: Normocephalic and atraumatic.  Eyes:     Extraocular Movements: Extraocular movements intact.  Cardiovascular:     Rate and Rhythm: Normal rate and regular rhythm.     Heart sounds: Normal heart sounds.  Pulmonary:     Effort: Pulmonary effort is normal. No respiratory distress.     Breath sounds: Normal breath sounds.  Abdominal:     General: Bowel sounds are normal. There is no distension.     Palpations: Abdomen is soft.     Tenderness: There is no abdominal tenderness. There is no guarding.  Musculoskeletal:     Cervical back: Normal range of motion.     Right  lower leg: No edema.     Left lower leg: No edema.  Skin:    General: Skin is warm and dry.  Neurological:     General: No focal deficit present.     Mental Status: He is alert and oriented to person, place, and time.     Labs on Admission: I have personally reviewed following labs and imaging studies  CBC: Recent Labs  Lab 03/29/24 1128  WBC 5.4  HGB 12.4*  HCT 35.3*  MCV 91.0  PLT 245   Basic Metabolic Panel: Recent Labs  Lab 03/29/24 1128 03/29/24 1556  NA 125* 124*  K 4.2 4.2  CL 89* 90*  CO2 23 22  GLUCOSE 105* 78  BUN 7* 8  CREATININE 0.70 0.57*  CALCIUM 9.5 8.9  MG 1.6*  --    GFR: CrCl cannot be calculated (Unknown ideal weight.). Liver Function Tests: Recent Labs  Lab 03/29/24 1128  AST 16  ALT 12  ALKPHOS 161*  BILITOT 1.1  PROT 7.7  ALBUMIN 3.6   Recent Labs  Lab 03/29/24 1128  LIPASE 101*   No results for input(s): AMMONIA in the last 168 hours. Coagulation Profile: No results for input(s): INR, PROTIME in the last 168 hours. Cardiac Enzymes: No results for input(s): CKTOTAL, CKMB, CKMBINDEX, TROPONINI in the last 168 hours. BNP (last 3 results) No results for input(s): PROBNP in the last 8760 hours. HbA1C: No results for input(s): HGBA1C in the last 72 hours. CBG: No results for input(s): GLUCAP in the last 168 hours. Lipid Profile: No results for input(s): CHOL, HDL, LDLCALC, TRIG, CHOLHDL, LDLDIRECT in the last 72 hours. Thyroid Function Tests: No results for input(s): TSH, T4TOTAL, FREET4, T3FREE, THYROIDAB in the last 72 hours. Anemia Panel: No results for input(s): VITAMINB12, FOLATE, FERRITIN, TIBC, IRON, RETICCTPCT in the last 72 hours. Urine analysis:    Component Value Date/Time   COLORURINE YELLOW 03/29/2024 1128   APPEARANCEUR CLEAR 03/29/2024 1128   LABSPEC >1.046 (H) 03/29/2024 1128   PHURINE 6.0 03/29/2024 1128   GLUCOSEU NEGATIVE 03/29/2024 1128   HGBUR  NEGATIVE 03/29/2024 1128   BILIRUBINUR NEGATIVE 03/29/2024 1128   KETONESUR 20 (A) 03/29/2024 1128   PROTEINUR NEGATIVE 03/29/2024 1128   NITRITE NEGATIVE 03/29/2024 1128   LEUKOCYTESUR NEGATIVE 03/29/2024 1128    Radiological Exams on Admission: CT ABDOMEN PELVIS W CONTRAST Result Date: 03/29/2024 CLINICAL DATA:  epigastric pain. elevated lipase. Eval for pancreatitis or other pathology. EXAM: CT ABDOMEN AND PELVIS WITH CONTRAST TECHNIQUE: Multidetector CT imaging of the abdomen and pelvis was performed using the standard protocol following bolus administration of intravenous contrast. RADIATION DOSE REDUCTION: This exam was performed according to the departmental dose-optimization program which includes automated exposure control, adjustment of the mA and/or kV according to patient size and/or use of iterative reconstruction technique. CONTRAST:  75mL OMNIPAQUE  IOHEXOL  350 MG/ML SOLN COMPARISON:  CT scan abdomen from 04/03/2023. FINDINGS: Lower chest: There are emphysematous changes in the visualized lung bases. The lung bases are otherwise clear. No pleural effusion. The heart is normal in size. No pericardial effusion. Hepatobiliary: The liver is normal in size. Non-cirrhotic configuration. No suspicious mass. Mild intrahepatic bile duct dilation, increased since the prior study. There is mildly dilated proximal extrahepatic bile duct measuring up to 1.3 cm however, it gradually tapers in the middle third portion and completely obliterated in the distal third portion. No calcified gallstones. Normal gallbladder wall thickness. No pericholecystic inflammatory changes. Pancreas: There is new heterogeneous appearance of the pancreas which appears swollen in comparison to the prior exam from 04/03/2023. There is mild fat stranding surrounding the pancreas (asymmetrically involving the head/uncinate process), which is also new. Findings favor interstitial pancreatitis. Redemonstration of dilation of main  pancreatic duct measuring up to 8 mm in the pancreatic neck region, increased since the prior study. However, no discrete obstructing mass seen. Spleen: Within normal limits. No focal lesion. Adrenals/Urinary Tract: Adrenal glands are unremarkable. No suspicious renal mass. No hydronephrosis. No renal or ureteric calculi. Urinary bladder is under distended, precluding optimal assessment. However, no large mass or stones identified. No perivesical fat stranding. Stomach/Bowel: No disproportionate dilation of the small or large bowel loops. No evidence of abnormal bowel wall thickening or inflammatory changes. The appendix was not visualized; however there is no acute inflammatory process in the right lower quadrant. Vascular/Lymphatic: There is trace amount of ascites in the dependent pelvis. No walled-off abscess. No pneumoperitoneum. No abdominal or pelvic lymphadenopathy, by size criteria. No  aneurysmal dilation of the major abdominal arteries. There are marked peripheral atherosclerotic vascular calcifications of the aorta and its major branches. Reproductive: Normal-sized prostate gland. Multiple prostatic radiation seeds noted. Symmetric bilateral seminal vesicles. Other: The visualized soft tissues and abdominal wall are unremarkable. Musculoskeletal: No suspicious osseous lesions. There are mild multilevel degenerative changes in the visualized spine. IMPRESSION: 1. There is new heterogeneous appearance of the pancreas which appears swollen in comparison to the prior exam from 04/03/2023. There is mild fat stranding surrounding the pancreas (asymmetrically involving the head/uncinate process), which is also new. Findings favor interstitial pancreatitis. No walled-off fluid collection. 2. There is new mild intrahepatic bile duct dilation. There is also increased dilation of the proximal extrahepatic bile duct measuring up to 1.3 cm however, it gradually tapers in the middle third portion and completely  obliterated in the distal third portion. No calcified gallstones. There is increased dilation of main pancreatic duct measuring up to 8 mm. However, no obstructing mass seen. If there is clinical concern for biliary obstruction, consider MRCP for further evaluation. 3. Multiple other nonacute observations, as described above. Aortic Atherosclerosis (ICD10-I70.0). Electronically Signed   By: Ree Molt M.D.   On: 03/29/2024 14:37    Assessment and Plan  Acute alcoholic pancreatitis Intra and extrahepatic biliary ductal dilation No fever, leukocytosis, or signs of sepsis.   Alk phos 161, transaminases and T. bili normal, lipase 101.  CT showing findings consistent with interstitial pancreatitis but no walled off fluid collection seen.  Also showing new mild intrahepatic bile duct dilation and there is also increased dilation of the proximal extrahepatic bile duct measuring up to 1.3 cm, however, it gradually tapers in the middle third portion and completely obliterated in the distal third portion.  No calcified gallstones seen.  There is increased dilation of the main pancreatic duct measuring up to 8 mm but no obstructing mass seen.   -IV fluid hydration -Pain management -Keep n.p.o. -MRCP ordered  Acute on chronic hyponatremia Sodium 124 and baseline appears to be 125-130.  Urine osmolarity 654, urine sodium 67, serum osmolarity 258.  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  Judicious fluid resuscitation for acute pancreatitis.  Monitor sodium level every 4 hours for now.  Check TSH and cortisol level.  Avoid rapid correction.    Hypertensive urgency Secondary to noncompliance.  He stopped taking his home antihypertensive medication 3 years ago and does not check his blood pressure at home.  SBP as high as 200s in the ED and was given IV hydralazine  10 mg.  Most recent SBP in the 170s.  Continue IV hydralazine  PRN SBP >160.  Alcohol use disorder No signs of withdrawal at this time.  Placed  on CIWA protocol; Ativan  as needed.  Thiamine , folate, and multivitamin.  Hypomagnesemia Replace and continue to monitor labs  GERD Continue Protonix .  DVT prophylaxis: SCDs until MRCP is done Code Status: Full Code (discussed with the patient) Family Communication: No family available at this time. Level of care: Progressive Care Unit Admission status: It is my clinical opinion that admission to INPATIENT is reasonable and necessary because of the expectation that this patient will require hospital care that crosses at least 2 midnights to treat this condition based on the medical complexity of the problems presented.  Given the aforementioned information, the predictability of an adverse outcome is felt to be significant.  Editha Ram MD [facility]  If 7PM-7AM, please contact night-coverage [email or web address]  03/29/2024, 9:53 PM

## 2024-03-29 NOTE — ED Notes (Signed)
 Patient given urinal and reminded we need urine specimen.

## 2024-03-29 NOTE — ED Notes (Signed)
 Patient reports that he has been drinking more beers than water  lately since he hasn't been going outside. Reports pain at 7/10.

## 2024-03-29 NOTE — ED Notes (Signed)
 This RN called main lab/chemistry to confirm status of in process BMP. Lab technician states the sample is still in the machine and should result soon. MD updated.

## 2024-03-29 NOTE — ED Provider Notes (Signed)
 Patient care signed out to follow-up repeat BMP and CT results.  CTs results independently reviewed showing mild inflammation of the pancreas.  Patient chronic alcohol use.  Repeat blood work delayed called the lab, nursing staff was able to assist with this process.  Sodium level returned similar to previous earlier in the day.  Patient overall feels well reassessment.  At this time patient is amenable to admission to the hospital for hyponatremia, pancreatitis and medical presentation.  Paged hospitalist for admission. Repeat IV ordered.  Discussed with hospitalist who agreed with admission.  Recommended hydralazine  for blood pressure management.  Labs Reviewed  LIPASE, BLOOD - Abnormal; Notable for the following components:      Result Value   Lipase 101 (*)    All other components within normal limits  COMPREHENSIVE METABOLIC PANEL WITH GFR - Abnormal; Notable for the following components:   Sodium 125 (*)    Chloride 89 (*)    Glucose, Bld 105 (*)    BUN 7 (*)    Alkaline Phosphatase 161 (*)    All other components within normal limits  CBC - Abnormal; Notable for the following components:   RBC 3.88 (*)    Hemoglobin 12.4 (*)    HCT 35.3 (*)    RDW 15.9 (*)    All other components within normal limits  URINALYSIS, ROUTINE W REFLEX MICROSCOPIC - Abnormal; Notable for the following components:   Specific Gravity, Urine >1.046 (*)    Ketones, ur 20 (*)    All other components within normal limits  OSMOLALITY - Abnormal; Notable for the following components:   Osmolality 258 (*)    All other components within normal limits  BASIC METABOLIC PANEL WITH GFR - Abnormal; Notable for the following components:   Sodium 124 (*)    Chloride 90 (*)    Creatinine, Ser 0.57 (*)    All other components within normal limits  OSMOLALITY, URINE  SODIUM, URINE, RANDOM  UREA  NITROGEN, URINE  MAGNESIUM    Alcohol-induced acute pancreatitis, unspecified complication status  Hyponatremia     Tonia Chew, MD 03/29/24 2012

## 2024-03-29 NOTE — Discharge Instructions (Signed)
 Your pancreatitis is likely because of your alcohol use.  Please decrease your alcohol use.  Please follow-up with your PCP to recheck your blood work in about 1-2 weeks. Your sodium today is 125. Please take the salt tablets with your meals for the next week.  Your blood pressure has been better controlled on the new medication- irbesartan . Please continue this and get your pressure rechecked at your follow up visit to see if your dose needs any readjusting once you have recovered from your pancreatitis fully                                      Outpatient Substance Abuse  Treatment- uninsured  Narcotics Anonymous 24-HOUR HELPLINE Pre-recorded for Meeting Schedules [facility] [phone]  [email or web address] ALCOHOLICS ANONYMOUS  [place] [facility]   Answering Service [phone] Please Note: All [place] Meetings are Non-smoking [email or web address]  Alcohol and Drug Services -  Insurance: Medicaid /State funding/private insurance Methadone, suboxone/Intensive outpatient  [place]   [phone] Fax: [phone] [address] [place], [place], [address] [place] [phone] Fax: [phone]    [address], [place], [place], [address] ([address] [place], [place], [place], [place], [place], [place], [place], [place]) [facility] [email or web address] Accepts State funding/Medicaid Transitional housing, Intensive Outpatient Treatment, Outpatient treatment, Veterans Services  Phone: [phone] Fax: [phone] Address: [address] [place] [address]   [facility] [employer] [employer] ([email or web address] Insurance: Medicaid Case Management, Administrator, arts, Medication Management, Outpatient Therapy, Psychosocial Rehabilitation, Substance Abuse Intensive Outpatient  Phone: [phone] Fax: [phone] [address], [place], [place], [address]  [facility] Medicaid, most private insurance  providers Types of Program: Individual/Group Therapy, Substance Abuse Treatment  Phone: [place] [phone] Fax: [phone] [address], [place], [place], [address]  [phone] [address] [place], [place], [address]  [facility]  Medicaid Types of Program: [facility]  Phone: [phone] Fax: [phone] [address] [place], [place], [place], [address] [facility] Medicaid/state funds Crisis line [phone] [facility] [phone] [place] [phone] [place] [phone]  [facility] [address];  [place], [place] [address] [phone]  Substance Abuse Intensive Outpatient Program [facility] [address] [place], [place] [address] [phone]- Substance abuse treatment  Successful Transitions  Insurance: [facility], [address], sliding scale Types of Program: substance abuse treatment, transportation assistance Phone: [phone] Fax: [phone] Address: [address], [place] [address] [facility] ([email or web address]) Insurance: UHC, [place], [place], [place] Program: addiction counseling, detoxification,  Phone: [phone]  Fax: [phone] Address: [address], [place] [address]  [facility] (statewide facilities/programs) [address] (Medicaid/state funds) [place], [place] [address]                      [email or web address] [phone] Daniel mcalpine- [phone] [place]- [phone] [facility] [place] (2 Locations) (Medicaid/state funds) --[address]  walk in 8:30-12 and 1-2:30 [place], [id number]   [facility]- [phone] --[address] [place], [place] [address]  [phone] walk in 8:30-12 and 2-3:30  [facility] state funds/medicaid [address] [place], [place] [address] [phone] [facility] (Suboxone clinic)  Medicaid/state funds  [address] [facility]  [place], [place] [address] [phone]   [facility]  [address] [place], [place] [address]  [phone] (24 hours) [place]- [address] [place], [place] [address]  [phone] (24 hours) [place]- [address] [place] [phone] [place]- [address] [place] [phone] [place] [address] [place]ita [place] [phone] [facility]- Medicaid and state funds  [place]- [address] [place], [place] [address] [phone] (24 hours) [place] [address] [place], [place] [address] [phone] [facility] [address] [place], [place] [address] [phone] (24 hours) [place] [address] [place], [place]  [address] [phone] [place]- [address] [place] [phone]    Pancreatitis Nutrition Therapy  The pancreas helps your body digest and absorb nutrients in food. Pancreatitis prevents the body from digesting food well, especially if the food is high in fat.  This nutrition therapy limits the fat in your diet while providing nutrients you need. Your goal is to eat as near to a normal diet as possible without experiencing gastrointestinal (GI) symptoms. These symptoms include stomach pain, bloating, weight loss or difficulty maintaining weight, vomiting, burping, loose stools, and steatorrhea. The effects of pancreatitis are different for all individuals, so it is important to work with your registered dietitian nutritionist (RDN) to determine which foods trigger your GI symptoms. Your RDN can also help you figure out your tolerance to fat in foods and how to manage your symptoms with your diet.  Tips You may need to take pancreatic enzymes if you have frequent, loose stools after mealtimes. Individuals who take pancreatic enzymes will need to take the prescribed dosage at the start of a meal or snack. Individuals who are prescribed a low-fat diet will need to limit fats and oils to no more than 6 teaspoons (30 grams)  daily. Up to 1 ounce of avocado can also be substituted for 1 teaspoon of fat. A low-fat diet may not be needed if you are taking pancreatic enzymes. Avoid drinking alcohol. Keep a bottle of water  with you at all times to stay hydrated and ensure you are getting in enough fluid each day. The general recommendation is to drink 8 cups (64 ounces) of fluids daily. Eat small, frequent meals (4-6) throughout the day to help you recover from pancreatitis or to maintain your normal body weight. Eat more whole fruits and vegetables rather than drinking fruit and vegetable juices.  Fiber is found in whole grain foods and slows digestion. You may need to choose whole grain foods less often if you feel full quickly after eating. Ask your RDN for recommendations on managing your diet if you also have other conditions, such as diabetes mellitus. Your RDN might recommend a vitamin and mineral supplement or fat-soluble vitamin supplements if you require higher amounts of these nutrients.  Foods Recommended and Foods Not Recommended The following list of foods may be helpful if you need to limit your fat intake. Food Group Foods Recommended Foods Not Recommended  Grains Breads: Bagels, buns, English muffins Hot/cold cereals Couscous Low-fat crackers Pancakes Pasta Popcorn, air popped Rice Corn or flour tortilla Products made with added fat (such as biscuits, waffles, and regular crackers) High-fat bakery products such as doughnuts, biscuits, croissants, Danish pastries, pies, cookies Snacks made with partially hydrogenated oils including chips, cheese puffs, snack mixes, regular crackers, butter-flavored popcorn  Protein Foods Lean cuts of poultry (without skin) such as chicken or [place] Low-fat hamburger (for example, 7% fat) Lean cuts of fish (white fish) Canned tuna in water  Egg whites or egg substitute Lean  deli meats such as [place], chicken, lean beef Non-animal protein sources (tofu, legumes,  beans, lentils) Smooth nut butters     Higher-fat cuts of meats such as ribs, T-bone steak, regular hamburger (15% to 20% fat) Full-fat processed meats (hot dogs, bologna, salami, sausage, bacon, etc) Red meats Organ meats (liver, brains, sweetbreads) Poultry with skin Fried meat, poultry, tofu, and fish Whole eggs and egg yolks Full-fat refried beans Tree nuts and peanuts  Dairy and Dairy Alternatives 1% or fat-free dairy (milk, yogurt, cheese, cottage cheese, sour cream) Frozen yogurt Fortified non-dairy milk (almond, rice, soy, etc.) Creamy/cheesy sauces Cream Whole-fat or reduced-fat (2%) dairy (milk, yogurt, ice cream, cheese) Milkshakes Half-and-half Cream cheese Sour cream Coconut milk  Vegetables All fresh, frozen, or canned vegetables Fried or stir-fried vegetables Vegetables prepared with butter, cheese, or cream sauce  Fruit All fresh, frozen, or canned fruit Fried fruits Fruit served with butter or cream  Fats and Oils All vegetable oils   Butter, stick margarine, shortening, partially hydrogenated oils, tropical oils (coconut, palm, and palm kernel oils)   Pancreatitis Sample 1-Day Menu View Nutrient Info Breakfast 2 egg whites, cooked 1 whole wheat bagel 1 tablespoon low-fat cream cheese 1 cup fat-free milk  cup blueberries  Lunch 2 slices bread 2 ounces [place] breast 2 leaves lettuce 2 slices tomato 1 teaspoon mustard 2 teaspoons nonfat mayonnaise 1 cup carrots  cup pineapple 1 cup fat-free milk  Evening Meal 3 ounces tilapia, baked  cup cooked rice  cup zucchini 1 cup lettuce for salad 2 teaspoons fat-free [place] dressing, for salad 1 dinner roll 1 teaspoon margarine  Evening Snack  cup low-fat yogurt 1 cup strawberries 1 ounce pretzels  Daily Sum Nutrient Unit Value  Macronutrients  Energy kcal 1560  Energy kJ 6541  Protein g 103  Total lipid (fat) g 23  Carbohydrate, by difference g 241  Fiber, total dietary g 23  Sugars, total g  104  Minerals  Calcium, Ca mg 1159  Iron, Fe mg 12  Sodium, Na mg 2034  Vitamins  Vitamin C, total ascorbic acid mg 158  Vitamin A, IU IU 28445  Vitamin D IU 457  Lipids  Fatty acids, total saturated g 6  Fatty acids, total monounsaturated g 6  Fatty acids, total polyunsaturated g 8  Cholesterol mg 123

## 2024-03-29 NOTE — ED Triage Notes (Signed)
 Pt c/o abd pain x 4 days, loss of appetite; denies N/V, fevers, denies urinary symptoms; pt gives verbal consent for MSE

## 2024-03-29 NOTE — ED Provider Notes (Signed)
 [place] EMERGENCY DEPARTMENT AT [facility] Provider Note   CSN: [id number] Arrival date & time: 03/29/24  1112     Patient presents with: Abdominal Pain   Logan Chambers is a 73 y.o. male.    Abdominal Pain     Patient presents with abdominal pain.  Epigastric abdominal pain.  No nausea vomiting.  Increasing pain and unable to tolerate p.o. because of pain.  Patient states he drinks about 2 40s of beer daily.  Last drink beer on Wednesday.  Symptoms started on Thursday.  No melena.  No hematemesis.  No fever no chills.  Just endorses extensive pain.  No chest pain or shortness of breath.   Previous medical history reviewed : Patient last admitted in 2023.  Was admitted in setting of abdominal pain.  Likely alcohol induced pancreatitis.   Prior to Admission medications   Medication Sig Start Date End Date Taking? Authorizing Provider  aspirin EC 81 MG tablet 1 tablet Orally Once a day 01/22/24   [provider]  latanoprost  (XALATAN ) 0.005 % ophthalmic solution Place 1 drop into both eyes at bedtime. 11/04/23   [provider]  pantoprazole  (PROTONIX ) 40 MG tablet Take 1 tablet (40 mg total) by mouth daily. 03/07/22 03/08/24  Tobie Yetta HERO, MD  tamsulosin  (FLOMAX ) 0.4 MG CAPS capsule Take 1 capsule (0.4 mg total) by mouth daily. 12/26/23   Cam Morene ORN, MD  traMADol  (ULTRAM ) 50 MG tablet Take 1-2 tablets (50-100 mg total) by mouth every 6 (six) hours as needed for moderate pain (pain score 4-6). 12/26/23   Cam Morene ORN, MD  vitamin B-12 (CYANOCOBALAMIN) 500 MCG tablet Take 500 mcg by mouth daily.    [provider]    Allergies: Lisinopril    Review of Systems  Gastrointestinal:  Positive for abdominal pain.    Updated Vital Signs BP (!) 197/93   Pulse 65   Temp (!) 97.5 F (36.4 C) (Oral)   Resp 17   SpO2 100%   Physical Exam Vitals and nursing note reviewed.  Constitutional:      General: He is not in acute  distress.    Appearance: He is well-developed.  HENT:     Head: Normocephalic and atraumatic.  Eyes:     Conjunctiva/sclera: Conjunctivae normal.  Cardiovascular:     Rate and Rhythm: Normal rate and regular rhythm.     Heart sounds: No murmur heard. Pulmonary:     Effort: Pulmonary effort is normal. No respiratory distress.     Breath sounds: Normal breath sounds.  Abdominal:     Palpations: Abdomen is soft.     Tenderness: There is no abdominal tenderness.   Musculoskeletal:        General: No swelling.     Cervical back: Neck supple.  Skin:    General: Skin is warm and dry.     Capillary Refill: Capillary refill takes less than 2 seconds.  Neurological:     Mental Status: He is alert.  Psychiatric:        Mood and Affect: Mood normal.     (all labs ordered are listed, but only abnormal results are displayed) Labs Reviewed  LIPASE, BLOOD - Abnormal; Notable for the following components:      Result Value   Lipase 101 (*)    All other components within normal limits  COMPREHENSIVE METABOLIC PANEL WITH GFR - Abnormal; Notable for the following components:   Sodium 125 (*)    Chloride 89 (*)  Glucose, Bld 105 (*)    BUN 7 (*)    Alkaline Phosphatase 161 (*)    All other components within normal limits  CBC - Abnormal; Notable for the following components:   RBC 3.88 (*)    Hemoglobin 12.4 (*)    HCT 35.3 (*)    RDW 15.9 (*)    All other components within normal limits  URINALYSIS, ROUTINE W REFLEX MICROSCOPIC  OSMOLALITY  OSMOLALITY, URINE  UREA  NITROGEN, URINE  SODIUM, URINE, RANDOM  BASIC METABOLIC PANEL WITH GFR    EKG: None  Radiology: CT ABDOMEN PELVIS W CONTRAST Result Date: 03/29/2024 CLINICAL DATA:  epigastric pain. elevated lipase. Eval for pancreatitis or other pathology. EXAM: CT ABDOMEN AND PELVIS WITH CONTRAST TECHNIQUE: Multidetector CT imaging of the abdomen and pelvis was performed using the standard protocol following bolus administration  of intravenous contrast. RADIATION DOSE REDUCTION: This exam was performed according to the departmental dose-optimization program which includes automated exposure control, adjustment of the mA and/or kV according to patient size and/or use of iterative reconstruction technique. CONTRAST:  75mL OMNIPAQUE  IOHEXOL  350 MG/ML SOLN COMPARISON:  CT scan abdomen from 04/03/2023. FINDINGS: Lower chest: There are emphysematous changes in the visualized lung bases. The lung bases are otherwise clear. No pleural effusion. The heart is normal in size. No pericardial effusion. Hepatobiliary: The liver is normal in size. Non-cirrhotic configuration. No suspicious mass. Mild intrahepatic bile duct dilation, increased since the prior study. There is mildly dilated proximal extrahepatic bile duct measuring up to 1.3 cm however, it gradually tapers in the middle third portion and completely obliterated in the distal third portion. No calcified gallstones. Normal gallbladder wall thickness. No pericholecystic inflammatory changes. Pancreas: There is new heterogeneous appearance of the pancreas which appears swollen in comparison to the prior exam from 04/03/2023. There is mild fat stranding surrounding the pancreas (asymmetrically involving the head/uncinate process), which is also new. Findings favor interstitial pancreatitis. Redemonstration of dilation of main pancreatic duct measuring up to 8 mm in the pancreatic neck region, increased since the prior study. However, no discrete obstructing mass seen. Spleen: Within normal limits. No focal lesion. Adrenals/Urinary Tract: Adrenal glands are unremarkable. No suspicious renal mass. No hydronephrosis. No renal or ureteric calculi. Urinary bladder is under distended, precluding optimal assessment. However, no large mass or stones identified. No perivesical fat stranding. Stomach/Bowel: No disproportionate dilation of the small or large bowel loops. No evidence of abnormal bowel wall  thickening or inflammatory changes. The appendix was not visualized; however there is no acute inflammatory process in the right lower quadrant. Vascular/Lymphatic: There is trace amount of ascites in the dependent pelvis. No walled-off abscess. No pneumoperitoneum. No abdominal or pelvic lymphadenopathy, by size criteria. No aneurysmal dilation of the major abdominal arteries. There are marked peripheral atherosclerotic vascular calcifications of the aorta and its major branches. Reproductive: Normal-sized prostate gland. Multiple prostatic radiation seeds noted. Symmetric bilateral seminal vesicles. Other: The visualized soft tissues and abdominal wall are unremarkable. Musculoskeletal: No suspicious osseous lesions. There are mild multilevel degenerative changes in the visualized spine. IMPRESSION: 1. There is new heterogeneous appearance of the pancreas which appears swollen in comparison to the prior exam from 04/03/2023. There is mild fat stranding surrounding the pancreas (asymmetrically involving the head/uncinate process), which is also new. Findings favor interstitial pancreatitis. No walled-off fluid collection. 2. There is new mild intrahepatic bile duct dilation. There is also increased dilation of the proximal extrahepatic bile duct measuring up to 1.3 cm however, it gradually  tapers in the middle third portion and completely obliterated in the distal third portion. No calcified gallstones. There is increased dilation of main pancreatic duct measuring up to 8 mm. However, no obstructing mass seen. If there is clinical concern for biliary obstruction, consider MRCP for further evaluation. 3. Multiple other nonacute observations, as described above. Aortic Atherosclerosis (ICD10-I70.0). Electronically Signed   By: Ree Molt M.D.   On: 03/29/2024 14:37     Procedures   Medications Ordered in the ED  sodium chloride  0.9 % bolus 500 mL (500 mLs Intravenous New Bag/Given 03/29/24 1358)  iohexol   (OMNIPAQUE ) 350 MG/ML injection 75 mL (75 mLs Intravenous Contrast Given 03/29/24 1423)                                    Medical Decision Making Amount and/or Complexity of Data Reviewed Labs: ordered. Radiology: ordered.  Risk Prescription drug management.     Patient presents with abdominal pain.  Epigastric abdominal pain.  No nausea vomiting.  Increasing pain and unable to tolerate p.o. because of pain.  Patient states he drinks about 2 40s of beer daily.  Last drink beer on Wednesday.  Symptoms started on Thursday.  No melena.  No hematemesis.  No fever no chills.  Just endorses extensive pain.  No chest pain or shortness of breath.   Previous medical history reviewed : Patient last admitted in 2023.  Was admitted in setting of abdominal pain.  Likely alcohol induced pancreatitis.   On exam, patient Impeklo stable.  ANO x 3 GCS 15.   Pain to palpation epigastric region.  In terms of laboratory workup.  Sodium level 125.  Likely beer potomania.  Did give patient 5 cc of fluid in the setting of his abdominal pain around the same time his sodium came back.  Will repeat BMP to make sure it is uptrending in the right direction.   Obtain CT scan of the abdomen.  Pancreatitis.  No acute complications.  No leukocytosis.  No anion gap.  No evidence of any Abscess.   Patient is asking if he can go home.  Will try and p.o. challenge the patient.  Recommended follow-up with gastroenterology outpatient.  If Patient unable to tolerate p.o. then will be admitted.   Signed out stable condition.       Final diagnoses:  Alcohol-induced acute pancreatitis, unspecified complication status  Hyponatremia    ED Discharge Orders     None          Simon Lavonia SAILOR, MD 03/29/24 [phone]

## 2024-03-30 ENCOUNTER — Inpatient Hospital Stay (HOSPITAL_COMMUNITY)

## 2024-03-30 DIAGNOSIS — K852 Alcohol induced acute pancreatitis without necrosis or infection: Secondary | ICD-10-CM | POA: Diagnosis not present

## 2024-03-30 DIAGNOSIS — I160 Hypertensive urgency: Secondary | ICD-10-CM | POA: Diagnosis not present

## 2024-03-30 DIAGNOSIS — E871 Hypo-osmolality and hyponatremia: Secondary | ICD-10-CM

## 2024-03-30 DIAGNOSIS — F1090 Alcohol use, unspecified, uncomplicated: Secondary | ICD-10-CM

## 2024-03-30 LAB — CORTISOL-AM, BLOOD: Cortisol - AM: 10.3 ug/dL (ref 6.7–22.6)

## 2024-03-30 LAB — BASIC METABOLIC PANEL WITH GFR
Anion gap: 11 (ref 5–15)
Anion gap: 9 (ref 5–15)
Anion gap: 12 (ref 5–15)
BUN: 6 mg/dL — ABNORMAL LOW (ref 8–23)
BUN: 6 mg/dL — ABNORMAL LOW (ref 8–23)
BUN: 7 mg/dL — ABNORMAL LOW (ref 8–23)
CO2: 23 mmol/L (ref 22–32)
CO2: 23 mmol/L (ref 22–32)
CO2: 21 mmol/L — ABNORMAL LOW (ref 22–32)
Calcium: 8.8 mg/dL — ABNORMAL LOW (ref 8.9–10.3)
Calcium: 8.8 mg/dL — ABNORMAL LOW (ref 8.9–10.3)
Calcium: 8.8 mg/dL — ABNORMAL LOW (ref 8.9–10.3)
Chloride: 91 mmol/L — ABNORMAL LOW (ref 98–111)
Chloride: 94 mmol/L — ABNORMAL LOW (ref 98–111)
Chloride: 91 mmol/L — ABNORMAL LOW (ref 98–111)
Creatinine, Ser: 0.57 mg/dL — ABNORMAL LOW (ref 0.61–1.24)
Creatinine, Ser: 0.56 mg/dL — ABNORMAL LOW (ref 0.61–1.24)
Creatinine, Ser: 0.49 mg/dL — ABNORMAL LOW (ref 0.61–1.24)
GFR, Estimated: >60 mL/min (ref >60)
GFR, Estimated: >60 mL/min (ref >60)
GFR, Estimated: >60 mL/min (ref >60)
Glucose, Bld: 89 mg/dL (ref 70–99)
Glucose, Bld: 86 mg/dL (ref 70–99)
Glucose, Bld: 83 mg/dL (ref 70–99)
Potassium: 4.1 mmol/L (ref 3.5–5.1)
Potassium: 3.9 mmol/L (ref 3.5–5.1)
Potassium: 3.3 mmol/L — ABNORMAL LOW (ref 3.5–5.1)
Sodium: 125 mmol/L — ABNORMAL LOW (ref 135–145)
Sodium: 126 mmol/L — ABNORMAL LOW (ref 135–145)
Sodium: 124 mmol/L — ABNORMAL LOW (ref 135–145)

## 2024-03-30 LAB — CBC
HCT: 33.2 % — ABNORMAL LOW (ref 39.0–52.0)
Hemoglobin: 11.8 g/dL — ABNORMAL LOW (ref 13.0–17.0)
MCH: 32 pg (ref 26.0–34.0)
MCHC: 35.5 g/dL (ref 30.0–36.0)
MCV: 90 fL (ref 80.0–100.0)
Platelets: 228 K/uL (ref 150–400)
RBC: 3.69 MIL/uL — ABNORMAL LOW (ref 4.22–5.81)
RDW: 15.6 % — ABNORMAL HIGH (ref 11.5–15.5)
WBC: 6 K/uL (ref 4.0–10.5)
nRBC: 0 % (ref 0.0–0.2)

## 2024-03-30 LAB — MAGNESIUM: Magnesium: 2.2 mg/dL (ref 1.7–2.4)

## 2024-03-30 LAB — UREA NITROGEN, URINE: Urea Nitrogen, Ur: 429 mg/dL

## 2024-03-30 LAB — TSH: TSH: 2.049 u[IU]/mL (ref 0.350–4.500)

## 2024-03-30 MED ORDER — GADOBUTROL 1 MMOL/ML IV SOLN
6.0000 mL | Freq: Once | INTRAVENOUS | Status: AC | PRN
  Administered 2024-03-30: 6 mL via INTRAVENOUS

## 2024-03-30 MED ORDER — LORAZEPAM 1 MG PO TABS: 1.0000 mg | ORAL_TABLET | ORAL | Status: DC | PRN

## 2024-03-30 MED ORDER — AMLODIPINE BESYLATE 5 MG PO TABS
5.0000 mg | ORAL_TABLET | Freq: Every day | ORAL | Status: DC
  Administered 2024-03-30 – 2024-04-01 (×3): 5 mg via ORAL
  Filled 2024-03-30 (×3): qty 1

## 2024-03-30 MED ORDER — BOOST / RESOURCE BREEZE PO LIQD CUSTOM
1.0000 | Freq: Three times a day (TID) | ORAL | Status: DC
  Administered 2024-03-30 – 2024-04-01 (×5): 1 via ORAL

## 2024-03-30 NOTE — ED Notes (Signed)
 [facility] called, will send a truck when they have one available

## 2024-03-30 NOTE — Progress Notes (Signed)
 Progress Note   Patient: Logan Chambers FMW:[id number] DOB: 12/26/1950 DOA: 03/29/2024     1 DOS: the patient was seen and examined on 03/30/2024   Brief hospital course: Logan Chambers is a 73 y.o. male with medical history significant of alcohol abuse, alcoholic pancreatitis, pancreatic cystic lesion, hypertension, prostate cancer status post radioactive seed implant, tobacco abuse, GERD, arthritis, chronic hyponatremia presenting with a chief complaint of epigastric abdominal pain.  Labs showed Na 125, lipase 101, CT showing findings consistent with interstitial pancreatitis but no walled off fluid collection seen. Admitted to [facility] service for acute alcoholic pancreatitis management.  Assessment and Plan: Acute on chronic alcoholic pancreatitis- Continue NPO, IV hydration, pain control. Will start clears this afternoon and advance as tolerated. MRCP reviewed shows common bile duct stricture vs mass effect from pancreatitis.  Acute on Chronic Hyponatremia- Na at baseline 125-130 possibly in the setting of alcohol use. Continue to trend.  Hypertensive urgency- BP elevated. Will start norvasc  5mg  daily, uptitrate as needed. Allergy to Lisinopril noted. IV hydralazine  as needed.  Alcohol use disorder Last alcohol 1 week ago. discontinued CIWA protocol. Use Ativan  as needed.   Continue Thiamine , folate, and multivitamin.   Hypomagnesemia Improved with replacement. Continue to monitor daily electrolytes.  GERD Continue Protonix .  At risk for malnutrition BMI 17 Dietician consulted.     Out of bed to chair. Incentive spirometry. Nursing supportive care. Fall, aspiration precautions. Diet:  Diet Orders (From admission, onward)     Start     Ordered   03/29/24 2251  Diet NPO time specified  Diet effective now        03/29/24 2254           DVT prophylaxis: SCDs Start: 03/29/24 2250  Level of care: Progressive   Code Status: Full Code  Subjective: Patient is seen  and examined today morning. States his abdominal pian better. No nausea, vomiting. Wishes to eat.  Physical Exam: Vitals:   03/30/24 0500 03/30/24 0556 03/30/24 0600 03/30/24 0700  BP: (!) 180/82  (!) 186/81 (!) 145/78  Pulse: 71  80 65  Resp: 15  14 12   Temp:  98.9 F (37.2 C)    TempSrc:  Oral    SpO2: 100%  100% 100%  Weight:      Height:        General - Elderly African American thin built male, no apparent distress HEENT - PERRLA, EOMI, atraumatic head, non tender sinuses. Lung - Clear, no rales, rhonchi, wheezes. Heart - S1, S2 heard, no murmurs, rubs, no pedal edema. Abdomen - Soft, non tender, bowel sounds good Neuro - Alert, awake and oriented x 3, non focal exam. Skin - Warm and dry.  Data Reviewed:      Latest Ref Rng & Units 03/30/2024    5:42 AM 03/29/2024   11:28 AM 12/17/2023   10:14 AM  CBC  WBC 4.0 - 10.5 K/uL 6.0  5.4  4.3   Hemoglobin 13.0 - 17.0 g/dL 88.1  87.5  87.6   Hematocrit 39.0 - 52.0 % 33.2  35.3  35.4   Platelets 150 - 400 K/uL 228  245  246       Latest Ref Rng & Units 03/30/2024    5:42 AM 03/29/2024    3:56 PM 03/29/2024   11:28 AM  BMP  Glucose 70 - 99 mg/dL 89  78  894   BUN 8 - 23 mg/dL 6  8  7    Creatinine 0.61 - 1.24  mg/dL 9.42  9.42  9.29   Sodium 135 - 145 mmol/L 125  124  125   Potassium 3.5 - 5.1 mmol/L 4.1  4.2  4.2   Chloride 98 - 111 mmol/L 91  90  89   CO2 22 - 32 mmol/L 23  22  23    Calcium 8.9 - 10.3 mg/dL 8.8  8.9  9.5    MR ABDOMEN MRCP W WO CONTAST Result Date: 03/30/2024 CLINICAL DATA:  Biliary dilatation and pancreatitis. EXAM: MRI ABDOMEN WITHOUT AND WITH CONTRAST (INCLUDING MRCP) TECHNIQUE: Multiplanar multisequence MR imaging of the abdomen was performed both before and after the administration of intravenous contrast. Heavily T2-weighted images of the biliary and pancreatic ducts were obtained, and three-dimensional MRCP images were rendered by post processing. CONTRAST:  6mL GADAVIST  GADOBUTROL  1 MMOL/ML IV SOLN  COMPARISON:  Abdomen and pelvis CT 03/29/2024. FINDINGS: This exam is markedly limited by poor signal to noise ratio. Imaging of the pancreas and kidneys is nondiagnostic on some pulse sequences. Lower chest: No acute findings. Hepatobiliary: No suspicious focal abnormality within the liver parenchyma. Intrahepatic biliary duct dilatation evident. Common duct measures up to 7 mm diameter in the hepatoduodenal ligament. Common bile duct is 11 mm as it enters the head of the pancreas and then abruptly but smoothly tapers or beaks in the pancreatic head (see coronal T2 haste image 12 of series 7). No obstructing stone evident. Gallbladder is distended without discernible stone disease. No gallbladder wall thickening. Trace pericholecystic fluid towards the fundus. Pancreas: Pancreas is diffusely heterogeneous with diffuse dilatation of the main pancreatic duct and side branches throughout the pancreatic parenchyma. As noted above, evaluation of pancreas is nondiagnostic on some pulse sequences. Pancreatic head appears enlarged and heterogeneous. Spleen: Unremarkable on T2 imaging. Postcontrast imaging is nondiagnostic. Adrenals/Urinary Tract: No adrenal nodule or mass on T2 imaging. No gross renal mass lesion evident on T2 imaging. No hydronephrosis. Postcontrast imaging of the kidneys is nondiagnostic. Stomach/Bowel: Stomach is nondistended. Colon is diffusely distended with gas and stool. Vascular/Lymphatic: No abdominal aortic aneurysm. No discernible lymphadenopathy in the abdomen. Other:  No substantial intraperitoneal free fluid. Musculoskeletal: Imaging of the visualized thoracolumbar spine on postcontrast imaging is nondiagnostic. IMPRESSION: 1. Markedly limited study due to poor signal to noise ratio multiple pulse sequences including post-contrast imaging. Imaging of the pancreas and kidneys is nondiagnostic on some pulse sequences. 2. Intrahepatic and extrahepatic biliary duct dilatation with abrupt but  smooth tapering of the common bile duct in the pancreatic head. No obstructing stone evident. No definite discrete mass lesion in the head of the pancreas although assessment of the pancreas is nondiagnostic on most of the pulse sequences. Findings could be related to common bile duct stricture or mass-effect from the acute on chronic pancreatic changes described on previous studies. Pancreatic head mass lesion cannot be excluded on this study. ERCP/endoscopic ultrasound may prove helpful to further evaluate. 3. Diffuse dilatation of the main pancreatic duct and side branches throughout the pancreatic parenchyma. Pancreatic head appears enlarged and heterogeneous. Imaging features are compatible with acute on chronic pancreatitis. Follow-up recommended to ensure resolution. 4. Distended gallbladder without discernible stone disease. Trace pericholecystic fluid towards the fundus. 5. Colon is diffusely distended with gas and stool. Electronically Signed   By: Camellia Candle M.D.   On: 03/30/2024 05:36   CT ABDOMEN PELVIS W CONTRAST Result Date: 03/29/2024 CLINICAL DATA:  epigastric pain. elevated lipase. Eval for pancreatitis or other pathology. EXAM: CT ABDOMEN AND PELVIS WITH CONTRAST TECHNIQUE:  Multidetector CT imaging of the abdomen and pelvis was performed using the standard protocol following bolus administration of intravenous contrast. RADIATION DOSE REDUCTION: This exam was performed according to the departmental dose-optimization program which includes automated exposure control, adjustment of the mA and/or kV according to patient size and/or use of iterative reconstruction technique. CONTRAST:  75mL OMNIPAQUE  IOHEXOL  350 MG/ML SOLN COMPARISON:  CT scan abdomen from 04/03/2023. FINDINGS: Lower chest: There are emphysematous changes in the visualized lung bases. The lung bases are otherwise clear. No pleural effusion. The heart is normal in size. No pericardial effusion. Hepatobiliary: The liver is normal  in size. Non-cirrhotic configuration. No suspicious mass. Mild intrahepatic bile duct dilation, increased since the prior study. There is mildly dilated proximal extrahepatic bile duct measuring up to 1.3 cm however, it gradually tapers in the middle third portion and completely obliterated in the distal third portion. No calcified gallstones. Normal gallbladder wall thickness. No pericholecystic inflammatory changes. Pancreas: There is new heterogeneous appearance of the pancreas which appears swollen in comparison to the prior exam from 04/03/2023. There is mild fat stranding surrounding the pancreas (asymmetrically involving the head/uncinate process), which is also new. Findings favor interstitial pancreatitis. Redemonstration of dilation of main pancreatic duct measuring up to 8 mm in the pancreatic neck region, increased since the prior study. However, no discrete obstructing mass seen. Spleen: Within normal limits. No focal lesion. Adrenals/Urinary Tract: Adrenal glands are unremarkable. No suspicious renal mass. No hydronephrosis. No renal or ureteric calculi. Urinary bladder is under distended, precluding optimal assessment. However, no large mass or stones identified. No perivesical fat stranding. Stomach/Bowel: No disproportionate dilation of the small or large bowel loops. No evidence of abnormal bowel wall thickening or inflammatory changes. The appendix was not visualized; however there is no acute inflammatory process in the right lower quadrant. Vascular/Lymphatic: There is trace amount of ascites in the dependent pelvis. No walled-off abscess. No pneumoperitoneum. No abdominal or pelvic lymphadenopathy, by size criteria. No aneurysmal dilation of the major abdominal arteries. There are marked peripheral atherosclerotic vascular calcifications of the aorta and its major branches. Reproductive: Normal-sized prostate gland. Multiple prostatic radiation seeds noted. Symmetric bilateral seminal vesicles.  Other: The visualized soft tissues and abdominal wall are unremarkable. Musculoskeletal: No suspicious osseous lesions. There are mild multilevel degenerative changes in the visualized spine. IMPRESSION: 1. There is new heterogeneous appearance of the pancreas which appears swollen in comparison to the prior exam from 04/03/2023. There is mild fat stranding surrounding the pancreas (asymmetrically involving the head/uncinate process), which is also new. Findings favor interstitial pancreatitis. No walled-off fluid collection. 2. There is new mild intrahepatic bile duct dilation. There is also increased dilation of the proximal extrahepatic bile duct measuring up to 1.3 cm however, it gradually tapers in the middle third portion and completely obliterated in the distal third portion. No calcified gallstones. There is increased dilation of main pancreatic duct measuring up to 8 mm. However, no obstructing mass seen. If there is clinical concern for biliary obstruction, consider MRCP for further evaluation. 3. Multiple other nonacute observations, as described above. Aortic Atherosclerosis (ICD10-I70.0). Electronically Signed   By: Ree Molt M.D.   On: 03/29/2024 14:37    Family Communication: Discussed with patient, understand and agree. All questions answered.  Disposition: Status is: Inpatient Remains inpatient appropriate because: IV fluids, advance diet.  Planned Discharge Destination: Home     Time spent: 40 minutes  Author: Concepcion Riser, MD 03/30/2024 8:38 AM Secure chat 7am to 7pm For on  call review [email or web address].

## 2024-03-30 NOTE — Progress Notes (Signed)
 CSW added substance abuse resources to patient's AVS.  Edwin Dada, MSW, LCSW Transitions of Care  Clinical Social Worker II [phone]

## 2024-03-30 NOTE — Plan of Care (Signed)

## 2024-03-31 DIAGNOSIS — K8500 Idiopathic acute pancreatitis without necrosis or infection: Secondary | ICD-10-CM

## 2024-03-31 DIAGNOSIS — E43 Unspecified severe protein-calorie malnutrition: Secondary | ICD-10-CM | POA: Insufficient documentation

## 2024-03-31 LAB — HEPATIC FUNCTION PANEL
ALT: 11 U/L (ref 0–44)
AST: 22 U/L (ref 15–41)
Albumin: 3.2 g/dL — ABNORMAL LOW (ref 3.5–5.0)
Alkaline Phosphatase: 147 U/L — ABNORMAL HIGH (ref 38–126)
Bilirubin, Direct: 0.3 mg/dL — ABNORMAL HIGH (ref 0.0–0.2)
Indirect Bilirubin: 0.7 mg/dL (ref 0.3–0.9)
Total Bilirubin: 1 mg/dL (ref 0.0–1.2)
Total Protein: 7.4 g/dL (ref 6.5–8.1)

## 2024-03-31 MED ORDER — IRBESARTAN 75 MG PO TABS
75.0000 mg | ORAL_TABLET | Freq: Every day | ORAL | Status: DC
  Administered 2024-03-31 – 2024-04-01 (×2): 75 mg via ORAL
  Filled 2024-03-31 (×2): qty 1

## 2024-03-31 MED ORDER — POTASSIUM CHLORIDE 20 MEQ PO PACK
40.0000 meq | PACK | Freq: Two times a day (BID) | ORAL | Status: AC
  Administered 2024-03-31 – 2024-04-01 (×3): 40 meq via ORAL
  Filled 2024-03-31 (×3): qty 2

## 2024-03-31 MED ORDER — POLYETHYLENE GLYCOL 3350 17 G PO PACK
17.0000 g | PACK | Freq: Two times a day (BID) | ORAL | Status: DC
  Administered 2024-03-31 (×2): 17 g via ORAL
  Filled 2024-03-31 (×3): qty 1

## 2024-03-31 MED ORDER — SODIUM CHLORIDE 1 G PO TABS
1.0000 g | ORAL_TABLET | Freq: Three times a day (TID) | ORAL | Status: DC
  Administered 2024-03-31 – 2024-04-01 (×3): 1 g via ORAL
  Filled 2024-03-31 (×3): qty 1

## 2024-03-31 NOTE — Progress Notes (Addendum)
 Progress Note Patient: Logan Chambers FMW:[id number] DOB: [birth year] DOA: 03/29/2024     2 DOS: the patient was seen and examined on 03/31/2024   Brief hospital course: Logan Chambers is a 73 y.o. male with medical history significant of alcohol abuse, alcoholic pancreatitis, pancreatic cystic lesion, hypertension, prostate cancer status post radioactive seed implant, tobacco abuse, GERD, arthritis, chronic hyponatremia presenting with a chief complaint of epigastric abdominal pain.  Labs showed Na 125, lipase 101, CT showing findings consistent with interstitial pancreatitis but no walled off fluid collection seen. Admitted to TR[facility] service for acute alcoholic pancreatitis management.  Assessment and Plan: Acute on chronic alcoholic pancreatitis- pain has been well tolerated on fluids so will advance diet as tolerated today. No BM since admission. MRCP reviewed shows common bile duct stricture vs mass effect from pancreatitis. - encourage PO fluid intake - analgesia PRN - bowel regimen - adding on hepatic panel- direct bilirubin mildly elevated to 0.3, alk phos 147  Acute on Chronic Hyponatremia- 124 overnight Na at baseline 125-130 possibly in the setting of alcohol use. Continue to trend. - start salt tablets, CMP am  Hypokalemia- monitor and replace PRN  Hypertensive urgency- poorly controlled BP - starting irbesartan , titrate up as needed - continue amlodipine   Alcohol use disorder Last alcohol 1 week ago. discontinued CIWA protocol. Use Ativan  as needed.   Continue Thiamine , folate, and multivitamin.   Hypomagnesemia Improved with replacement. Continue to monitor daily electrolytes.  GERD Continue Protonix .  At risk for malnutrition BMI 17 Dietician consulted.  Out of bed to chair. Incentive spirometry. Nursing supportive care. Fall, aspiration precautions. Diet:  Diet Orders (From admission, onward)     Start     Ordered   03/30/24 1000  Diet clear liquid Fluid  consistency: Thin  Diet effective 1000       Question:  Fluid consistency:  Answer:  Thin   03/30/24 0924           DVT prophylaxis: SCDs Start: 03/29/24 2250  Level of care: Telemetry   Code Status: Full Code  Subjective: Patient reports feeling better today. Has no nausea. Has not had BM in 2 days.   Physical Exam: Vitals:   03/30/24 1630 03/30/24 1752 03/30/24 2122 03/31/24 0206  BP: (!) 152/74 (!) 160/81 (!) 159/77 138/87  Pulse:  77 79 85  Resp:  16 18 18   Temp: 98.9 F (37.2 C) (!) 97.2 F (36.2 C) 98.3 F (36.8 C) 98.3 F (36.8 C)  TempSrc: Oral     SpO2:  97% 100% 100%  Weight:      Height:        General - Elderly, thin built male, no apparent distress HEENT - PERRLA, EOMI, atraumatic head, non tender sinuses. Lung - Clear, no rales, rhonchi, wheezes. Heart - S1, S2 heard, no murmurs, rubs, no pedal edema. Abdomen - Soft, non tender, bowel sounds good Neuro - Alert, awake and oriented x 3, non focal exam. Skin - Warm and dry.  Data Reviewed:    Latest Ref Rng & Units 03/30/2024    5:42 AM 03/29/2024   11:28 AM 12/17/2023   10:14 AM  CBC  WBC 4.0 - 10.5 K/uL 6.0  5.4  4.3   Hemoglobin 13.0 - 17.0 g/dL 88.1  87.5  87.6   Hematocrit 39.0 - 52.0 % 33.2  35.3  35.4   Platelets 150 - 400 K/uL 228  245  246       Latest Ref Rng & Units 03/30/2024  5:24 PM 03/30/2024    8:55 AM 03/30/2024    5:42 AM  BMP  Glucose 70 - 99 mg/dL 83  86  89   BUN 8 - 23 mg/dL 7  6  6    Creatinine 0.61 - 1.24 mg/dL 9.50  9.43  9.42   Sodium 135 - 145 mmol/L 124  126  125   Potassium 3.5 - 5.1 mmol/L 3.3  3.9  4.1   Chloride 98 - 111 mmol/L 91  94  91   CO2 22 - 32 mmol/L 21  23  23    Calcium 8.9 - 10.3 mg/dL 8.8  8.8  8.8    MR 3D Recon At Scanner Result Date: 03/30/2024 CLINICAL DATA:  Biliary dilatation and pancreatitis. EXAM: MRI ABDOMEN WITHOUT AND WITH CONTRAST (INCLUDING MRCP) TECHNIQUE: Multiplanar multisequence MR imaging of the abdomen was performed both before and  after the administration of intravenous contrast. Heavily T2-weighted images of the biliary and pancreatic ducts were obtained, and three-dimensional MRCP images were rendered by post processing. CONTRAST:  6mL GADAVIST  GADOBUTROL  1 MMOL/ML IV SOLN COMPARISON:  Abdomen and pelvis CT 03/29/2024. FINDINGS: This exam is markedly limited by poor signal to noise ratio. Imaging of the pancreas and kidneys is nondiagnostic on some pulse sequences. Lower chest: No acute findings. Hepatobiliary: No suspicious focal abnormality within the liver parenchyma. Intrahepatic biliary duct dilatation evident. Common duct measures up to 7 mm diameter in the hepatoduodenal ligament. Common bile duct is 11 mm as it enters the head of the pancreas and then abruptly but smoothly tapers or beaks in the pancreatic head (see coronal T2 haste image 12 of series 7). No obstructing stone evident. Gallbladder is distended without discernible stone disease. No gallbladder wall thickening. Trace pericholecystic fluid towards the fundus. Pancreas: Pancreas is diffusely heterogeneous with diffuse dilatation of the main pancreatic duct and side branches throughout the pancreatic parenchyma. As noted above, evaluation of pancreas is nondiagnostic on some pulse sequences. Pancreatic head appears enlarged and heterogeneous. Spleen: Unremarkable on T2 imaging. Postcontrast imaging is nondiagnostic. Adrenals/Urinary Tract: No adrenal nodule or mass on T2 imaging. No gross renal mass lesion evident on T2 imaging. No hydronephrosis. Postcontrast imaging of the kidneys is nondiagnostic. Stomach/Bowel: Stomach is nondistended. Colon is diffusely distended with gas and stool. Vascular/Lymphatic: No abdominal aortic aneurysm. No discernible lymphadenopathy in the abdomen. Other:  No substantial intraperitoneal free fluid. Musculoskeletal: Imaging of the visualized thoracolumbar spine on postcontrast imaging is nondiagnostic. IMPRESSION: 1. Markedly limited  study due to poor signal to noise ratio multiple pulse sequences including post-contrast imaging. Imaging of the pancreas and kidneys is nondiagnostic on some pulse sequences. 2. Intrahepatic and extrahepatic biliary duct dilatation with abrupt but smooth tapering of the common bile duct in the pancreatic head. No obstructing stone evident. No definite discrete mass lesion in the head of the pancreas although assessment of the pancreas is nondiagnostic on most of the pulse sequences. Findings could be related to common bile duct stricture or mass-effect from the acute on chronic pancreatic changes described on previous studies. Pancreatic head mass lesion cannot be excluded on this study. ERCP/endoscopic ultrasound may prove helpful to further evaluate. 3. Diffuse dilatation of the main pancreatic duct and side branches throughout the pancreatic parenchyma. Pancreatic head appears enlarged and heterogeneous. Imaging features are compatible with acute on chronic pancreatitis. Follow-up recommended to ensure resolution. 4. Distended gallbladder without discernible stone disease. Trace pericholecystic fluid towards the fundus. 5. Colon is diffusely distended with gas and stool. Electronically  Signed   By: Camellia Candle M.D.   On: 03/30/2024 07:10   MR ABDOMEN MRCP W WO CONTAST Result Date: 03/30/2024 CLINICAL DATA:  Biliary dilatation and pancreatitis. EXAM: MRI ABDOMEN WITHOUT AND WITH CONTRAST (INCLUDING MRCP) TECHNIQUE: Multiplanar multisequence MR imaging of the abdomen was performed both before and after the administration of intravenous contrast. Heavily T2-weighted images of the biliary and pancreatic ducts were obtained, and three-dimensional MRCP images were rendered by post processing. CONTRAST:  6mL GADAVIST  GADOBUTROL  1 MMOL/ML IV SOLN COMPARISON:  Abdomen and pelvis CT 03/29/2024. FINDINGS: This exam is markedly limited by poor signal to noise ratio. Imaging of the pancreas and kidneys is nondiagnostic  on some pulse sequences. Lower chest: No acute findings. Hepatobiliary: No suspicious focal abnormality within the liver parenchyma. Intrahepatic biliary duct dilatation evident. Common duct measures up to 7 mm diameter in the hepatoduodenal ligament. Common bile duct is 11 mm as it enters the head of the pancreas and then abruptly but smoothly tapers or beaks in the pancreatic head (see coronal T2 haste image 12 of series 7). No obstructing stone evident. Gallbladder is distended without discernible stone disease. No gallbladder wall thickening. Trace pericholecystic fluid towards the fundus. Pancreas: Pancreas is diffusely heterogeneous with diffuse dilatation of the main pancreatic duct and side branches throughout the pancreatic parenchyma. As noted above, evaluation of pancreas is nondiagnostic on some pulse sequences. Pancreatic head appears enlarged and heterogeneous. Spleen: Unremarkable on T2 imaging. Postcontrast imaging is nondiagnostic. Adrenals/Urinary Tract: No adrenal nodule or mass on T2 imaging. No gross renal mass lesion evident on T2 imaging. No hydronephrosis. Postcontrast imaging of the kidneys is nondiagnostic. Stomach/Bowel: Stomach is nondistended. Colon is diffusely distended with gas and stool. Vascular/Lymphatic: No abdominal aortic aneurysm. No discernible lymphadenopathy in the abdomen. Other:  No substantial intraperitoneal free fluid. Musculoskeletal: Imaging of the visualized thoracolumbar spine on postcontrast imaging is nondiagnostic. IMPRESSION: 1. Markedly limited study due to poor signal to noise ratio multiple pulse sequences including post-contrast imaging. Imaging of the pancreas and kidneys is nondiagnostic on some pulse sequences. 2. Intrahepatic and extrahepatic biliary duct dilatation with abrupt but smooth tapering of the common bile duct in the pancreatic head. No obstructing stone evident. No definite discrete mass lesion in the head of the pancreas although  assessment of the pancreas is nondiagnostic on most of the pulse sequences. Findings could be related to common bile duct stricture or mass-effect from the acute on chronic pancreatic changes described on previous studies. Pancreatic head mass lesion cannot be excluded on this study. ERCP/endoscopic ultrasound may prove helpful to further evaluate. 3. Diffuse dilatation of the main pancreatic duct and side branches throughout the pancreatic parenchyma. Pancreatic head appears enlarged and heterogeneous. Imaging features are compatible with acute on chronic pancreatitis. Follow-up recommended to ensure resolution. 4. Distended gallbladder without discernible stone disease. Trace pericholecystic fluid towards the fundus. 5. Colon is diffusely distended with gas and stool. Electronically Signed   By: Camellia Candle M.D.   On: 03/30/2024 05:36   CT ABDOMEN PELVIS W CONTRAST Result Date: 03/29/2024 CLINICAL DATA:  epigastric pain. elevated lipase. Eval for pancreatitis or other pathology. EXAM: CT ABDOMEN AND PELVIS WITH CONTRAST TECHNIQUE: Multidetector CT imaging of the abdomen and pelvis was performed using the standard protocol following bolus administration of intravenous contrast. RADIATION DOSE REDUCTION: This exam was performed according to the departmental dose-optimization program which includes automated exposure control, adjustment of the mA and/or kV according to patient size and/or use of iterative reconstruction technique.  CONTRAST:  75mL OMNIPAQUE  IOHEXOL  350 MG/ML SOLN COMPARISON:  CT scan abdomen from 04/03/2023. FINDINGS: Lower chest: There are emphysematous changes in the visualized lung bases. The lung bases are otherwise clear. No pleural effusion. The heart is normal in size. No pericardial effusion. Hepatobiliary: The liver is normal in size. Non-cirrhotic configuration. No suspicious mass. Mild intrahepatic bile duct dilation, increased since the prior study. There is mildly dilated proximal  extrahepatic bile duct measuring up to 1.3 cm however, it gradually tapers in the middle third portion and completely obliterated in the distal third portion. No calcified gallstones. Normal gallbladder wall thickness. No pericholecystic inflammatory changes. Pancreas: There is new heterogeneous appearance of the pancreas which appears swollen in comparison to the prior exam from 04/03/2023. There is mild fat stranding surrounding the pancreas (asymmetrically involving the head/uncinate process), which is also new. Findings favor interstitial pancreatitis. Redemonstration of dilation of main pancreatic duct measuring up to 8 mm in the pancreatic neck region, increased since the prior study. However, no discrete obstructing mass seen. Spleen: Within normal limits. No focal lesion. Adrenals/Urinary Tract: Adrenal glands are unremarkable. No suspicious renal mass. No hydronephrosis. No renal or ureteric calculi. Urinary bladder is under distended, precluding optimal assessment. However, no large mass or stones identified. No perivesical fat stranding. Stomach/Bowel: No disproportionate dilation of the small or large bowel loops. No evidence of abnormal bowel wall thickening or inflammatory changes. The appendix was not visualized; however there is no acute inflammatory process in the right lower quadrant. Vascular/Lymphatic: There is trace amount of ascites in the dependent pelvis. No walled-off abscess. No pneumoperitoneum. No abdominal or pelvic lymphadenopathy, by size criteria. No aneurysmal dilation of the major abdominal arteries. There are marked peripheral atherosclerotic vascular calcifications of the aorta and its major branches. Reproductive: Normal-sized prostate gland. Multiple prostatic radiation seeds noted. Symmetric bilateral seminal vesicles. Other: The visualized soft tissues and abdominal wall are unremarkable. Musculoskeletal: No suspicious osseous lesions. There are mild multilevel degenerative  changes in the visualized spine. IMPRESSION: 1. There is new heterogeneous appearance of the pancreas which appears swollen in comparison to the prior exam from 04/03/2023. There is mild fat stranding surrounding the pancreas (asymmetrically involving the head/uncinate process), which is also new. Findings favor interstitial pancreatitis. No walled-off fluid collection. 2. There is new mild intrahepatic bile duct dilation. There is also increased dilation of the proximal extrahepatic bile duct measuring up to 1.3 cm however, it gradually tapers in the middle third portion and completely obliterated in the distal third portion. No calcified gallstones. There is increased dilation of main pancreatic duct measuring up to 8 mm. However, no obstructing mass seen. If there is clinical concern for biliary obstruction, consider MRCP for further evaluation. 3. Multiple other nonacute observations, as described above. Aortic Atherosclerosis (ICD10-I70.0). Electronically Signed   By: Ree Molt M.D.   On: 03/29/2024 14:37    Family Communication: Discussed with patient, understand and agree. All questions answered.  Disposition: Status is: Inpatient Remains inpatient appropriate because: IV fluids, advance diet.  Planned Discharge Destination: Home     Time spent: 40 minutes  Author: Marien LITTIE Piety, MD 03/31/2024 7:53 AM Secure chat 7am to 7pm For on call review [email or web address].

## 2024-03-31 NOTE — Progress Notes (Signed)
 Initial Nutrition Assessment  DOCUMENTATION CODES:   Severe malnutrition in context of chronic illness, Underweight  INTERVENTION:   -Boost Breeze po TID, each supplement provides 250 kcal and 9 grams of protein   -Continue MVI, Thiamine  and folic acid  supplements  -Placed Pancreatitis Nutrition Therapy handout in AVS  NUTRITION DIAGNOSIS:   Severe Malnutrition related to chronic illness (pancreatitis) as evidenced by severe fat depletion, severe muscle depletion.  GOAL:   Patient will meet greater than or equal to 90% of their needs  MONITOR:   PO intake, Supplement acceptance  REASON FOR ASSESSMENT:   Consult Assessment of nutrition requirement/status  ASSESSMENT:   73 y.o. male with medical history significant of alcohol abuse, alcoholic pancreatitis, pancreatic cystic lesion, hypertension, prostate cancer status post radioactive seed implant, tobacco abuse, GERD, arthritis, chronic hyponatremia presenting with a chief complaint of epigastric abdominal pain.  Patient in room, states feels hungry. Has not had anything but clear liquids the past couple of days. States he typically consumes 2 meals a day. H/o alcohol use a week ago. Reviewed foods to avoid when dealing with pancreatitis. Pt with Boost Breeze at bedside, drank ~25% of it. Will continue these. States he drinks Boost at home. Denies any chewing or swallowing issues.  Patient reports UBW ~150 lbs. Current weight 134 lbs  Medications: Folic acid , Multivitamin with minerals daily, Miralax , KLOR-CON , Thiamine   Labs reviewed.  NUTRITION - FOCUSED PHYSICAL EXAM:  Flowsheet Row Most Recent Value  Orbital Region Severe depletion  Upper Arm Region Severe depletion  Thoracic and Lumbar Region Severe depletion  Buccal Region Severe depletion  Temple Region Severe depletion  Clavicle Bone Region Severe depletion  Clavicle and Acromion Bone Region Severe depletion  Scapular Bone Region Severe depletion   Dorsal Hand Severe depletion  Patellar Region Severe depletion  Anterior Thigh Region Severe depletion  Posterior Calf Region Severe depletion  Edema (RD Assessment) None  Hair Reviewed  Eyes Reviewed  Mouth Reviewed  [missing teeth]  Skin Reviewed  Nails Reviewed    Diet Order:   Diet Order             Diet regular Room service appropriate? Yes; Fluid consistency: Thin  Diet effective now                   EDUCATION NEEDS:   Education needs have been addressed  Skin:  Skin Assessment: Reviewed RN Assessment  Last BM:  8/4  Height:   Ht Readings from Last 1 Encounters:  03/30/24 6' 2 (1.88 m)    Weight:   Wt Readings from Last 1 Encounters:  03/30/24 61.2 kg    BMI:  Body mass index is 17.32 kg/m.  Estimated Nutritional Needs:   Kcal:  2200-2400  Protein:  110-125g  Fluid:  2.2L/day   Morna Lee, MS, RD, LDN Inpatient Clinical Dietitian Contact via Secure chat

## 2024-03-31 NOTE — Plan of Care (Signed)
  Problem: Education: Goal: Knowledge of General Education information will improve Description: Including pain rating scale, medication(s)/side effects and non-pharmacologic comfort measures Outcome: Progressing   Problem: Health Behavior/Discharge Planning: Goal: Ability to manage health-related needs will improve Outcome: Progressing   Problem: Clinical Measurements: Goal: Ability to maintain clinical measurements within normal limits will improve Outcome: Progressing Goal: Will remain free from infection Outcome: Progressing Goal: Diagnostic test results will improve Outcome: Progressing Goal: Respiratory complications will improve Outcome: Progressing Goal: Cardiovascular complication will be avoided Outcome: Progressing   Problem: Activity: Goal: Risk for activity intolerance will decrease Outcome: Progressing   Problem: Nutrition: Goal: Adequate nutrition will be maintained Outcome: Progressing   Problem: Coping: Goal: Level of anxiety will decrease Outcome: Progressing   Problem: Elimination: Goal: Will not experience complications related to bowel motility Outcome: Progressing Goal: Will not experience complications related to urinary retention Outcome: Progressing   Problem: Pain Managment: Goal: General experience of comfort will improve and/or be controlled Outcome: Progressing   Problem: Safety: Goal: Ability to remain free from injury will improve Outcome: Progressing   Problem: Skin Integrity: Goal: Risk for impaired skin integrity will decrease Outcome: Progressing   Problem: Education: Goal: Knowledge of Pancreatitis treatment and prevention will improve Outcome: Progressing   Problem: Health Behavior/Discharge Planning: Goal: Ability to formulate a plan to maintain an alcohol-free life will improve Outcome: Progressing   Problem: Nutritional: Goal: Ability to achieve adequate nutritional intake will improve Outcome: Progressing    Problem: Clinical Measurements: Goal: Complications related to the disease process, condition or treatment will be avoided or minimized Outcome: Progressing

## 2024-03-31 NOTE — TOC Initial Note (Signed)
 Transition of Care [facility]) - Initial/Assessment Note    Patient Details  Name: Logan Chambers MRN: [id number] Date of Birth: Jul 25, 1951  Transition of Care [facility]) CM/SW Contact:    Heather DELENA Saltness, LCSW Phone Number: 03/31/2024, 10:33 AM  Clinical Narrative:                 Pt from home. Substance/Alcohol use resources added to AVS. TOC will continue to follow for discharge needs.   Expected Discharge Plan: Home/Self Care Barriers to Discharge: Continued Medical Work up   Patient Goals and CMS Choice Patient states their goals for this hospitalization and ongoing recovery are:: To return home     Expected Discharge Plan and Services In-house Referral: NA Discharge Planning Services: NA Post Acute Care Choice: NA Living arrangements for the past 2 months: Apartment                 DME Arranged: N/A DME Agency: NA       HH Arranged: NA HH Agency: NA        Prior Living Arrangements/Services Living arrangements for the past 2 months: Apartment Lives with:: Self Patient language and need for interpreter reviewed:: Yes Do you feel safe going back to the place where you live?: Yes      Need for Family Participation in Patient Care: No (Comment) Care giver support system in place?: No (comment)   Criminal Activity/Legal Involvement Pertinent to Current Situation/Hospitalization: No - Comment as needed  Activities of Daily Living   ADL Screening (condition at time of admission) Independently performs ADLs?: Yes (appropriate for developmental age) Is the patient deaf or have difficulty hearing?: No Does the patient have difficulty seeing, even when wearing glasses/contacts?: No Does the patient have difficulty concentrating, remembering, or making decisions?: No  Permission Sought/Granted   Permission granted to share information with : No    Emotional Assessment Appearance::  (UTA) Attitude/Demeanor/Rapport: Unable to Assess Affect (typically observed): Unable to  Assess Orientation: : Oriented to Self, Oriented to Place, Oriented to  Time, Oriented to Situation Alcohol / Substance Use: Alcohol Use Psych Involvement: No (comment)  Admission diagnosis:  Acute pancreatitis [K85.90] Hyponatremia [E87.1] Alcohol-induced acute pancreatitis, unspecified complication status [K85.20] Patient Active Problem List   Diagnosis Date Noted   Acute pancreatitis 03/29/2024   Hypertensive urgency 03/29/2024   Hypomagnesemia 03/29/2024   Malignant neoplasm of prostate (HCC) 11/11/2023   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) 03/05/2022   Constipation 03/03/2022   Mass of omentum 03/01/2022   Tobacco use 02/28/2022   Hyponatremia 02/28/2022   Normocytic anemia 02/28/2022   Abdominal pain 02/27/2022   Toe injury 02/27/2022   Alcohol use disorder 02/27/2022   Hx of adenomatous polyp of colon 06/12/2020   Plantar fasciitis 10/09/2018   PCP:  Larnell Hamilton, MD Pharmacy:   [id number] - [place], [place] - [address] AT [facility] [address] [place] [address] Phone: [phone] Fax: [phone]     Social Drivers of Health (SDOH) Social History: SDOH Screenings   Food Insecurity: No Food Insecurity (03/30/2024)  Housing: Low Risk  (03/30/2024)  Transportation Needs: No Transportation Needs (03/30/2024)  Utilities: Not At Risk (03/30/2024)  Alcohol Screen: Low Risk  (11/11/2023)  Depression (PHQ2-9): Low Risk  (11/11/2023)  Social Connections: Unknown (03/30/2024)  Tobacco Use: High Risk (03/29/2024)   SDOH Interventions: None indicated     Readmission Risk Interventions    03/31/2024   10:31 AM  Readmission Risk  Prevention Plan  Post Dischage Appt Complete  Medication Screening Complete  Transportation Screening Complete    Signed: Heather Saltness, MSW, LCSW Clinical Social Worker Inpatient Care Management 03/31/2024 10:34 AM

## 2024-04-01 ENCOUNTER — Other Ambulatory Visit (HOSPITAL_COMMUNITY): Payer: Self-pay

## 2024-04-01 DIAGNOSIS — E871 Hypo-osmolality and hyponatremia: Secondary | ICD-10-CM | POA: Diagnosis not present

## 2024-04-01 DIAGNOSIS — K852 Alcohol induced acute pancreatitis without necrosis or infection: Secondary | ICD-10-CM | POA: Diagnosis not present

## 2024-04-01 LAB — COMPREHENSIVE METABOLIC PANEL WITH GFR
ALT: 12 U/L (ref 0–44)
AST: 17 U/L (ref 15–41)
Albumin: 3.1 g/dL — ABNORMAL LOW (ref 3.5–5.0)
Alkaline Phosphatase: 140 U/L — ABNORMAL HIGH (ref 38–126)
Anion gap: 9 (ref 5–15)
BUN: 8 mg/dL (ref 8–23)
CO2: 22 mmol/L (ref 22–32)
Calcium: 8.7 mg/dL — ABNORMAL LOW (ref 8.9–10.3)
Chloride: 94 mmol/L — ABNORMAL LOW (ref 98–111)
Creatinine, Ser: 0.5 mg/dL — ABNORMAL LOW (ref 0.61–1.24)
GFR, Estimated: >60 mL/min (ref >60)
Glucose, Bld: 84 mg/dL (ref 70–99)
Potassium: 4 mmol/L (ref 3.5–5.1)
Sodium: 125 mmol/L — ABNORMAL LOW (ref 135–145)
Total Bilirubin: 0.6 mg/dL (ref 0.0–1.2)
Total Protein: 6.6 g/dL (ref 6.5–8.1)

## 2024-04-01 LAB — CBC
HCT: 29.6 % — ABNORMAL LOW (ref 39.0–52.0)
Hemoglobin: 10.2 g/dL — ABNORMAL LOW (ref 13.0–17.0)
MCH: 31.6 pg (ref 26.0–34.0)
MCHC: 34.5 g/dL (ref 30.0–36.0)
MCV: 91.6 fL (ref 80.0–100.0)
Platelets: 232 K/uL (ref 150–400)
RBC: 3.23 MIL/uL — ABNORMAL LOW (ref 4.22–5.81)
RDW: 15.7 % — ABNORMAL HIGH (ref 11.5–15.5)
WBC: 5.4 K/uL (ref 4.0–10.5)
nRBC: 0 % (ref 0.0–0.2)

## 2024-04-01 MED ORDER — IRBESARTAN 75 MG PO TABS
75.0000 mg | ORAL_TABLET | Freq: Every day | ORAL | 0 refills | Status: AC
  Filled 2024-04-01: qty 30, 30d supply, fill #0

## 2024-04-01 MED ORDER — VITAMIN B-1 100 MG PO TABS
100.0000 mg | ORAL_TABLET | Freq: Every day | ORAL | 0 refills | Status: AC
  Filled 2024-04-01: qty 30, 30d supply, fill #0

## 2024-04-01 MED ORDER — SODIUM CHLORIDE 1 G PO TABS
1.0000 g | ORAL_TABLET | Freq: Three times a day (TID) | ORAL | 0 refills | Status: AC
  Filled 2024-04-01: qty 21, 7d supply, fill #0

## 2024-04-01 MED ORDER — POLYETHYLENE GLYCOL 3350 17 GM/SCOOP PO POWD
17.0000 g | Freq: Every day | ORAL | 0 refills | Status: AC
  Filled 2024-04-01: qty 238, 14d supply, fill #0

## 2024-04-01 MED ORDER — FOLIC ACID 1 MG PO TABS
1.0000 mg | ORAL_TABLET | Freq: Every day | ORAL | 0 refills | Status: AC
  Filled 2024-04-01: qty 30, 30d supply, fill #0

## 2024-04-01 NOTE — Progress Notes (Incomplete)
 Progress Note Patient: Logan Chambers FMW:[id number] DOB: 1951-02-02 DOA: 03/29/2024     3 DOS: the patient was seen and examined on 04/01/2024   Brief hospital course: Nole Robey is a 73 y.o. male with medical history significant of alcohol abuse, alcoholic pancreatitis, pancreatic cystic lesion, hypertension, prostate cancer status post radioactive seed implant, tobacco abuse, GERD, arthritis, chronic hyponatremia presenting with a chief complaint of epigastric abdominal pain.  Labs showed Na 125, lipase 101, CT showing findings consistent with interstitial pancreatitis but no walled off fluid collection seen. Admitted to TRH service for acute alcoholic pancreatitis management.  Assessment and Plan: Acute on chronic alcoholic pancreatitis- pain has been well tolerated on fluids so will advance diet as tolerated today. No BM since admission. MRCP reviewed shows common bile duct stricture vs mass effect from pancreatitis. - encourage PO fluid intake - analgesia PRN - bowel regimen - adding on hepatic panel- direct bilirubin mildly elevated to 0.3, alk phos 147  Acute on Chronic Hyponatremia- 124 overnight Na at baseline 125-130 possibly in the setting of alcohol use. Continue to trend. - start salt tablets, CMP am  Hypokalemia- monitor and replace PRN  Hypertensive urgency- poorly controlled BP - starting irbesartan , titrate up as needed - continue amlodipine   Alcohol use disorder Last alcohol 1 week ago. discontinued CIWA protocol. Use Ativan  as needed.   Continue Thiamine , folate, and multivitamin.   Hypomagnesemia Improved with replacement. Continue to monitor daily electrolytes.  GERD Continue Protonix .  At risk for malnutrition BMI 17 Dietician consulted.  Out of bed to chair. Incentive spirometry. Nursing supportive care. Fall, aspiration precautions. Diet:  Diet Orders (From admission, onward)     Start     Ordered   03/31/24 1019  Diet regular Room service  appropriate? Yes; Fluid consistency: Thin  Diet effective now       Question Answer Comment  Room service appropriate? Yes   Fluid consistency: Thin      03/31/24 1018           DVT prophylaxis: SCDs Start: 03/29/24 2250  Level of care: Telemetry   Code Status: Full Code  Subjective: Patient reports feeling better today. Has no nausea. Has not had BM in 2 days.   Physical Exam: Vitals:   03/31/24 0913 03/31/24 1158 03/31/24 2021 04/01/24 0500  BP: (!) 148/79 123/74 135/80 128/72  Pulse: 74 69 72 63  Resp:  16 (!) 21 20  Temp:  98 F (36.7 C) 98.1 F (36.7 C) 97.9 F (36.6 C)  TempSrc:   Oral Oral  SpO2: 100% 100% 100% 100%  Weight:      Height:        General - Elderly, thin built male, no apparent distress HEENT - PERRLA, EOMI, atraumatic head, non tender sinuses. Lung - Clear, no rales, rhonchi, wheezes. Heart - S1, S2 heard, no murmurs, rubs, no pedal edema. Abdomen - Soft, non tender, bowel sounds good Neuro - Alert, awake and oriented x 3, non focal exam. Skin - Warm and dry.  Data Reviewed:    Latest Ref Rng & Units 04/01/2024    5:18 AM 03/30/2024    5:42 AM 03/29/2024   11:28 AM  CBC  WBC 4.0 - 10.5 K/uL 5.4  6.0  5.4   Hemoglobin 13.0 - 17.0 g/dL 89.7  88.1  87.5   Hematocrit 39.0 - 52.0 % 29.6  33.2  35.3   Platelets 150 - 400 K/uL 232  228  245  Latest Ref Rng & Units 04/01/2024    5:18 AM 03/30/2024    5:24 PM 03/30/2024    8:55 AM  BMP  Glucose 70 - 99 mg/dL 84  83  86   BUN 8 - 23 mg/dL 8  7  6    Creatinine 0.61 - 1.24 mg/dL 9.49  9.50  9.43   Sodium 135 - 145 mmol/L 125  124  126   Potassium 3.5 - 5.1 mmol/L 4.0  3.3  3.9   Chloride 98 - 111 mmol/L 94  91  94   CO2 22 - 32 mmol/L 22  21  23    Calcium 8.9 - 10.3 mg/dL 8.7  8.8  8.8    No results found.   Family Communication: Discussed with patient, understand and agree. All questions answered.  Disposition: Status is: Inpatient Remains inpatient appropriate because: IV fluids,  advance diet.  Planned Discharge Destination: Home  {Tip this will not be part of the note when signed  DVT Prophylaxis  ., Scds  (Optional):26781}   Time spent: 40 minutes  Author: Marien LITTIE Piety, MD 04/01/2024 7:46 AM Secure chat 7am to 7pm For on call review [email or web address].

## 2024-04-01 NOTE — Progress Notes (Signed)
 Discharge instructions were reviewed with the patient. He denied questions or concerns at this time. Pt encouraged to contact PCP office today to schedule follow up visit. IV removed, site is clean dry and intact. Telemetry box#52 was returned to the desk.

## 2024-04-01 NOTE — TOC Transition Note (Addendum)
 Transition of Care [facility]) - Discharge Note   Patient Details  Name: Logan Chambers MRN: [id number] Date of Birth: March 27, 1951  Transition of Care [facility]) CM/SW Contact:  Sonda Manuella Quill, RN Phone Number: 04/01/2024, 10:34 AM   Clinical Narrative:    D/C orders received; no TOC needs.  -[phone]- notified pt requests taxi voucher; spoke w/ pt in room; pt said he has money to pay for transportation; Shanice, CN notified, and she will assist pt w/ setting up transportation; no TOC needs.  Final next level of care: Home/Self Care Barriers to Discharge: No Barriers Identified   Patient Goals and CMS Choice Patient states their goals for this hospitalization and ongoing recovery are:: To return home          Discharge Placement                       Discharge Plan and Services Additional resources added to the After Visit Summary for   In-house Referral: NA Discharge Planning Services: NA Post Acute Care Choice: NA          DME Arranged: N/A DME Agency: NA       HH Arranged: NA HH Agency: NA        Social Drivers of Health (SDOH) Interventions SDOH Screenings   Food Insecurity: No Food Insecurity (03/30/2024)  Housing: Low Risk  (03/30/2024)  Transportation Needs: No Transportation Needs (03/30/2024)  Utilities: Not At Risk (03/30/2024)  Alcohol Screen: Low Risk  (11/11/2023)  Depression (PHQ2-9): Low Risk  (11/11/2023)  Social Connections: Unknown (03/30/2024)  Tobacco Use: High Risk (03/29/2024)     Readmission Risk Interventions    03/31/2024   10:31 AM  Readmission Risk Prevention Plan  Post Dischage Appt Complete  Medication Screening Complete  Transportation Screening Complete

## 2024-04-01 NOTE — Progress Notes (Signed)
 Discharge mediations delivered to patient at bedside [facility]

## 2024-04-01 NOTE — Plan of Care (Signed)
  Problem: Education: Goal: Knowledge of General Education information will improve Description: Including pain rating scale, medication(s)/side effects and non-pharmacologic comfort measures Outcome: Progressing   Problem: Clinical Measurements: Goal: Ability to maintain clinical measurements within normal limits will improve Outcome: Progressing   Problem: Clinical Measurements: Goal: Will remain free from infection Outcome: Progressing   Problem: Clinical Measurements: Goal: Respiratory complications will improve Outcome: Progressing   

## 2024-04-01 NOTE — Discharge Summary (Signed)
 Physician Discharge Summary  Patient: Logan Chambers FMW:[id number] DOB: September 22, 1950   Code Status: Full Code Admit date: 03/29/2024 Discharge date: 04/01/2024 Disposition: Home, No home health services recommended PCP: Larnell Hamilton, MD  Recommendations for Outpatient Follow-up:  Follow up with PCP within 1-2 weeks Regarding general hospital follow up and preventative care Recommend CBC, BMP. Na+ 125 on day of dc which appears to be his baseline in setting of chronic alcohol use disorder Started on irbesartan  with good BP control while inpatient  Discharge Diagnoses:  Principal Problem:   Acute pancreatitis Active Problems:   Alcohol use disorder   Hyponatremia   Hypertensive urgency   Hypomagnesemia   Protein-calorie malnutrition, severe  Brief Hospital Course Summary: Logan Chambers is a 73 y.o. male with medical history significant of alcohol dependence, alcoholic pancreatitis, pancreatic cystic lesion, hypertension, prostate cancer status post radioactive seed implant, tobacco abuse, GERD, arthritis, chronic hyponatremia presenting with a chief complaint of epigastric abdominal pain.  Labs showed Na 125, lipase 101, CT showing findings consistent with interstitial pancreatitis but no walled off fluid collection seen.  He was treated supportively with IVF, analgesia, and antiemetics. His abdominal pain resolved and he was able to tolerate a normal diet while inpatient.  With IVF, fluid restriction, alcohol cessation, and sodium tablets, his sodium only rose to high of 126 and was 125 on day of dc. Given that he is chronically hyponatremic, and asymptomatic, I think he is safe to dc with this result. He has endorsed that he will not continue alcohol intake and has been successful with cessation for 9 months previously. He received alcohol use disorder resources from the case manager at dc.  Also prescribed a week of salt tablets with meals. On follow up, would recommend further  serum/urine labs to determine alternative causes of hyponatremia if still remaining low after alcohol cessation. Results would likely not be revealing in acute setting here since he has already received IVF throughout his stay.  Thiamine  and folic acid  supplements were prescribed at dc.   Additionally, patient was hypertensive inpatient which greatly improved with irbesartan . He tolerated the new medication well and was continued at dc. May be titrated up if needed on follow up.   All other chronic conditions were treated with home medications.    Discharge Condition: Good, improved Recommended discharge diet: Regular healthy diet  Consultations: None   Procedures/Studies: None  Discharge Instructions     Discharge patient   Complete by: As directed    Discharge disposition: 01-Home or Self Care   Discharge patient date: 04/01/2024      Allergies as of 04/01/2024       Reactions   Lisinopril Rash        Medication List     STOP taking these medications    tamsulosin  0.4 MG Caps capsule Commonly known as: FLOMAX        TAKE these medications    aspirin EC 81 MG tablet 1 tablet Orally Once a day   folic acid  1 MG tablet Commonly known as: FOLVITE  Take 1 tablet (1 mg total) by mouth daily. Start taking on: April 02, 2024   irbesartan  75 MG tablet Commonly known as: AVAPRO  Take 1 tablet (75 mg total) by mouth daily. Start taking on: April 02, 2024   latanoprost  0.005 % ophthalmic solution Commonly known as: XALATAN  Place 1 drop into both eyes at bedtime.   pantoprazole  40 MG tablet Commonly known as: Protonix  Take 1 tablet (40 mg total) by mouth  daily.   polyethylene glycol 17 g packet Commonly known as: MIRALAX  / GLYCOLAX  Take 17 g by mouth daily.   sodium chloride  1 g tablet Take 1 tablet (1 g total) by mouth 3 (three) times daily with meals for 7 days.   thiamine  100 MG tablet Commonly known as: Vitamin B-1 Take 1 tablet (100 mg total) by mouth  daily. Start taking on: April 02, 2024   vitamin B-12 500 MCG tablet Commonly known as: CYANOCOBALAMIN Take 5,000 mcg by mouth daily.        Follow-up Information     Larnell Hamilton, MD.   Specialty: Internal Medicine Contact information: [address] [place] [address] [phone]                 Subjective   Pt reports feeling well. Denies abdominal pain. Tolerating a normal diet. No orthostatic symptoms reported.   All questions and concerns were addressed at time of discharge.  Objective  Blood pressure 118/67, pulse 63, temperature 97.9 F (36.6 C), temperature source Oral, resp. rate 20, height 6' 2 (1.88 m), weight 61.2 kg, SpO2 100%.   General: Pt is alert, awake, not in acute distress Cardiovascular: RRR, S1/S2 +, no rubs, no gallops Respiratory: CTA bilaterally, no wheezing, no rhonchi Abdominal: Soft, NT, ND, bowel sounds + Extremities: no edema, no cyanosis  The results of significant diagnostics from this hospitalization (including imaging, microbiology, ancillary and laboratory) are listed below for reference.   Imaging studies: MR 3D Recon At Scanner Result Date: 03/30/2024 CLINICAL DATA:  Biliary dilatation and pancreatitis. EXAM: MRI ABDOMEN WITHOUT AND WITH CONTRAST (INCLUDING MRCP) TECHNIQUE: Multiplanar multisequence MR imaging of the abdomen was performed both before and after the administration of intravenous contrast. Heavily T2-weighted images of the biliary and pancreatic ducts were obtained, and three-dimensional MRCP images were rendered by post processing. CONTRAST:  6mL GADAVIST  GADOBUTROL  1 MMOL/ML IV SOLN COMPARISON:  Abdomen and pelvis CT 03/29/2024. FINDINGS: This exam is markedly limited by poor signal to noise ratio. Imaging of the pancreas and kidneys is nondiagnostic on some pulse sequences. Lower chest: No acute findings. Hepatobiliary: No suspicious focal abnormality within the liver parenchyma. Intrahepatic biliary duct  dilatation evident. Common duct measures up to 7 mm diameter in the hepatoduodenal ligament. Common bile duct is 11 mm as it enters the head of the pancreas and then abruptly but smoothly tapers or beaks in the pancreatic head (see coronal T2 haste image 12 of series 7). No obstructing stone evident. Gallbladder is distended without discernible stone disease. No gallbladder wall thickening. Trace pericholecystic fluid towards the fundus. Pancreas: Pancreas is diffusely heterogeneous with diffuse dilatation of the main pancreatic duct and side branches throughout the pancreatic parenchyma. As noted above, evaluation of pancreas is nondiagnostic on some pulse sequences. Pancreatic head appears enlarged and heterogeneous. Spleen: Unremarkable on T2 imaging. Postcontrast imaging is nondiagnostic. Adrenals/Urinary Tract: No adrenal nodule or mass on T2 imaging. No gross renal mass lesion evident on T2 imaging. No hydronephrosis. Postcontrast imaging of the kidneys is nondiagnostic. Stomach/Bowel: Stomach is nondistended. Colon is diffusely distended with gas and stool. Vascular/Lymphatic: No abdominal aortic aneurysm. No discernible lymphadenopathy in the abdomen. Other:  No substantial intraperitoneal free fluid. Musculoskeletal: Imaging of the visualized thoracolumbar spine on postcontrast imaging is nondiagnostic. IMPRESSION: 1. Markedly limited study due to poor signal to noise ratio multiple pulse sequences including post-contrast imaging. Imaging of the pancreas and kidneys is nondiagnostic on some pulse sequences. 2. Intrahepatic and extrahepatic biliary duct dilatation  with abrupt but smooth tapering of the common bile duct in the pancreatic head. No obstructing stone evident. No definite discrete mass lesion in the head of the pancreas although assessment of the pancreas is nondiagnostic on most of the pulse sequences. Findings could be related to common bile duct stricture or mass-effect from the acute on  chronic pancreatic changes described on previous studies. Pancreatic head mass lesion cannot be excluded on this study. ERCP/endoscopic ultrasound may prove helpful to further evaluate. 3. Diffuse dilatation of the main pancreatic duct and side branches throughout the pancreatic parenchyma. Pancreatic head appears enlarged and heterogeneous. Imaging features are compatible with acute on chronic pancreatitis. Follow-up recommended to ensure resolution. 4. Distended gallbladder without discernible stone disease. Trace pericholecystic fluid towards the fundus. 5. Colon is diffusely distended with gas and stool. Electronically Signed   By: Camellia Candle M.D.   On: 03/30/2024 07:10   MR ABDOMEN MRCP W WO CONTAST Result Date: 03/30/2024 CLINICAL DATA:  Biliary dilatation and pancreatitis. EXAM: MRI ABDOMEN WITHOUT AND WITH CONTRAST (INCLUDING MRCP) TECHNIQUE: Multiplanar multisequence MR imaging of the abdomen was performed both before and after the administration of intravenous contrast. Heavily T2-weighted images of the biliary and pancreatic ducts were obtained, and three-dimensional MRCP images were rendered by post processing. CONTRAST:  6mL GADAVIST  GADOBUTROL  1 MMOL/ML IV SOLN COMPARISON:  Abdomen and pelvis CT 03/29/2024. FINDINGS: This exam is markedly limited by poor signal to noise ratio. Imaging of the pancreas and kidneys is nondiagnostic on some pulse sequences. Lower chest: No acute findings. Hepatobiliary: No suspicious focal abnormality within the liver parenchyma. Intrahepatic biliary duct dilatation evident. Common duct measures up to 7 mm diameter in the hepatoduodenal ligament. Common bile duct is 11 mm as it enters the head of the pancreas and then abruptly but smoothly tapers or beaks in the pancreatic head (see coronal T2 haste image 12 of series 7). No obstructing stone evident. Gallbladder is distended without discernible stone disease. No gallbladder wall thickening. Trace pericholecystic  fluid towards the fundus. Pancreas: Pancreas is diffusely heterogeneous with diffuse dilatation of the main pancreatic duct and side branches throughout the pancreatic parenchyma. As noted above, evaluation of pancreas is nondiagnostic on some pulse sequences. Pancreatic head appears enlarged and heterogeneous. Spleen: Unremarkable on T2 imaging. Postcontrast imaging is nondiagnostic. Adrenals/Urinary Tract: No adrenal nodule or mass on T2 imaging. No gross renal mass lesion evident on T2 imaging. No hydronephrosis. Postcontrast imaging of the kidneys is nondiagnostic. Stomach/Bowel: Stomach is nondistended. Colon is diffusely distended with gas and stool. Vascular/Lymphatic: No abdominal aortic aneurysm. No discernible lymphadenopathy in the abdomen. Other:  No substantial intraperitoneal free fluid. Musculoskeletal: Imaging of the visualized thoracolumbar spine on postcontrast imaging is nondiagnostic. IMPRESSION: 1. Markedly limited study due to poor signal to noise ratio multiple pulse sequences including post-contrast imaging. Imaging of the pancreas and kidneys is nondiagnostic on some pulse sequences. 2. Intrahepatic and extrahepatic biliary duct dilatation with abrupt but smooth tapering of the common bile duct in the pancreatic head. No obstructing stone evident. No definite discrete mass lesion in the head of the pancreas although assessment of the pancreas is nondiagnostic on most of the pulse sequences. Findings could be related to common bile duct stricture or mass-effect from the acute on chronic pancreatic changes described on previous studies. Pancreatic head mass lesion cannot be excluded on this study. ERCP/endoscopic ultrasound may prove helpful to further evaluate. 3. Diffuse dilatation of the main pancreatic duct and side branches throughout the pancreatic parenchyma. Pancreatic  head appears enlarged and heterogeneous. Imaging features are compatible with acute on chronic pancreatitis. Follow-up  recommended to ensure resolution. 4. Distended gallbladder without discernible stone disease. Trace pericholecystic fluid towards the fundus. 5. Colon is diffusely distended with gas and stool. Electronically Signed   By: Camellia Candle M.D.   On: 03/30/2024 05:36   CT ABDOMEN PELVIS W CONTRAST Result Date: 03/29/2024 CLINICAL DATA:  epigastric pain. elevated lipase. Eval for pancreatitis or other pathology. EXAM: CT ABDOMEN AND PELVIS WITH CONTRAST TECHNIQUE: Multidetector CT imaging of the abdomen and pelvis was performed using the standard protocol following bolus administration of intravenous contrast. RADIATION DOSE REDUCTION: This exam was performed according to the departmental dose-optimization program which includes automated exposure control, adjustment of the mA and/or kV according to patient size and/or use of iterative reconstruction technique. CONTRAST:  75mL OMNIPAQUE  IOHEXOL  350 MG/ML SOLN COMPARISON:  CT scan abdomen from 04/03/2023. FINDINGS: Lower chest: There are emphysematous changes in the visualized lung bases. The lung bases are otherwise clear. No pleural effusion. The heart is normal in size. No pericardial effusion. Hepatobiliary: The liver is normal in size. Non-cirrhotic configuration. No suspicious mass. Mild intrahepatic bile duct dilation, increased since the prior study. There is mildly dilated proximal extrahepatic bile duct measuring up to 1.3 cm however, it gradually tapers in the middle third portion and completely obliterated in the distal third portion. No calcified gallstones. Normal gallbladder wall thickness. No pericholecystic inflammatory changes. Pancreas: There is new heterogeneous appearance of the pancreas which appears swollen in comparison to the prior exam from 04/03/2023. There is mild fat stranding surrounding the pancreas (asymmetrically involving the head/uncinate process), which is also new. Findings favor interstitial pancreatitis. Redemonstration of dilation  of main pancreatic duct measuring up to 8 mm in the pancreatic neck region, increased since the prior study. However, no discrete obstructing mass seen. Spleen: Within normal limits. No focal lesion. Adrenals/Urinary Tract: Adrenal glands are unremarkable. No suspicious renal mass. No hydronephrosis. No renal or ureteric calculi. Urinary bladder is under distended, precluding optimal assessment. However, no large mass or stones identified. No perivesical fat stranding. Stomach/Bowel: No disproportionate dilation of the small or large bowel loops. No evidence of abnormal bowel wall thickening or inflammatory changes. The appendix was not visualized; however there is no acute inflammatory process in the right lower quadrant. Vascular/Lymphatic: There is trace amount of ascites in the dependent pelvis. No walled-off abscess. No pneumoperitoneum. No abdominal or pelvic lymphadenopathy, by size criteria. No aneurysmal dilation of the major abdominal arteries. There are marked peripheral atherosclerotic vascular calcifications of the aorta and its major branches. Reproductive: Normal-sized prostate gland. Multiple prostatic radiation seeds noted. Symmetric bilateral seminal vesicles. Other: The visualized soft tissues and abdominal wall are unremarkable. Musculoskeletal: No suspicious osseous lesions. There are mild multilevel degenerative changes in the visualized spine. IMPRESSION: 1. There is new heterogeneous appearance of the pancreas which appears swollen in comparison to the prior exam from 04/03/2023. There is mild fat stranding surrounding the pancreas (asymmetrically involving the head/uncinate process), which is also new. Findings favor interstitial pancreatitis. No walled-off fluid collection. 2. There is new mild intrahepatic bile duct dilation. There is also increased dilation of the proximal extrahepatic bile duct measuring up to 1.3 cm however, it gradually tapers in the middle third portion and completely  obliterated in the distal third portion. No calcified gallstones. There is increased dilation of main pancreatic duct measuring up to 8 mm. However, no obstructing mass seen. If there is clinical concern for  biliary obstruction, consider MRCP for further evaluation. 3. Multiple other nonacute observations, as described above. Aortic Atherosclerosis (ICD10-I70.0). Electronically Signed   By: Ree Molt M.D.   On: 03/29/2024 14:37    Labs: Basic Metabolic Panel: Recent Labs  Lab 03/29/24 1128 03/29/24 1556 03/30/24 0542 03/30/24 0855 03/30/24 1724 04/01/24 0518  NA 125* 124* 125* 126* 124* 125*  K 4.2 4.2 4.1 3.9 3.3* 4.0  CL 89* 90* 91* 94* 91* 94*  CO2 23 22 23 23  21* 22  GLUCOSE 105* 78 89 86 83 84  BUN 7* 8 6* 6* 7* 8  CREATININE 0.70 0.57* 0.57* 0.56* 0.49* 0.50*  CALCIUM 9.5 8.9 8.8* 8.8* 8.8* 8.7*  MG 1.6*  --  2.2  --   --   --    CBC: Recent Labs  Lab 03/29/24 1128 03/30/24 0542 04/01/24 0518  WBC 5.4 6.0 5.4  HGB 12.4* 11.8* 10.2*  HCT 35.3* 33.2* 29.6*  MCV 91.0 90.0 91.6  PLT 245 228 232   Microbiology: Results for orders placed or performed during the hospital encounter of 02/27/22  Culture, body fluid w Gram Stain-bottle     Status: None   Collection Time: 02/28/22  9:41 AM   Specimen: Peritoneal Washings  Result Value Ref Range Status   Specimen Description PERITONEAL  Final   Special Requests NONE  Final   Culture   Final    NO GROWTH 5 DAYS Performed at [facility], [address] [place], [place] [address]    Report Status 03/05/2022 FINAL  Final  Gram stain     Status: None   Collection Time: 02/28/22  9:41 AM   Specimen: Peritoneal Washings  Result Value Ref Range Status   Specimen Description PERITONEAL  Final   Special Requests NONE  Final   Gram Stain   Final    WBC PRESENT,BOTH PMN AND MONONUCLEAR NO ORGANISMS SEEN CYTOSPIN SMEAR Performed at [facility], [address], [place], [place] [address]    Report Status  02/28/2022 FINAL  Final    Time coordinating discharge: Over 30 minutes  Marien LITTIE Piety, MD  [facility] 04/01/2024, 10:03 AM

## 2024-04-07 ENCOUNTER — Institutional Professional Consult (permissible substitution) (HOSPITAL_BASED_OUTPATIENT_CLINIC_OR_DEPARTMENT_OTHER): Admitting: Nurse Practitioner

## 2024-04-26 NOTE — Progress Notes (Unsigned)
 [facility] [place]  ASSESSMENT / PLAN: Logan Chambers is a 73 y.o. 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  Patient counseled patients with asymptomatic peripheral arterial disease or claudication have a 1-2% risk of developing chronic limb threatening ischemia, but a 15-30% risk of mortality in the next 5 years. Intervention should only be considered for medically optimized patients with disabling symptoms.   Recommend:  Abstinence from all tobacco products. Blood glucose control with goal A1c < 7%. Blood pressure control with goal blood pressure < 140/90 mmHg. Lipid reduction therapy with goal LDL-C 55mg /dL Aspirin 81mg  PO QD.  Atorvastatin 40-80mg  PO QD (or other high intensity statin therapy). Daily walking  Follow up in 3 months to evaluate medical therapy for PAD.  CHIEF COMPLAINT: cramping pain with walking  HISTORY OF PRESENT ILLNESS: Logan Chambers is a 73 y.o. male referred to clinic for evaluation of cramping pain with walking.  The patient reports fairly typical symptoms of intermittent claudication in the right calf.  He reports pain begins after about a block.  The pain is reliably relieved with rest.  He does not have any symptoms of ischemic rest pain.  He has no ulcers about his feet.  He is a lifelong smoker.  He is trying to quit.  04/27/24: Returns to clinic for surveillance of claudication symptoms.  He is doing well overall.  He reports claudication symptoms are not very bothersome to him.  They will only occasionally bother him while he is walking.  No rest pain symptoms.  No ulcers about his feet.   Past Medical History:  Diagnosis Date   Alcohol-induced pancreatitis    Arm fracture, left    Arthritis    Cancer (HCC)    Hx of adenomatous polyp of colon 06/12/2020   Hypertension    Normocytic anemia    Pancreatic mass-cystic    Plantar fasciitis     Past Surgical  History:  Procedure Laterality Date   BIOPSY  09/09/2022   Procedure: BIOPSY;  Surgeon: Wilhelmenia Aloha Raddle., MD;  Location: [facility];  Service: Gastroenterology;;   ESOPHAGOGASTRODUODENOSCOPY N/A 09/09/2022   Procedure: ESOPHAGOGASTRODUODENOSCOPY (EGD);  Surgeon: Wilhelmenia Aloha Raddle., MD;  Location: [facility];  Service: Gastroenterology;  Laterality: N/A;   EUS N/A 09/09/2022   Procedure: UPPER ENDOSCOPIC ULTRASOUND (EUS) RADIAL;  Surgeon: Wilhelmenia Aloha Raddle., MD;  Location: [facility];  Service: Gastroenterology;  Laterality: N/A;   FINE NEEDLE ASPIRATION N/A 09/09/2022   Procedure: FINE NEEDLE ASPIRATION (FNA) LINEAR;  Surgeon: Wilhelmenia Aloha Raddle., MD;  Location: [facility];  Service: Gastroenterology;  Laterality: N/A;   HOT HEMOSTASIS N/A 09/09/2022   Procedure: HOT HEMOSTASIS (ARGON PLASMA COAGULATION/BICAP);  Surgeon: Wilhelmenia Aloha Raddle., MD;  Location: [facility];  Service: Gastroenterology;  Laterality: N/A;   IR PARACENTESIS  02/28/2022   PROSTATE BIOPSY     RADIOACTIVE SEED IMPLANT N/A 12/26/2023   Procedure: INSERTION, RADIATION SOURCE, PROSTATE;  Surgeon: Cam Morene ORN, MD;  Location: [facility];  Service: Urology;  Laterality: N/A;  RADIOACTIVE SEED IMPLANT AND SPACE OAR   SPACE OAR INSTILLATION N/A 12/26/2023   Procedure: INJECTION, HYDROGEL SPACER;  Surgeon: Cam Morene ORN, MD;  Location: [facility];  Service: Urology;  Laterality: N/A;    Family History  Problem Relation Age of Onset   Colon cancer Neg Hx    Rectal cancer Neg Hx    Stomach cancer Neg Hx    Pancreatic cancer Neg Hx  Liver disease Neg Hx     Social History   Socioeconomic History   Marital status: Single    Spouse name: Not on file   Number of children: 2   Years of education: Not on file   Highest education level: Not on file  Occupational History   Not on file  Tobacco Use   Smoking status: Every Day    Current packs/day: 1.00    Average packs/day: 1 pack/day  for 50.2 years (50.2 ttl pk-yrs)    Types: Cigarettes    Start date: 02/02/1974    Passive exposure: Never   Smokeless tobacco: Never   Tobacco comments:    Since 02/27/22  Vaping Use   Vaping status: Never Used  Substance and Sexual Activity   Alcohol use: Yes    Alcohol/week: 4.0 - 5.0 standard drinks of alcohol    Types: 4 - 5 Cans of beer per week    Comment: 2 40 oz per day    Drug use: No   Sexual activity: Not on file  Other Topics Concern   Not on file  Social History Narrative   The patient lives alone, he has 2 supportive daughters in [place]   Former significant alcohol use and smoking history stopped 2023   Social Drivers of Health   Financial Resource Strain: Not on file  Food Insecurity: No Food Insecurity (03/30/2024)   Hunger Vital Sign    Worried About Running Out of Food in the Last Year: Never true    Ran Out of Food in the Last Year: Never true  Transportation Needs: No Transportation Needs (03/30/2024)   PRAPARE - Administrator, Civil Service (Medical): No    Lack of Transportation (Non-Medical): No  Physical Activity: Not on file  Stress: Not on file  Social Connections: Unknown (03/30/2024)   Social Connection and Isolation Panel    Frequency of Communication with Friends and Family: Twice a week    Frequency of Social Gatherings with Friends and Family: More than three times a week    Attends Religious Services: 1 to 4 times per year    Active Member of [employer] or Organizations: No    Attends Engineer, structural: More than 4 times per year    Marital Status: Patient declined  Intimate Partner Violence: Not At Risk (03/30/2024)   Humiliation, Afraid, Rape, and Kick questionnaire    Fear of Current or Ex-Partner: No    Emotionally Abused: No    Physically Abused: No    Sexually Abused: No    Allergies  Allergen Reactions   Lisinopril Rash    Current Outpatient Medications  Medication Sig Dispense Refill   aspirin EC 81 MG  tablet 1 tablet Orally Once a day     folic acid  (FOLVITE ) 1 MG tablet Take 1 tablet (1 mg total) by mouth daily. 30 tablet 0   irbesartan  (AVAPRO ) 75 MG tablet Take 1 tablet (75 mg total) by mouth daily. 60 tablet 0   latanoprost  (XALATAN ) 0.005 % ophthalmic solution Place 1 drop into both eyes at bedtime.     pantoprazole  (PROTONIX ) 40 MG tablet Take 1 tablet (40 mg total) by mouth daily. 30 tablet 1   polyethylene glycol powder (GLYCOLAX /MIRALAX ) 17 GM/SCOOP powder Take 17 grams dissolved in liquid by mouth daily. 238 g 0   thiamine  (VITAMIN B-1) 100 MG tablet Take 1 tablet (100 mg total) by mouth daily. 30 tablet 0   vitamin B-12 (CYANOCOBALAMIN) 500  MCG tablet Take 5,000 mcg by mouth daily.     No current facility-administered medications for this visit.    PHYSICAL EXAM Vitals:   04/27/24 0928  BP: 134/83  Pulse: (!) 58  Temp: 97.7 F (36.5 C)  SpO2: 98%  Weight: 130 lb (59 kg)  Height: 6' 2 (1.88 m)    Chronically ill older man in no distress Regular rate and rhythm Unlabored breathing No palpable pedal pulses Ischemic ulcers about the feet  PERTINENT LABORATORY AND RADIOLOGIC DATA  Most recent CBC    Latest Ref Rng & Units 04/01/2024    5:18 AM 03/30/2024    5:42 AM 03/29/2024   11:28 AM  CBC  WBC 4.0 - 10.5 K/uL 5.4  6.0  5.4   Hemoglobin 13.0 - 17.0 g/dL 89.7  88.1  87.5   Hematocrit 39.0 - 52.0 % 29.6  33.2  35.3   Platelets 150 - 400 K/uL 232  228  245      Most recent CMP    Latest Ref Rng & Units 04/01/2024    5:18 AM 03/31/2024    9:48 AM 03/30/2024    5:24 PM  CMP  Glucose 70 - 99 mg/dL 84   83   BUN 8 - 23 mg/dL 8   7   Creatinine 9.38 - 1.24 mg/dL 9.49   9.50   Sodium 864 - 145 mmol/L 125   124   Potassium 3.5 - 5.1 mmol/L 4.0   3.3   Chloride 98 - 111 mmol/L 94   91   CO2 22 - 32 mmol/L 22   21   Calcium 8.9 - 10.3 mg/dL 8.7   8.8   Total Protein 6.5 - 8.1 g/dL 6.6  7.4    Total Bilirubin 0.0 - 1.2 mg/dL 0.6  1.0    Alkaline Phos 38 - 126 U/L 140   147    AST 15 - 41 U/L 17  22    ALT 0 - 44 U/L 12  11     +-------+-----------+-----------+------------+------------+  ABI/TBIToday's ABIToday's TBIPrevious ABIPrevious TBI  +-------+-----------+-----------+------------+------------+  Right 0.71       0.68                                 +-------+-----------+-----------+------------+------------+  Left  0.94       0.96                                 +-------+-----------+-----------+------------+------------+       Debby SAILOR. Magda, MD FACS [facility] Phone Number: [phone] 04/26/2024 5:02 PM   Total time spent on preparing this encounter including chart review, data review, collecting history, examining the patient, and coordinating care: 20 minutes  Portions of this report may have been transcribed using voice recognition software.  Every effort has been made to ensure accuracy; however, inadvertent computerized transcription errors may still be present.

## 2024-04-27 ENCOUNTER — Ambulatory Visit (HOSPITAL_COMMUNITY)
Admission: RE | Admit: 2024-04-27 | Discharge: 2024-04-27 | Disposition: A | Discharge status: Home / Self Care | Source: Ambulatory Visit | Attending: Vascular Surgery | Admitting: Vascular Surgery

## 2024-04-27 ENCOUNTER — Ambulatory Visit: Attending: Vascular Surgery | Admitting: Vascular Surgery

## 2024-04-27 ENCOUNTER — Encounter: Payer: Self-pay | Admitting: Vascular Surgery

## 2024-04-27 VITALS — BP 134/83 | HR 58 | Temp 97.7°F | Ht 74.0 in | Wt 130.0 lb

## 2024-04-27 DIAGNOSIS — I70211 Atherosclerosis of native arteries of extremities with intermittent claudication, right leg: Secondary | ICD-10-CM | POA: Diagnosis present

## 2024-04-27 DIAGNOSIS — I739 Peripheral vascular disease, unspecified: Secondary | ICD-10-CM | POA: Diagnosis not present

## 2024-04-27 LAB — VAS US ABI WITH/WO TBI
Left ABI: 0.94
Right ABI: 0.71
